# Patient Record
Sex: Male | Born: 1965 | Race: Black or African American | Hispanic: No | Smoking: Current every day smoker
Health system: Southern US, Community
[De-identification: ages and names within clinical notes are randomized; demographics above are authoritative.]

## PROBLEM LIST (undated history)

## (undated) DIAGNOSIS — I483 Typical atrial flutter: Secondary | ICD-10-CM

## (undated) DIAGNOSIS — Q211 Atrial septal defect, unspecified: Secondary | ICD-10-CM

## (undated) DIAGNOSIS — R011 Cardiac murmur, unspecified: Secondary | ICD-10-CM

## (undated) DIAGNOSIS — Z9289 Personal history of other medical treatment: Secondary | ICD-10-CM

## (undated) DIAGNOSIS — D1720 Benign lipomatous neoplasm of skin and subcutaneous tissue of unspecified limb: Secondary | ICD-10-CM

## (undated) DIAGNOSIS — I428 Other cardiomyopathies: Secondary | ICD-10-CM

## (undated) DIAGNOSIS — Q210 Ventricular septal defect: Secondary | ICD-10-CM

## (undated) DIAGNOSIS — Q249 Congenital malformation of heart, unspecified: Secondary | ICD-10-CM

## (undated) DIAGNOSIS — I059 Rheumatic mitral valve disease, unspecified: Secondary | ICD-10-CM

## (undated) DIAGNOSIS — I2510 Atherosclerotic heart disease of native coronary artery without angina pectoris: Secondary | ICD-10-CM

## (undated) DIAGNOSIS — D649 Anemia, unspecified: Secondary | ICD-10-CM

## (undated) DIAGNOSIS — R55 Syncope and collapse: Secondary | ICD-10-CM

## (undated) DIAGNOSIS — I00 Rheumatic fever without heart involvement: Secondary | ICD-10-CM

## (undated) DIAGNOSIS — I10 Essential (primary) hypertension: Secondary | ICD-10-CM

## (undated) DIAGNOSIS — I499 Cardiac arrhythmia, unspecified: Secondary | ICD-10-CM

## (undated) HISTORY — DX: Syncope and collapse: R55

## (undated) HISTORY — PX: CARDIAC VALVE REPLACEMENT: SHX585

## (undated) HISTORY — DX: Other cardiomyopathies: I42.8

## (undated) HISTORY — PX: CARDIAC SURGERY: SHX584

## (undated) HISTORY — DX: Anemia, unspecified: D64.9

## (undated) HISTORY — DX: Cardiac murmur, unspecified: R01.1

## (undated) HISTORY — PX: ASD AND VSD REPAIR: SHX257

## (undated) HISTORY — DX: Atrial septal defect: Q21.1

## (undated) HISTORY — DX: Rheumatic fever without heart involvement: I00

## (undated) HISTORY — DX: Atherosclerotic heart disease of native coronary artery without angina pectoris: I25.10

## (undated) HISTORY — DX: Congenital malformation of heart, unspecified: Q24.9

## (undated) HISTORY — DX: Benign lipomatous neoplasm of skin and subcutaneous tissue of unspecified limb: D17.20

## (undated) HISTORY — PX: MITRAL VALVE REPAIR: SHX2039

## (undated) HISTORY — DX: Ventricular septal defect: Q21.0

## (undated) HISTORY — DX: Typical atrial flutter: I48.3

## (undated) HISTORY — DX: Rheumatic mitral valve disease, unspecified: I05.9

## (undated) HISTORY — DX: Personal history of other medical treatment: Z92.89

## (undated) HISTORY — DX: Atrial septal defect, unspecified: Q21.10

---

## 1998-02-19 ENCOUNTER — Emergency Department (HOSPITAL_COMMUNITY): Admission: EM | Admit: 1998-02-19 | Discharge: 1998-02-19 | Payer: Self-pay | Admitting: Emergency Medicine

## 1998-02-21 ENCOUNTER — Encounter: Admission: RE | Admit: 1998-02-21 | Discharge: 1998-02-21 | Payer: Self-pay | Admitting: Internal Medicine

## 1998-02-25 ENCOUNTER — Ambulatory Visit (HOSPITAL_COMMUNITY): Admission: RE | Admit: 1998-02-25 | Discharge: 1998-02-25 | Payer: Self-pay

## 1998-02-27 ENCOUNTER — Encounter: Admission: RE | Admit: 1998-02-27 | Discharge: 1998-02-27 | Payer: Self-pay | Admitting: Internal Medicine

## 1999-01-09 ENCOUNTER — Ambulatory Visit (HOSPITAL_COMMUNITY): Admission: RE | Admit: 1999-01-09 | Discharge: 1999-01-09 | Payer: Self-pay | Admitting: Internal Medicine

## 1999-03-25 DIAGNOSIS — I059 Rheumatic mitral valve disease, unspecified: Secondary | ICD-10-CM

## 1999-03-25 HISTORY — DX: Rheumatic mitral valve disease, unspecified: I05.9

## 2004-05-17 ENCOUNTER — Emergency Department (HOSPITAL_COMMUNITY): Admission: EM | Admit: 2004-05-17 | Discharge: 2004-05-18 | Payer: Self-pay | Admitting: Emergency Medicine

## 2004-05-27 ENCOUNTER — Emergency Department (HOSPITAL_COMMUNITY): Admission: EM | Admit: 2004-05-27 | Discharge: 2004-05-27 | Payer: Self-pay | Admitting: Emergency Medicine

## 2004-06-16 ENCOUNTER — Ambulatory Visit (HOSPITAL_COMMUNITY): Admission: RE | Admit: 2004-06-16 | Discharge: 2004-06-16 | Payer: Self-pay | Admitting: Otolaryngology

## 2004-08-19 ENCOUNTER — Emergency Department (HOSPITAL_COMMUNITY): Admission: EM | Admit: 2004-08-19 | Discharge: 2004-08-19 | Payer: Self-pay | Admitting: Emergency Medicine

## 2005-01-25 ENCOUNTER — Emergency Department (HOSPITAL_COMMUNITY): Admission: EM | Admit: 2005-01-25 | Discharge: 2005-01-25 | Payer: Self-pay | Admitting: Emergency Medicine

## 2006-12-18 ENCOUNTER — Emergency Department (HOSPITAL_COMMUNITY): Admission: EM | Admit: 2006-12-18 | Discharge: 2006-12-18 | Payer: Self-pay | Admitting: Emergency Medicine

## 2010-01-25 ENCOUNTER — Emergency Department (HOSPITAL_COMMUNITY): Admission: EM | Admit: 2010-01-25 | Discharge: 2010-01-26 | Payer: Self-pay | Admitting: Emergency Medicine

## 2010-05-24 ENCOUNTER — Emergency Department (HOSPITAL_BASED_OUTPATIENT_CLINIC_OR_DEPARTMENT_OTHER)
Admission: EM | Admit: 2010-05-24 | Discharge: 2010-05-24 | Payer: Self-pay | Source: Home / Self Care | Admitting: Emergency Medicine

## 2010-05-24 ENCOUNTER — Ambulatory Visit: Payer: Self-pay | Admitting: Diagnostic Radiology

## 2010-08-01 ENCOUNTER — Emergency Department (HOSPITAL_COMMUNITY)
Admission: EM | Admit: 2010-08-01 | Discharge: 2010-08-01 | Payer: Self-pay | Source: Home / Self Care | Admitting: Emergency Medicine

## 2010-10-19 LAB — URINALYSIS, ROUTINE W REFLEX MICROSCOPIC
Bilirubin Urine: NEGATIVE
Glucose, UA: NEGATIVE mg/dL
Hgb urine dipstick: NEGATIVE
Ketones, ur: NEGATIVE mg/dL
Nitrite: NEGATIVE
Protein, ur: NEGATIVE mg/dL
Specific Gravity, Urine: 1.012 (ref 1.005–1.030)
Urobilinogen, UA: 0.2 mg/dL (ref 0.0–1.0)
pH: 5 (ref 5.0–8.0)

## 2012-03-11 ENCOUNTER — Encounter: Payer: Self-pay | Admitting: Internal Medicine

## 2012-03-11 DIAGNOSIS — Z0000 Encounter for general adult medical examination without abnormal findings: Secondary | ICD-10-CM | POA: Insufficient documentation

## 2012-03-17 ENCOUNTER — Encounter: Payer: Self-pay | Admitting: Internal Medicine

## 2012-03-17 ENCOUNTER — Ambulatory Visit (INDEPENDENT_AMBULATORY_CARE_PROVIDER_SITE_OTHER): Payer: Self-pay | Admitting: Internal Medicine

## 2012-03-17 VITALS — BP 112/72 | HR 70 | Temp 97.5°F | Ht 69.0 in | Wt 142.2 lb

## 2012-03-17 DIAGNOSIS — R634 Abnormal weight loss: Secondary | ICD-10-CM

## 2012-03-17 DIAGNOSIS — Q211 Atrial septal defect: Secondary | ICD-10-CM | POA: Insufficient documentation

## 2012-03-17 DIAGNOSIS — Q210 Ventricular septal defect: Secondary | ICD-10-CM | POA: Insufficient documentation

## 2012-03-17 DIAGNOSIS — Q249 Congenital malformation of heart, unspecified: Secondary | ICD-10-CM

## 2012-03-17 DIAGNOSIS — I059 Rheumatic mitral valve disease, unspecified: Secondary | ICD-10-CM | POA: Insufficient documentation

## 2012-03-17 DIAGNOSIS — D1739 Benign lipomatous neoplasm of skin and subcutaneous tissue of other sites: Secondary | ICD-10-CM

## 2012-03-17 DIAGNOSIS — D1720 Benign lipomatous neoplasm of skin and subcutaneous tissue of unspecified limb: Secondary | ICD-10-CM

## 2012-03-17 DIAGNOSIS — R55 Syncope and collapse: Secondary | ICD-10-CM | POA: Insufficient documentation

## 2012-03-17 DIAGNOSIS — F17200 Nicotine dependence, unspecified, uncomplicated: Secondary | ICD-10-CM

## 2012-03-17 HISTORY — DX: Benign lipomatous neoplasm of skin and subcutaneous tissue of unspecified limb: D17.20

## 2012-03-17 NOTE — Patient Instructions (Addendum)
Your EKG was OK today Please go to XRAY in the Basement for the x-ray test at your convenience Please go to LAB in the Basement for the blood and/or urine tests to be done at your convenience Please return in 6 months, for further evaluation Please stop smoking

## 2012-03-17 NOTE — Assessment & Plan Note (Addendum)
ECG reviewed as per emr, ok to follow, will need echo but declines now due to cost

## 2012-03-17 NOTE — Progress Notes (Signed)
Subjective:    Patient ID: Terry Golden, male    DOB: 01/17/1966, 46 y.o.   MRN: [id number]  HPI Here as new pt;  C/o wt loss over 3-6 mo, usually wt over 150 but has lost to 142, and he and family concerned, and was told his heart may change in the future, and starting to have heart palpitations again, like had before 2000 surgury.  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  Father died of cardiac arrest just last month. Denies worsening depressive symptoms, suicidal ideation, or panic, has had some stress but not really needed tx before. Denies hyper or hypo thyroid symptoms such as voice, skin or hair change. Denies worsening reflux, dysphagia, abd pain, n/v, bowel change or blood, states "eat like a horse" and cant seem to gain wt.  Still smoking  - about 1/4-1/3 ppd, only occas ETOH, no other drug use.   Appetitie ok.   Past Medical History  Diagnosis Date  . Heart murmur   . Heart disease   . Rheumatic fever   . ASD (atrial septal defect)     large   . VSD (ventricular septal defect)     small  . Mitral valve disease   . Syncope     1999  . Congenital heart disease    Past Surgical History  Procedure Date  . Cardiac surgery 2000    primum ASD repair, repair of cleft mitral valve    reports that he has been smoking.  He has never used smokeless tobacco. He reports that he drinks alcohol. He reports that he does not use illicit drugs. family history includes Alcohol abuse in his other; Arthritis in his others; Diabetes in his sister; Heart disease in his others and sister; Hypertension in his other; Kidney disease in his other; Mental illness in his other and sister; and Stroke in his other. No Known Allergies No current outpatient prescriptions on file prior to visit.   Review of Systems Review of Systems  Constitutional: Negative for diaphoresis, activity change, appetite change.  HENT:  Negative for hearing loss, ear pain, facial swelling, mouth sores and neck stiffness.   Eyes: Negative for pain, redness and visual disturbance.  Respiratory: Negative for shortness of breath and wheezing.   Cardiovascular: Negative for chest pain and palpitations.  Gastrointestinal: Negative for diarrhea, blood in stool, abdominal distention and rectal pain.  Genitourinary: Negative for hematuria, flank pain and decreased urine volume.  Musculoskeletal: Negative for myalgias and joint swelling.  Skin: Negative for color change and wound.  Neurological: Negative for syncope and numbness.  Hematological: Negative for adenopathy.  Psychiatric/Behavioral: Negative for hallucinations, self-injury, decreased concentration and agitation.      Objective:   Physical Exam BP 112/72  Pulse 70  Temp 97.5 F (36.4 C) (Oral)  Ht 5\' 9"  (1.753 m)  Wt 142 lb 4 oz (64.524 kg)  BMI 21.01 kg/m2  SpO2 95% Physical Exam  VS noted Constitutional: Pt appears well-developed and well-nourished.  HENT: Head: Normocephalic.  Right Ear: External ear normal.  Left Ear: External ear normal.  Eyes: Conjunctivae and EOM are normal. Pupils are equal, round, and reactive to light.  Neck: Normal range of motion. Neck supple.  Cardiovascular: Normal rate and regular rhythm.   Pulmonary/Chest: Effort normal and breath sounds normal.  Abd:  Soft, NT, non-distended, + BS Neurological: Pt is alert. Not confused  Skin: Skin is  warm. No erythema.  Psychiatric: Pt behavior is normal. Thought content normal.     Assessment & Plan:

## 2012-03-19 NOTE — Assessment & Plan Note (Signed)
Unclear etiology, for labs and cxr,  to f/u any worsening symptoms or concerns

## 2012-03-19 NOTE — Assessment & Plan Note (Signed)
Noted right shoudler, benign,  to f/u any worsening symptoms or concerns

## 2012-03-19 NOTE — Assessment & Plan Note (Signed)
Urged to quit 

## 2012-04-12 ENCOUNTER — Emergency Department (HOSPITAL_COMMUNITY)
Admission: EM | Admit: 2012-04-12 | Discharge: 2012-04-12 | Disposition: A | Discharge status: Home / Self Care | Payer: Self-pay | Attending: Emergency Medicine | Admitting: Emergency Medicine

## 2012-04-12 ENCOUNTER — Encounter (HOSPITAL_COMMUNITY): Payer: Self-pay

## 2012-04-12 DIAGNOSIS — F17200 Nicotine dependence, unspecified, uncomplicated: Secondary | ICD-10-CM | POA: Insufficient documentation

## 2012-04-12 DIAGNOSIS — I059 Rheumatic mitral valve disease, unspecified: Secondary | ICD-10-CM | POA: Insufficient documentation

## 2012-04-12 DIAGNOSIS — H6120 Impacted cerumen, unspecified ear: Secondary | ICD-10-CM | POA: Insufficient documentation

## 2012-04-12 DIAGNOSIS — H6122 Impacted cerumen, left ear: Secondary | ICD-10-CM

## 2012-04-12 MED ORDER — DOCUSATE SODIUM 50 MG/5ML PO LIQD
50.0000 mg | Freq: Once | ORAL | Status: AC
  Administered 2012-04-12: 08:00:00 via ORAL
  Filled 2012-04-12: qty 10

## 2012-04-12 MED ORDER — ANTIPYRINE-BENZOCAINE 5.4-1.4 % OT SOLN
3.0000 [drp] | OTIC | Status: DC | PRN
  Administered 2012-04-12: 3 [drp] via OTIC
  Filled 2012-04-12: qty 10

## 2012-04-12 MED ORDER — NEOMYCIN-COLIST-HC-THONZONIUM 3.3-3-10-0.5 MG/ML OT SUSP
3.0000 [drp] | OTIC | Status: DC
  Administered 2012-04-12: 3 [drp] via OTIC
  Filled 2012-04-12: qty 5

## 2012-04-12 NOTE — ED Notes (Signed)
MD at bedside. 

## 2012-04-12 NOTE — ED Notes (Signed)
Describes noise as "water in ear that's clogged."

## 2012-04-12 NOTE — ED Notes (Signed)
Pt reports he got hit accidentally a car door at 1500. Pt hears "steady, static sound." Pt had pain around ear. Reports no discharge. AAOx4.

## 2012-04-12 NOTE — ED Notes (Signed)
Colace placed in lt ear.very painful to pt

## 2012-04-12 NOTE — ED Notes (Signed)
MD left bedside

## 2012-04-12 NOTE — ED Notes (Signed)
Pt states that he was struck in the left side of head by a car door yesterday afternoon when he was getting out of the car.  Pt states now he can not hear out of his left ear and states has "ringing" and "gushing"

## 2012-04-12 NOTE — ED Provider Notes (Signed)
History     CSN: [id number]  Arrival date & time 04/12/12  4782   First MD Initiated Contact with Patient 04/12/12 [phone]      Chief Complaint  Patient presents with  . Otalgia    (Consider location/radiation/quality/duration/timing/severity/associated sxs/prior treatment) HPI  Patient states yesterday he was shutting the car door and hit the left side of his head. He states a few hours later he felt like he had water in his ear and he could hear. He states he had mild pain earlier today but not now. He also has some tenderness to left side of his face. He denies any bleeding from his ear canal. He states he uses Q tip daily cleanings ears appear   Cardiologist [place]  Past Medical History  Diagnosis Date  . Heart murmur   . Heart disease   . Rheumatic fever   . ASD (atrial septal defect)     large   . VSD (ventricular septal defect)     small  . Mitral valve disease   . Syncope     1999  . Congenital heart disease   . 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 Cardiac surgery 2000    primum ASD repair, repair of cleft mitral valve    Family History  Problem Relation Age of Onset  . Diabetes Sister   . Mental illness Sister   . Heart disease Sister   . Arthritis Other   . Heart disease Other   . Hypertension Other   . Alcohol abuse Other   . Arthritis Other   . Heart disease Other   . Stroke Other   . Mental illness Other   . Kidney disease Other     History  Substance Use Topics  . Smoking status: Current Everyday Smoker  . Smokeless tobacco: Never Used  . Alcohol Use: Yes     occasional social   Employed   Review of Systems  All other systems reviewed and are negative.    Allergies  Tensilon  Home Medications  No current outpatient prescriptions on file.  BP 128/82  Pulse 92  Temp 98.4 F (36.9 C) (Oral)  Resp 20  SpO2 100%  Vital signs normal    Physical Exam  Nursing note and vitals  reviewed. Constitutional: He is oriented to person, place, and time. He appears well-developed and well-nourished.  Non-toxic appearance. He does not appear ill. No distress.  HENT:  Head: Normocephalic and atraumatic.  Nose: Nose normal. No mucosal edema or rhinorrhea.  Mouth/Throat: Oropharynx is clear and moist and mucous membranes are normal. No dental abscesses or uvula swelling.       Patient has a lot of wax in his right ear canal however I can't see the TM and it is normal. Patient's left TM is obscured by a cerumen impaction. He is tender to palpation over the left zygoma however there is no swelling, deformity or bruising seen.  Eyes: Conjunctivae and EOM are normal. Pupils are equal, round, and reactive to light.  Neck: Normal range of motion and full passive range of motion without pain. Neck supple.  Pulmonary/Chest: Effort normal. No respiratory distress. He has no rhonchi. He exhibits no crepitus.  Abdominal: Normal appearance and bowel sounds are normal.  Musculoskeletal: Normal range of motion. He exhibits no edema and no tenderness.       Moves all extremities well.   Neurological: He is  alert and oriented to person, place, and time. He has normal strength. No cranial nerve deficit.  Skin: Skin is warm, dry and intact. No rash noted. No erythema. No pallor.  Psychiatric: He has a normal mood and affect. His speech is normal and behavior is normal. His mood appears not anxious.    ED Course  Procedures (including critical care time)   Medications  antipyrine-benzocaine (AURALGAN) otic solution 3-4 drop (not administered)  neomycin-colistin-hydrocortisone-thonzonium (CORTISPORIN TC) otic suspension 3 drop (not administered)  docusate (COLACE) 50 MG/5ML liquid 50 mg (  Oral Given 04/12/12 0741)      Ears rechecked at 09 40. His left ear canal is now almost free of wax. There is an abrasion of the posterior ear canal probably from the irrigation. His TM is intact without  being opague, redness or fluid behind the eardrum.    1. Impacted cerumen of left ear    Plan discharge  Devoria Albe, MD, FACEP    MDM          Ward Givens, MD 04/12/12 [phone]

## 2012-05-15 ENCOUNTER — Encounter (HOSPITAL_COMMUNITY): Payer: Self-pay | Admitting: Emergency Medicine

## 2012-05-15 ENCOUNTER — Emergency Department (HOSPITAL_COMMUNITY)
Admission: EM | Admit: 2012-05-15 | Discharge: 2012-05-15 | Disposition: A | Discharge status: Home / Self Care | Payer: Self-pay | Attending: Emergency Medicine | Admitting: Emergency Medicine

## 2012-05-15 DIAGNOSIS — Q2111 Secundum atrial septal defect: Secondary | ICD-10-CM | POA: Insufficient documentation

## 2012-05-15 DIAGNOSIS — H7290 Unspecified perforation of tympanic membrane, unspecified ear: Secondary | ICD-10-CM | POA: Insufficient documentation

## 2012-05-15 DIAGNOSIS — Q210 Ventricular septal defect: Secondary | ICD-10-CM | POA: Insufficient documentation

## 2012-05-15 DIAGNOSIS — I059 Rheumatic mitral valve disease, unspecified: Secondary | ICD-10-CM | POA: Insufficient documentation

## 2012-05-15 DIAGNOSIS — Q211 Atrial septal defect: Secondary | ICD-10-CM | POA: Insufficient documentation

## 2012-05-15 DIAGNOSIS — H7292 Unspecified perforation of tympanic membrane, left ear: Secondary | ICD-10-CM

## 2012-05-15 MED ORDER — IBUPROFEN 800 MG PO TABS: 800.0000 mg | ORAL_TABLET | Freq: Three times a day (TID) | ORAL | Status: DC

## 2012-05-15 NOTE — ED Notes (Signed)
Patient given discharge instructions, information, prescriptions, and diet order. Patient states that they adequately understand discharge information given and to return to ED if symptoms return or worsen.     

## 2012-05-15 NOTE — ED Notes (Signed)
Pt states he was here last month for left ear pain and difficulty hearing.  Pt states he had his ear irrigated and was given ear drops. Pt stopped using ear drops because it hurt every time he put them in. Pt states he is still having difficulty hearing out of left ear; denies pain, states it just feels like there is water in his ear.

## 2012-05-16 ENCOUNTER — Encounter: Payer: Self-pay | Admitting: Internal Medicine

## 2012-05-16 ENCOUNTER — Other Ambulatory Visit (INDEPENDENT_AMBULATORY_CARE_PROVIDER_SITE_OTHER): Payer: Self-pay

## 2012-05-16 DIAGNOSIS — R634 Abnormal weight loss: Secondary | ICD-10-CM

## 2012-05-16 LAB — CBC WITH DIFFERENTIAL/PLATELET
Basophils Absolute: 0 10*3/uL (ref 0.0–0.1)
Basophils Relative: 0.3 % (ref 0.0–3.0)
Eosinophils Absolute: 0.8 10*3/uL — ABNORMAL HIGH (ref 0.0–0.7)
Eosinophils Relative: 9.8 % — ABNORMAL HIGH (ref 0.0–5.0)
HCT: 45.6 % (ref 39.0–52.0)
Hemoglobin: 14.8 g/dL (ref 13.0–17.0)
Lymphocytes Relative: 21.4 % (ref 12.0–46.0)
Lymphs Abs: 1.8 10*3/uL (ref 0.7–4.0)
MCHC: 32.4 g/dL (ref 30.0–36.0)
MCV: 99.8 fl (ref 78.0–100.0)
Monocytes Absolute: 0.5 10*3/uL (ref 0.1–1.0)
Monocytes Relative: 5.9 % (ref 3.0–12.0)
Neutro Abs: 5.2 10*3/uL (ref 1.4–7.7)
Neutrophils Relative %: 62.6 % (ref 43.0–77.0)
Platelets: 217 10*3/uL (ref 150.0–400.0)
RBC: 4.57 Mil/uL (ref 4.22–5.81)
RDW: 13.4 % (ref 11.5–14.6)
WBC: 8.4 10*3/uL (ref 4.5–10.5)

## 2012-05-16 LAB — BASIC METABOLIC PANEL
BUN: 11 mg/dL (ref 6–23)
CO2: 27 mEq/L (ref 19–32)
Calcium: 9 mg/dL (ref 8.4–10.5)
Chloride: 105 mEq/L (ref 96–112)
Creatinine, Ser: 1 mg/dL (ref 0.4–1.5)
GFR: 108.08 mL/min (ref >60.00)
Glucose, Bld: 92 mg/dL (ref 70–99)
Potassium: 4.4 mEq/L (ref 3.5–5.1)
Sodium: 139 mEq/L (ref 135–145)

## 2012-05-16 LAB — LIPID PANEL
Cholesterol: 145 mg/dL (ref 0–200)
HDL: 44.7 mg/dL (ref >39.00)
LDL Cholesterol: 93 mg/dL (ref 0–99)
Total CHOL/HDL Ratio: 3
Triglycerides: 39 mg/dL (ref 0.0–149.0)
VLDL: 7.8 mg/dL (ref 0.0–40.0)

## 2012-05-16 LAB — URINALYSIS, ROUTINE W REFLEX MICROSCOPIC
Bilirubin Urine: NEGATIVE
Hgb urine dipstick: NEGATIVE
Ketones, ur: NEGATIVE
Nitrite: NEGATIVE
Specific Gravity, Urine: 1.01 (ref 1.000–1.030)
Total Protein, Urine: NEGATIVE
Urine Glucose: NEGATIVE
Urobilinogen, UA: 0.2 (ref 0.0–1.0)
pH: 6 (ref 5.0–8.0)

## 2012-05-16 LAB — TSH: TSH: 0.97 u[IU]/mL (ref 0.35–5.50)

## 2012-05-16 LAB — HEPATIC FUNCTION PANEL
ALT: 9 U/L (ref 0–53)
AST: 16 U/L (ref 0–37)
Albumin: 3.6 g/dL (ref 3.5–5.2)
Alkaline Phosphatase: 55 U/L (ref 39–117)
Bilirubin, Direct: 0.1 mg/dL (ref 0.0–0.3)
Total Bilirubin: 0.6 mg/dL (ref 0.3–1.2)
Total Protein: 7.1 g/dL (ref 6.0–8.3)

## 2012-05-16 NOTE — ED Provider Notes (Signed)
Medical screening examination/treatment/procedure(s) were conducted as a shared visit with non-physician practitioner(s) and myself.  I personally evaluated the patient during the encounter  Pt seen and has left OM perforation  Ethelda Chick, MD 05/16/12 [phone]

## 2012-05-16 NOTE — ED Provider Notes (Signed)
History     CSN: [id number]  Arrival date & time 05/15/12  1410   First MD Initiated Contact with Patient 05/15/12 1416      Chief Complaint  Patient presents with  . Otalgia    (Consider location/radiation/quality/duration/timing/severity/associated sxs/prior treatment) HPI Comments: Patient presents to the ED with one month history of otalgia. Patient seen on 05/12/12 for same complaint and at that time was diagnosed with otitis externa and given drops. He states that the nurse irrigated his ears and told him that "she had ruptured someone's eardrum before". He describes the pain as 8/10 with difficulty hearing. Denies fever or chills. Denies ear drainage.  The history is provided by the patient. No language interpreter was used.    Past Medical History  Diagnosis Date  . Heart murmur   . Heart disease   . Rheumatic fever   . ASD (atrial septal defect)     large   . VSD (ventricular septal defect)     small  . Mitral valve disease   . Syncope     1999  . Congenital heart disease   . 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 Cardiac surgery 2000    primum ASD repair, repair of cleft mitral valve    Family History  Problem Relation Age of Onset  . Diabetes Sister   . Mental illness Sister   . Heart disease Sister   . Arthritis Other   . Heart disease Other   . Hypertension Other   . Alcohol abuse Other   . Arthritis Other   . Heart disease Other   . Stroke Other   . Mental illness Other   . Kidney disease Other     History  Substance Use Topics  . Smoking status: Current Every Day Smoker  . Smokeless tobacco: Never Used  . Alcohol Use: Yes     occasional social      Review of Systems  Constitutional: Negative for fever and chills.  HENT: Positive for hearing loss and ear pain. Negative for ear discharge.     Allergies  Tensilon  Home Medications   Current Outpatient Rx  Name Route Sig Dispense  Refill  . ANTIPYRINE-BENZOCAINE 5.4-1.4 % OT SOLN Left Ear Place 3 drops into the left ear every 2 (two) hours as needed. PAIN    . NEOMYCIN-COLIST-HC-THONZONIUM 3.10-03-08-0.5 MG/ML OT SUSP Left Ear Place 3 drops into the left ear 4 (four) times daily.    . IBUPROFEN 800 MG PO TABS Oral Take 1 tablet (800 mg total) by mouth 3 (three) times daily. 21 tablet 0    BP 121/96  Pulse 63  Temp 98.5 F (36.9 C) (Oral)  Resp 16  SpO2 100%  Physical Exam  Constitutional: He appears well-developed and well-nourished. No distress.  HENT:  Head: Normocephalic and atraumatic.  Right Ear: Hearing, tympanic membrane, external ear and ear canal normal.  Left Ear: There is drainage and tenderness. Tympanic membrane is perforated. Decreased hearing is noted.  Ears:  Eyes: Conjunctivae normal and EOM are normal. No scleral icterus.  Neck: Normal range of motion. Neck supple.  Cardiovascular: Normal rate, regular rhythm and normal heart sounds.   Pulmonary/Chest: Effort normal and breath sounds normal.  Abdominal: Soft. Bowel sounds are normal. There is no tenderness.  Neurological: He is alert.  Skin: Skin is warm and dry.    ED Course  Procedures (including critical care time)  Labs Reviewed - No data to display No results found.   1. Eardrum rupture, left       MDM  Patient presented 1 month after being seen in this ED for otalgia. Patient states that he was unable to use drops to complete course because of pain. Patient seen with Dr. Karma Ganja who informed patient that his TM perforation was small enough to heal. Patient instructed by myself that if his symptoms have not improved in 5 days he should follow-up with our ENT on call. Patient discharged with return precautions. No red flags for malignant otitis media, mastoiditis, or cholesteatoma.         Pixie Casino, PA-C 05/16/12 [phone]

## 2012-09-19 ENCOUNTER — Ambulatory Visit: Payer: Self-pay | Admitting: Internal Medicine

## 2012-09-19 DIAGNOSIS — Z0289 Encounter for other administrative examinations: Secondary | ICD-10-CM

## 2013-05-26 ENCOUNTER — Emergency Department (HOSPITAL_COMMUNITY)
Admission: EM | Admit: 2013-05-26 | Discharge: 2013-05-26 | Disposition: A | Discharge status: Home / Self Care | Payer: Self-pay | Attending: Emergency Medicine | Admitting: Emergency Medicine

## 2013-05-26 ENCOUNTER — Emergency Department (HOSPITAL_COMMUNITY): Payer: Self-pay

## 2013-05-26 DIAGNOSIS — F17200 Nicotine dependence, unspecified, uncomplicated: Secondary | ICD-10-CM | POA: Insufficient documentation

## 2013-05-26 DIAGNOSIS — X500XXA Overexertion from strenuous movement or load, initial encounter: Secondary | ICD-10-CM | POA: Insufficient documentation

## 2013-05-26 DIAGNOSIS — M20019 Mallet finger of unspecified finger(s): Secondary | ICD-10-CM | POA: Insufficient documentation

## 2013-05-26 DIAGNOSIS — Z8679 Personal history of other diseases of the circulatory system: Secondary | ICD-10-CM | POA: Insufficient documentation

## 2013-05-26 DIAGNOSIS — R011 Cardiac murmur, unspecified: Secondary | ICD-10-CM | POA: Insufficient documentation

## 2013-05-26 DIAGNOSIS — Y929 Unspecified place or not applicable: Secondary | ICD-10-CM | POA: Insufficient documentation

## 2013-05-26 DIAGNOSIS — M20012 Mallet finger of left finger(s): Secondary | ICD-10-CM

## 2013-05-26 DIAGNOSIS — Y9389 Activity, other specified: Secondary | ICD-10-CM | POA: Insufficient documentation

## 2013-05-26 DIAGNOSIS — Z872 Personal history of diseases of the skin and subcutaneous tissue: Secondary | ICD-10-CM | POA: Insufficient documentation

## 2013-05-26 NOTE — ED Notes (Signed)
John, Ortho tech, called for splint.

## 2013-05-26 NOTE — ED Provider Notes (Signed)
CSN: [id number]     Arrival date & time 05/26/13  1200 History  This chart was scribed for [name], PA working with Suzi Roots, MD by Quintella Reichert, ED Scribe. This patient was seen in room WTR5/WTR5 and the patient's care was started at 1:24 PM.   Chief Complaint  Patient presents with  . Finger dislocation     The history is provided by the patient. No language interpreter was used.    HPI Comments: Terry Golden. is a 47 y.o. male who presents to the Emergency Department complaining of finger pain that began this morning.  Pt states that he was working this morning and suddenly noticed that his finger was contracted and he was unable to extend the finger fully.  He denies injuries prior to this.  Later he developed mild pain and swelling to the finger which has been constant since then.  He denies pain to any other area and has no other complaints at this time.    Past Medical History  Diagnosis Date  . Heart murmur   . Heart disease   . Rheumatic fever   . ASD (atrial septal defect)     large   . VSD (ventricular septal defect)     small  . Mitral valve disease   . Syncope     1999  . Congenital heart disease   . 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 Cardiac surgery  2000    primum ASD repair, repair of cleft mitral valve    Family History  Problem Relation Age of Onset  . Diabetes Sister   . Mental illness Sister   . Heart disease Sister   . Arthritis Other   . Heart disease Other   . Hypertension Other   . Alcohol abuse Other   . Arthritis Other   . Heart disease Other   . Stroke Other   . Mental illness Other   . Kidney disease Other     History  Substance Use Topics  . Smoking status: Current Every Day Smoker  . Smokeless tobacco: Never Used  . Alcohol Use: Yes     Comment: occasional social     Review of Systems  All other systems reviewed and are  negative.     Allergies  Tensilon  Home Medications   Current Outpatient Rx  Name  Route  Sig  Dispense  Refill  . aspirin 325 MG tablet   Oral   Take 325 mg by mouth every 4 (four) hours as needed for pain (headache).          BP 147/94  Pulse 101  Temp(Src) 97.9 F (36.6 C) (Oral)  Resp 16  SpO2 99%  Physical Exam  Nursing note and vitals reviewed. Constitutional: He is oriented to person, place, and time. He appears well-developed and well-nourished. No distress.  HENT:  Head: Normocephalic and atraumatic.  Eyes: EOM are normal.  Neck: Neck supple. No tracheal deviation present.  Cardiovascular: Normal rate.   Pulmonary/Chest: Effort normal. No respiratory distress.  Musculoskeletal:  Left ring finger DIP in a flexed position, no tenderness to palpation of finger  Neurological: He is alert and oriented to person, place, and time.  Skin: Skin is warm and dry.  Psychiatric: He has a normal mood and affect. His behavior is normal.    ED Course  Procedures (including critical care time)  DIAGNOSTIC  STUDIES: Oxygen Saturation is 99% on room air, normal by my interpretation.    COORDINATION OF CARE: 1:26 PM-Discussed treatment plan which includes f/u with hand specialist with pt at bedside and pt agreed to plan.    Labs Review Labs Reviewed - No data to display  Imaging Review Dg Finger Ring Left  05/26/2013   CLINICAL DATA:  Left ring finger pain and swelling, no known injury  EXAM: LEFT RING FINGER 2+V  COMPARISON:  None.  FINDINGS: There is no evidence of fracture or dislocation. There is no evidence of arthropathy or other focal bone abnormality. Mild diffuse soft tissue swelling.  IMPRESSION: Negative.   Electronically Signed   By: Malachy Moan M.D.   On: 05/26/2013 13:01    EKG Interpretation   None       MDM   1. Mallet finger of left hand     Patient has mallet finger of left ring finger. Patient will have a static finger splint and be  discharged with Hand follow up.     I personally performed the services described in this documentation, which was scribed in my presence. The recorded information has been reviewed and is accurate.    [name], [place] 05/26/13 1953

## 2013-05-26 NOTE — ED Notes (Signed)
Pt reports 4th left finger dislocation that happened this morning. Pt states he is unsure of how it happened.

## 2013-05-26 NOTE — Progress Notes (Signed)
[facility] provided pt with a list of primary care resources.  °

## 2013-05-30 NOTE — ED Provider Notes (Signed)
Medical screening examination/treatment/procedure(s) were performed by non-physician practitioner and as supervising physician I was immediately available for consultation/collaboration.  EKG Interpretation   None         Suzi Roots, MD 05/30/13 1255

## 2014-05-09 ENCOUNTER — Emergency Department (HOSPITAL_COMMUNITY)
Admission: EM | Admit: 2014-05-09 | Discharge: 2014-05-09 | Disposition: A | Discharge status: Home / Self Care | Payer: Self-pay | Attending: Emergency Medicine | Admitting: Emergency Medicine

## 2014-05-09 ENCOUNTER — Encounter (HOSPITAL_COMMUNITY): Payer: Self-pay | Admitting: Emergency Medicine

## 2014-05-09 DIAGNOSIS — Z720 Tobacco use: Secondary | ICD-10-CM | POA: Insufficient documentation

## 2014-05-09 DIAGNOSIS — Z8679 Personal history of other diseases of the circulatory system: Secondary | ICD-10-CM | POA: Insufficient documentation

## 2014-05-09 DIAGNOSIS — H7292 Unspecified perforation of tympanic membrane, left ear: Secondary | ICD-10-CM | POA: Insufficient documentation

## 2014-05-09 DIAGNOSIS — R011 Cardiac murmur, unspecified: Secondary | ICD-10-CM | POA: Insufficient documentation

## 2014-05-09 DIAGNOSIS — Z8774 Personal history of (corrected) congenital malformations of heart and circulatory system: Secondary | ICD-10-CM | POA: Insufficient documentation

## 2014-05-09 NOTE — ED Notes (Signed)
Pt states that a "nurse gave him a hole in his ear" yesterday here.  States his ear has been draining and he has been having pain.

## 2014-05-09 NOTE — Discharge Instructions (Signed)
Return to the emergency room with worsening of symptoms, new symptoms or with symptoms that are concerning, especially fevers, increased pain, nausea, vomiting diarrhea or pus or blood from ear. Call to make an appointment with ENT. Schedule an appointment as soon as you can. No water in your ear. Keep your ear covered during showering. No swimming   Eardrum Perforation The eardrum is a thin, round tissue inside the ear that separates the ear canal from the middle ear. This is the tissue that detects sound and enables you to hear. The eardrum can be punctured or torn (perforated). Eardrums generally heal without help and with little or no permanent hearing loss. CAUSES   Sudden pressure changes that happen in situations like scuba diving or flying in an airplane.  Foreign objects in the ear.  Inserting a cotton-tipped swab in the ear.  Loud noise.  Trauma to the ear. SYMPTOMS   Hearing loss.  Ear pain.  Ringing in the ears.  Discharge or bleeding from the ear.  Dizziness.  Vomiting.  Facial paralysis. HOME CARE INSTRUCTIONS   Keep your ear dry, as this improves healing. Swimming, diving, and showers are not allowed until healing is complete. While bathing, protect the ear by placing a piece of cotton covered with petroleum jelly in the outer ear canal.  Only take over-the-counter or prescription medicines for pain, discomfort, or fever as directed by your caregiver.  Blow your nose gently. Forceful blowing increases the pressure in the middle ear and may cause further injury or delay healing.  Resume normal activities, such as showering, when the perforation has healed. Your caregiver can let you know when this has occurred.  Talk to your caregiver before flying on an airplane. Air travel is generally allowed with a perforated eardrum.  If your caregiver has given you a follow-up appointment, it is very important to keep that appointment. Failure to keep the appointment  could result in a chronic or permanent injury, pain, hearing loss, and disability. SEEK IMMEDIATE MEDICAL CARE IF:   You have bleeding or pus coming from your ear.  You have problems with balance, dizziness, nausea, or vomiting.  You develop increased pain.  You have a fever. MAKE SURE YOU:   Understand these instructions.  Will watch your condition.  Will get help right away if you are not doing well or get worse. Document Released: 07/17/2000 Document Revised: 10/12/2011 Document Reviewed: 07/19/2008 [facility] Patient Information 2015 [place], [place]. This information is not intended to replace advice given to you by your health care provider. Make sure you discuss any questions you have with your health care provider.

## 2014-05-09 NOTE — ED Provider Notes (Signed)
CSN: [id number]     Arrival date & time 05/09/14  1823 History   None    This chart was scribed for non-physician practitioner, Al Corpus PA-C working with No att. providers found by Forrestine Him, ED Scribe. This patient was seen in room WTR6/WTR6 and the patient's care was started at 10:44 PM.   Chief Complaint  Patient presents with  . Otalgia   The history is provided by the patient. No language interpreter was used.    HPI Comments: Terry Golden. is a 48 y.o. male who presents to the Emergency Department complaining of constant, moderate L sided otalgia onset yesterday. Pt states he has noted a significant amount of clear fluid coming from his ear. He also reports a muffling sound to the hear. Pt reports injury to the inside of his ear secondary to "a nurse looking in my ear while in the hospital" sustained approximately 1 year ago. He has tried OTC Excedrin with mild improvement for symptoms. [name] denies any fever, HA, chills, congestion, cough, or rhinorrhea. No recent air plane rides or swimming or SCUBA diving. Pt current works as a Air traffic controller. Pt with known allergy to Tensilon.  Past Medical History  Diagnosis Date  . Heart murmur   . Heart disease   . Rheumatic fever   . ASD (atrial septal defect)     large   . VSD (ventricular septal defect)     small  . Mitral valve disease   . Syncope     1999  . Congenital heart disease   . 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    right   Past Surgical History  Procedure Laterality Date  . Cardiac surgery  2000    primum ASD repair, repair of cleft mitral valve   Family History  Problem Relation Age of Onset  . Diabetes Sister   . Mental illness Sister   . Heart disease Sister   . Arthritis Other   . Heart disease Other   . Hypertension Other   . Alcohol abuse Other   . Arthritis Other   . Heart disease Other   . Stroke Other   . Mental illness Other   . Kidney disease Other    History  Substance  Use Topics  . Smoking status: Current Every Day Smoker  . Smokeless tobacco: Never Used  . Alcohol Use: Yes     Comment: occasional social    Review of Systems  Constitutional: Negative for fever and chills.  HENT: Positive for ear pain and tinnitus. Negative for congestion, rhinorrhea and sore throat.   Respiratory: Negative for cough.   All other systems reviewed and are negative.     Allergies  Tensilon  Home Medications   Prior to Admission medications   Medication Sig Start Date End Date Taking? Authorizing Provider  aspirin 325 MG tablet Take 325 mg by mouth every 4 (four) hours as needed for pain (headache).    Historical Provider, MD   Triage Vitals: BP 136/75  Pulse 65  Temp(Src) 98.2 F (36.8 C) (Oral)  Resp 16  SpO2 96%   Physical Exam  Nursing note and vitals reviewed. Constitutional: He appears well-developed and well-nourished. No distress.  HENT:  Head: Normocephalic and atraumatic.  Right Ear: No drainage. Tympanic membrane is not injected and not perforated.  Left Ear: There is drainage. Tympanic membrane is perforated. Decreased hearing is noted.  No L sided mastoid tenderness  Eyes: Conjunctivae are normal. Right eye exhibits  no discharge. Left eye exhibits no discharge.  Neck: Neck supple.  Pulmonary/Chest: Effort normal. No respiratory distress.  Lymphadenopathy:    He has no cervical adenopathy.  Neurological: He is alert. Coordination normal.  Skin: He is not diaphoretic.  Psychiatric: He has a normal mood and affect. His behavior is normal.    ED Course  Procedures (including critical care time)  DIAGNOSTIC STUDIES: Oxygen Saturation is 100% on RA, Normal by my interpretation.    COORDINATION OF CARE: 10:44 PM-Discussed treatment plan with pt at bedside and pt agreed to plan.     Labs Review Labs Reviewed - No data to display  Imaging Review No results found.   EKG Interpretation None      MDM   Final diagnoses:  Ear  drum perforation, left   Patient with ruptured TM on left. Patient without good history for otitis media or URI to cause the rupture. No trauma, recent flight or swimming. Etiology unclear. Drainage from ear nonpurulent or infected. Discussed how TM can heal on their own but recommend follow up with ENT. Patient to call for an appointment as soon as possible. Info provided. Pt advised to keep all fluids out of ear and cover during showering.   Discussed return precautions with patient. Discussed all results and patient verbalizes understanding and agrees with plan.  I personally performed the services described in this documentation, which was scribed in my presence. The recorded information has been reviewed and is accurate.   Pura Spice, PA-C 05/09/14 2248

## 2014-05-10 NOTE — ED Provider Notes (Signed)
Medical screening examination/treatment/procedure(s) were performed by non-physician practitioner and as supervising physician I was immediately available for consultation/collaboration.   EKG Interpretation None        Pamella Pert, MD 05/10/14 1636

## 2014-06-25 ENCOUNTER — Encounter (HOSPITAL_COMMUNITY): Payer: Self-pay | Admitting: Emergency Medicine

## 2014-06-25 ENCOUNTER — Emergency Department (HOSPITAL_COMMUNITY)
Admission: EM | Admit: 2014-06-25 | Discharge: 2014-06-25 | Disposition: A | Discharge status: Home / Self Care | Payer: Self-pay | Attending: Emergency Medicine | Admitting: Emergency Medicine

## 2014-06-25 DIAGNOSIS — Z8679 Personal history of other diseases of the circulatory system: Secondary | ICD-10-CM | POA: Insufficient documentation

## 2014-06-25 DIAGNOSIS — Z8774 Personal history of (corrected) congenital malformations of heart and circulatory system: Secondary | ICD-10-CM | POA: Insufficient documentation

## 2014-06-25 DIAGNOSIS — H7292 Unspecified perforation of tympanic membrane, left ear: Secondary | ICD-10-CM | POA: Insufficient documentation

## 2014-06-25 DIAGNOSIS — R011 Cardiac murmur, unspecified: Secondary | ICD-10-CM | POA: Insufficient documentation

## 2014-06-25 DIAGNOSIS — H9212 Otorrhea, left ear: Secondary | ICD-10-CM

## 2014-06-25 DIAGNOSIS — Z872 Personal history of diseases of the skin and subcutaneous tissue: Secondary | ICD-10-CM | POA: Insufficient documentation

## 2014-06-25 DIAGNOSIS — Z7982 Long term (current) use of aspirin: Secondary | ICD-10-CM | POA: Insufficient documentation

## 2014-06-25 DIAGNOSIS — Z720 Tobacco use: Secondary | ICD-10-CM | POA: Insufficient documentation

## 2014-06-25 MED ORDER — AMOXICILLIN 500 MG PO CAPS: 500.0000 mg | ORAL_CAPSULE | Freq: Three times a day (TID) | ORAL | Status: DC

## 2014-06-25 NOTE — ED Provider Notes (Signed)
CSN: [id number]     Arrival date & time 06/25/14  1620 History  This chart was scribed for non-physician practitioner, Comer Locket, PA-C working with Artis Delay, MD by Frederich Balding, ED scribe. This patient was seen in room [place] and the patient's care was started at 4:55 PM.   Chief Complaint  Patient presents with  . Otalgia   The history is provided by the patient. No language interpreter was used.    HPI Comments: Terry Branch. is a 48 y.o. male who presents to the Emergency Department complaining of left ear pain with associated brown drainage that started one month ago. States he was evaluated for his symptoms when they started and was told he had a ruptured eardrum. He states his ear is now starting to drain more and pain is starting to radiate. Reports some hearing loss. Pt has an appointment with ENT in one week. Denies history of blood disorders and pt is not immunocompromised.   Past Medical History  Diagnosis Date  . Heart murmur   . Heart disease   . Rheumatic fever   . ASD (atrial septal defect)     large   . VSD (ventricular septal defect)     small  . Mitral valve disease   . Syncope     1999  . Congenital heart disease   . 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    right   Past Surgical History  Procedure Laterality Date  . Cardiac surgery  2000    primum ASD repair, repair of cleft mitral valve   Family History  Problem Relation Age of Onset  . Diabetes Sister   . Mental illness Sister   . Heart disease Sister   . Arthritis Other   . Heart disease Other   . Hypertension Other   . Alcohol abuse Other   . Arthritis Other   . Heart disease Other   . Stroke Other   . Mental illness Other   . Kidney disease Other    History  Substance Use Topics  . Smoking status: Current Every Day Smoker  . Smokeless tobacco: Never Used  . Alcohol Use: Yes     Comment: occasional social    Review of Systems  HENT: Positive for ear discharge, ear pain and  hearing loss.   All other systems reviewed and are negative.  Allergies  Tensilon  Home Medications   Prior to Admission medications   Medication Sig Start Date End Date Taking? Authorizing Provider  aspirin 325 MG tablet Take 325 mg by mouth every 4 (four) hours as needed for pain (headache).   Yes Historical Provider, MD  amoxicillin (AMOXIL) 500 MG capsule Take 1 capsule (500 mg total) by mouth 3 (three) times daily. 06/25/14   Viona Gilmore Aayush Gelpi, PA-C   BP 136/75 mmHg  Pulse 70  Temp(Src) 98.7 F (37.1 C) (Oral)  Resp 16  SpO2 96%   Physical Exam  Constitutional: He is oriented to person, place, and time. He appears well-developed and well-nourished. No distress.  HENT:  Head: Normocephalic and atraumatic.  Right Ear: External ear normal.  Left Ear: External ear normal.  Mouth/Throat: Oropharynx is clear and moist. No oropharyngeal exudate.  Purulent, foul smelling drainage from left ear. Unable to visualize TM. Right ear canal with cerumen impaction. No auricular tenderness. No mastoid tenderness. No mandibular tenderness.  No overt erythema noted to external canal. No evidence of skin break down.    Eyes: Conjunctivae and EOM are  normal. Pupils are equal, round, and reactive to light. Right eye exhibits no discharge. Left eye exhibits no discharge. No scleral icterus.  Neck: Normal range of motion. Neck supple. No tracheal deviation present.  Cardiovascular: Normal rate, regular rhythm and normal heart sounds.   Pulmonary/Chest: Effort normal and breath sounds normal. No respiratory distress. He has no wheezes. He has no rhonchi. He has no rales.  Musculoskeletal: Normal range of motion. He exhibits no edema.  Neurological: He is alert and oriented to person, place, and time.  Skin: Skin is warm and dry.  Psychiatric: He has a normal mood and affect. His behavior is normal.  Nursing note and vitals reviewed.   ED Course  Procedures (including critical care  time)  DIAGNOSTIC STUDIES: Oxygen Saturation is 99% on RA, normal by my interpretation.    COORDINATION OF CARE: 5:01 PM-Discussed treatment plan which includes antibiotic eardrops with pt at bedside and pt agreed to plan. Advised pt to keep his ENT appointment and follow up.   Labs Review Labs Reviewed - No data to display  Imaging Review No results found.   EKG Interpretation None     Irrigation completed in the room carefully with cotton swab. Left TM still appears perforated with copious otorrhea. Will DC with amoxicillin systemic and antibiotic until he can meet with ENT next week MDM  Vitals stable - WNL -afebrile Pt resting comfortably in ED. PE--not concerning further acute or emergent pathology. No evidence of mastoiditis or other malignant syndrome. Patient denies any facial pain, doubt involvement of sinuses. No evidence of otitis externa or other intra-canal infection.   Will DC with amoxicillin for prophylactic treatment until he can follow-up with his ENT next week for his regularly scheduled appointment. Discussed f/u with PCP and return precautions, pt very amenable to plan. Patient stable, in good condition and is appropriate for discharge   Final diagnoses:  Tympanic membrane perforation, left  Otorrhea, left      I personally performed the services described in this documentation, which was scribed in my presence. The recorded information has been reviewed and is accurate.  Viona Gilmore [place], PA-C 06/26/14 [place], MD 06/27/14 [phone]

## 2014-06-25 NOTE — ED Notes (Signed)
Pt states that he has had L sided ear pain x 1 month and brown drainage. States he has a burst ear drum. Alert and oriented.

## 2014-06-25 NOTE — Discharge Instructions (Signed)
Eardrum Perforation The eardrum is a thin, round tissue inside the ear. It allows you to hear. The eardrum can get torn (perforated). Eardrums often heal on their own. There is often little or no long-term hearing loss. HOME CARE   Keep your ear dry while it heals. Do not swim, dive, or take showers until your doctor says it is okay.  Before you take a bath, put petroleum jelly all over a cotton ball. Put the cotton ball in your ear. This will keep water out.  Only take medicines as told by your doctor.  Blow your nose gently.  Continue normal activities after your eardrum heals. Your doctor will tell you when your eardrum has healed.  Talk to your doctor before flying on an airplane.  Keep all doctor visits as told. This is important. GET HELP RIGHT AWAY IF:   You have blood or yellowish-white fluid (pus) coming from your ear.  You feel off balance.  You feel dizzy, sick to your stomach (nauseous), or you throw up (vomit).  You have more pain.  You have a fever. MAKE SURE YOU:   Understand these instructions.  Will watch your condition.  Will get help right away if you are not doing well or get worse. Document Released: 01/07/2010 Document Revised: 10/12/2011 Document Reviewed: 01/07/2010 [facility] Patient Information 2015 [place], [place]. This information is not intended to replace advice given to you by your health care provider. Make sure you discuss any questions you have with your health care provider.  Draining Ear Fluid (drainage) can come from your ear. This may be wax, yellowish-white fluid (pus), blood, or other fluids. An infection, injury, or irritation may cause fluid to drain from your ear.  HOME CARE  Only take medicine as told by your doctor. This may include ear drops.  Do not rub inside your ear with cotton-tipped swabs.  Do not swim until your doctor says it is okay.  Before you take a shower, cover a cotton ball with petroleum jelly. Put it in your  ear. This will keep water out.  Stay away from smoke.  Make sure your shots (vaccinations) are up to date.  Wash your hands well.  Keep all doctor visits as told. GET HELP RIGHT AWAY IF:   You have very bad ear pain or a headache.  You have a fever.  The patient is older than 3 months with a rectal temperature of 102F (38.9C) or higher.  The patient is 36 months old or younger with a rectal temperature of 100.110F (38C) or higher.  You throw up (vomit).  You feel dizzy.  You have twitching or shaking (seizure).  You have new hearing loss.  You have more fluid coming from the ear.  You have pain, a fever, or fluid drainage that does not get better within 48 hours of taking medicine.  You are more tired than normal. MAKE SURE YOU:   Understand these instructions.  Will watch your condition.  Will get help right away if you are not doing well or get worse. Document Released: 01/07/2010 Document Revised: 12/04/2013 Document Reviewed: 01/07/2010 [facility] Patient Information 2015 [place], [place]. This information is not intended to replace advice given to you by your health care provider. Make sure you discuss any questions you have with your health care provider.   You were evaluated in the ED today for your ear pain. There is no emergent cause for her symptoms at this time. You will need to take the antibiotics  as directed to prevent any infection from growing in your ear. Please follow-up with your ENT a your regularly scheduled appointment next week. Please return to ED if you begin to experience fevers, worsening symptoms, facial pain, eye pain or jaw pain.

## 2015-07-16 ENCOUNTER — Encounter (HOSPITAL_COMMUNITY): Payer: Self-pay | Admitting: Neurology

## 2015-07-16 ENCOUNTER — Emergency Department (HOSPITAL_COMMUNITY): Payer: Self-pay

## 2015-07-16 ENCOUNTER — Observation Stay (HOSPITAL_COMMUNITY)
Admission: EM | Admit: 2015-07-16 | Discharge: 2015-07-17 | Disposition: A | Discharge status: Home / Self Care | Payer: Self-pay | Attending: Internal Medicine | Admitting: Internal Medicine

## 2015-07-16 DIAGNOSIS — I361 Nonrheumatic tricuspid (valve) insufficiency: Secondary | ICD-10-CM | POA: Insufficient documentation

## 2015-07-16 DIAGNOSIS — Q233 Congenital mitral insufficiency: Secondary | ICD-10-CM | POA: Insufficient documentation

## 2015-07-16 DIAGNOSIS — Z951 Presence of aortocoronary bypass graft: Secondary | ICD-10-CM | POA: Insufficient documentation

## 2015-07-16 DIAGNOSIS — R0600 Dyspnea, unspecified: Secondary | ICD-10-CM | POA: Diagnosis present

## 2015-07-16 DIAGNOSIS — Z7982 Long term (current) use of aspirin: Secondary | ICD-10-CM | POA: Insufficient documentation

## 2015-07-16 DIAGNOSIS — Q211 Atrial septal defect: Principal | ICD-10-CM | POA: Insufficient documentation

## 2015-07-16 DIAGNOSIS — I371 Nonrheumatic pulmonary valve insufficiency: Secondary | ICD-10-CM | POA: Insufficient documentation

## 2015-07-16 DIAGNOSIS — R079 Chest pain, unspecified: Secondary | ICD-10-CM | POA: Insufficient documentation

## 2015-07-16 DIAGNOSIS — Q249 Congenital malformation of heart, unspecified: Secondary | ICD-10-CM

## 2015-07-16 DIAGNOSIS — R0602 Shortness of breath: Secondary | ICD-10-CM | POA: Insufficient documentation

## 2015-07-16 DIAGNOSIS — Z8249 Family history of ischemic heart disease and other diseases of the circulatory system: Secondary | ICD-10-CM | POA: Insufficient documentation

## 2015-07-16 DIAGNOSIS — R51 Headache: Secondary | ICD-10-CM | POA: Insufficient documentation

## 2015-07-16 DIAGNOSIS — I059 Rheumatic mitral valve disease, unspecified: Secondary | ICD-10-CM | POA: Insufficient documentation

## 2015-07-16 DIAGNOSIS — Z888 Allergy status to other drugs, medicaments and biological substances status: Secondary | ICD-10-CM | POA: Insufficient documentation

## 2015-07-16 DIAGNOSIS — F17210 Nicotine dependence, cigarettes, uncomplicated: Secondary | ICD-10-CM | POA: Insufficient documentation

## 2015-07-16 DIAGNOSIS — Q238 Other congenital malformations of aortic and mitral valves: Secondary | ICD-10-CM | POA: Insufficient documentation

## 2015-07-16 DIAGNOSIS — R55 Syncope and collapse: Secondary | ICD-10-CM | POA: Insufficient documentation

## 2015-07-16 DIAGNOSIS — R05 Cough: Secondary | ICD-10-CM | POA: Insufficient documentation

## 2015-07-16 DIAGNOSIS — Q210 Ventricular septal defect: Secondary | ICD-10-CM | POA: Insufficient documentation

## 2015-07-16 DIAGNOSIS — R42 Dizziness and giddiness: Secondary | ICD-10-CM | POA: Insufficient documentation

## 2015-07-16 LAB — BASIC METABOLIC PANEL
ANION GAP: 8 (ref 5–15)
BUN: 9 mg/dL (ref 6–20)
CO2: 26 mmol/L (ref 22–32)
CREATININE: 1.11 mg/dL (ref 0.61–1.24)
Calcium: 9.4 mg/dL (ref 8.9–10.3)
Chloride: 105 mmol/L (ref 101–111)
Glucose, Bld: 101 mg/dL — ABNORMAL HIGH (ref 65–99)
Potassium: 4 mmol/L (ref 3.5–5.1)
Sodium: 139 mmol/L (ref 135–145)

## 2015-07-16 LAB — I-STAT TROPONIN, ED: TROPONIN I, POC: 0 ng/mL (ref 0.00–0.08)

## 2015-07-16 LAB — CBC
HCT: 43 % (ref 39.0–52.0)
HEMOGLOBIN: 14.3 g/dL (ref 13.0–17.0)
MCH: 32.6 pg (ref 26.0–34.0)
MCHC: 33.3 g/dL (ref 30.0–36.0)
MCV: 97.9 fL (ref 78.0–100.0)
PLATELETS: 243 10*3/uL (ref 150–400)
RBC: 4.39 MIL/uL (ref 4.22–5.81)
RDW: 12.8 % (ref 11.5–15.5)
WBC: 5.3 10*3/uL (ref 4.0–10.5)

## 2015-07-16 LAB — TROPONIN I

## 2015-07-16 LAB — BRAIN NATRIURETIC PEPTIDE: B NATRIURETIC PEPTIDE 5: 8.3 pg/mL (ref 0.0–100.0)

## 2015-07-16 MED ORDER — NITROGLYCERIN 0.4 MG SL SUBL
0.4000 mg | SUBLINGUAL_TABLET | SUBLINGUAL | Status: DC | PRN
  Filled 2015-07-16: qty 1

## 2015-07-16 MED ORDER — ASPIRIN EC 325 MG PO TBEC
325.0000 mg | DELAYED_RELEASE_TABLET | Freq: Once | ORAL | Status: AC
Start: 2015-07-16 — End: 2015-07-16
  Administered 2015-07-16: 325 mg via ORAL
  Filled 2015-07-16: qty 1

## 2015-07-16 MED ORDER — SODIUM CHLORIDE 0.9 % IJ SOLN
3.0000 mL | Freq: Two times a day (BID) | INTRAMUSCULAR | Status: DC
  Administered 2015-07-16 – 2015-07-17 (×3): 3 mL via INTRAVENOUS

## 2015-07-16 MED ORDER — ACETAMINOPHEN 650 MG RE SUPP: 650.0000 mg | Freq: Four times a day (QID) | RECTAL | Status: DC | PRN

## 2015-07-16 MED ORDER — ONDANSETRON HCL 4 MG PO TABS: 4.0000 mg | ORAL_TABLET | Freq: Four times a day (QID) | ORAL | Status: DC | PRN

## 2015-07-16 MED ORDER — ACETAMINOPHEN 325 MG PO TABS: 650.0000 mg | ORAL_TABLET | Freq: Four times a day (QID) | ORAL | Status: DC | PRN

## 2015-07-16 MED ORDER — MORPHINE SULFATE (PF) 2 MG/ML IV SOLN: 2.0000 mg | INTRAVENOUS | Status: DC | PRN

## 2015-07-16 MED ORDER — ENOXAPARIN SODIUM 40 MG/0.4ML ~~LOC~~ SOLN
40.0000 mg | SUBCUTANEOUS | Status: DC
  Administered 2015-07-16: 40 mg via SUBCUTANEOUS
  Filled 2015-07-16: qty 0.4

## 2015-07-16 MED ORDER — HYDROCODONE-ACETAMINOPHEN 5-325 MG PO TABS: 1.0000 | ORAL_TABLET | ORAL | Status: DC | PRN

## 2015-07-16 MED ORDER — ONDANSETRON HCL 4 MG/2ML IJ SOLN: 4.0000 mg | Freq: Four times a day (QID) | INTRAMUSCULAR | Status: DC | PRN

## 2015-07-16 NOTE — Progress Notes (Signed)
Pt had 14 beat v-tach. MD Loralie Champagne notified. New orders for labs placed. VSS. Pt is resting. No new complaints. Will continue to monitor.   Enez Monahan, RN

## 2015-07-16 NOTE — Progress Notes (Signed)
Skin intact, dry and warm. Skin assessment completed by this RN and Rufina Falco, RN. Oren Beckmann, RN

## 2015-07-16 NOTE — ED Notes (Signed)
Gave pt turkey sandwich and water 

## 2015-07-16 NOTE — ED Notes (Addendum)
Pt reports cp and sob for several days, had syncopal episode yesterday. Had open heart surgery in 2000 for hole in his heart and valve reconstruction. Reports the way he is feeling is how he felt before he needed open heart surgery.

## 2015-07-16 NOTE — Progress Notes (Signed)
Pt admitted to room 2w37 on tele box 2w37. Pt oriented to room. Ordering food. Call bell and phone within reach. Will continue to monitor.

## 2015-07-16 NOTE — ED Provider Notes (Signed)
CSN: [id number]     Arrival date & time 07/16/15  1026 History   First MD Initiated Contact with Patient 07/16/15 1100     Chief Complaint  Patient presents with  . Chest Pain     (Consider location/radiation/quality/duration/timing/severity/associated sxs/prior Treatment) HPI  Terry Noel. is a 49 y.o. male with PMH significant for heart murmur, heart disease, ASD and VSD s/p repair who presents with central, substernal, intermittent, moderate chest pain for the past couple of days.  Associated symptoms include SOB, dizziness, palpitations, syncope.  No LOC, fall, head injury, N/V, abdominal pain.  No modifying or aggravating factors.  He currently smokes about 0.5 ppd.  He denies PCP or cardiologist.  He is unsure of last cardiac workup.  Past Medical History  Diagnosis Date  . Heart murmur   . Heart disease   . Rheumatic fever   . ASD (atrial septal defect)     large   . VSD (ventricular septal defect)     small  . Mitral valve disease   . Syncope     1999  . Congenital heart disease   . 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    right   Past Surgical History  Procedure Laterality Date  . Cardiac surgery  2000    primum ASD repair, repair of cleft mitral valve   Family History  Problem Relation Age of Onset  . Diabetes Sister   . Mental illness Sister   . Heart disease Sister   . Arthritis Other   . Heart disease Other   . Hypertension Other   . Alcohol abuse Other   . Arthritis Other   . Heart disease Other   . Stroke Other   . Mental illness Other   . Kidney disease Other    Social History  Substance Use Topics  . Smoking status: Current Every Day Smoker  . Smokeless tobacco: Never Used  . Alcohol Use: Yes     Comment: occasional social    Review of Systems All other systems negative unless otherwise stated in HPI    Allergies  Tensilon  Home Medications   Prior to Admission medications   Medication Sig Start Date End Date Taking? Authorizing  Provider  aspirin 325 MG tablet Take 325 mg by mouth every 4 (four) hours as needed for pain (headache).   Yes Historical Provider, MD   BP 121/62 mmHg  Pulse 59  Temp(Src) 97.6 F (36.4 C) (Oral)  Resp 22  SpO2 100% Physical Exam  Constitutional: He is oriented to person, place, and time. He appears well-developed and well-nourished.  HENT:  Head: Normocephalic and atraumatic.  Mouth/Throat: Oropharynx is clear and moist.  Eyes: Conjunctivae are normal. Pupils are equal, round, and reactive to light.  Neck: Normal range of motion. Neck supple.  Cardiovascular: Normal rate, regular rhythm and normal heart sounds.   No murmur heard. Pulmonary/Chest: Effort normal and breath sounds normal. No accessory muscle usage or stridor. No respiratory distress. He has no wheezes. He has no rhonchi. He has no rales.  Abdominal: Soft. Bowel sounds are normal. He exhibits no distension. There is no tenderness.  Musculoskeletal: Normal range of motion.  Lymphadenopathy:    He has no cervical adenopathy.  Neurological: He is alert and oriented to person, place, and time.  Speech clear without dysarthria.  Skin: Skin is warm and dry.  Psychiatric: He has a normal mood and affect. His behavior is normal.    ED Course  Procedures (including  critical care time) Labs Review Labs Reviewed  BASIC METABOLIC PANEL - Abnormal; Notable for the following:    Glucose, Bld 101 (*)    All other components within normal limits  CBC  I-STAT TROPOININ, ED    Imaging Review Dg Chest 2 View  07/16/2015  CLINICAL DATA:  Chest pain for 1 day.  Syncopal episode yesterday. EXAM: CHEST  2 VIEW COMPARISON:  05/24/2010. FINDINGS: The heart is borderline enlarged but stable. Stable surgical changes from bypass surgery. The mediastinal and hilar contours are within normal limits and unchanged. The lungs are clear of acute process. No pleural effusion. The bony thorax is intact. IMPRESSION: No acute cardiopulmonary  findings. Electronically Signed   By: Marijo Sanes M.D.   On: 07/16/2015 11:22   I have personally reviewed and evaluated these images and lab results as part of my medical decision-making.   EKG Interpretation   Date/Time:  Tuesday July 16 2015 10:34:41 EST Ventricular Rate:  76 PR Interval:  176 QRS Duration: 116 QT Interval:  388 QTC Calculation: 436 R Axis:   -71 Text Interpretation:  Sinus rhythm with marked sinus arrhythmia Right  atrial enlargement Pulmonary disease pattern Left anterior fascicular  block Left ventricular hypertrophy with QRS widening Abnormal ECG  Confirmed by Hazle Coca [phone]) on 07/16/2015 10:38:24 AM      MDM   Final diagnoses:  Shortness of breath  Chest pain, unspecified chest pain type    Patient s/p ASD/VSD repair in 2000 presents with CP and SOB for the past couple of days.  Assoc sxs include syncope and dizziness.  VSS, NAD.  On exam, heart RRR, lungs CTAB, abdomen soft and benign.  Will obtain labs and CXR.  HEART score 5.  Will likely admit.  Troponin 0.00. CXR negative.  EKG shows SR with sinus arrythymia, RAL, LVH, QRS widening BMP, CBC unremarkable.  Patient admitted to hospitalist.  Case has been discussed with Dr. Regenia Skeeter who agrees with the above plan for admission.      [name], PA-C 07/16/15 [place], MD 07/18/15 [phone]

## 2015-07-16 NOTE — ED Notes (Signed)
MD at bedside. 

## 2015-07-16 NOTE — Consult Note (Signed)
CARDIOLOGY CONSULT NOTE   Patient ID: Terry Golden. MRN: [id number], DOB/AGE: [birth year]   Admit date: 07/16/2015 Date of Consult: 07/16/2015 Reason for Consult: Shortness of Breath   Primary Physician: Cathlean Cower, MD Primary Cardiologist: New  HPI: Terry Golden. is a 49 y.o. 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  The patient reports he has been experiencing lightheadedness and pre-syncope over the past three days. He reports feeling like he is going to pass out, but denies any actual syncope or falls. This occurred prior to when he had cardiac surgery in 2000 and that is what is most concerning to him today.  He has also experienced episodes of chest tightness and dyspnea which he says can occur at rest or with exertion. They do not necessarily occur at the same time. The chest tightness is worse when taking a deep breath and when lying flat. He reports having a recent cold over the past week with a sinus headache, runny nose, and cough. He denies any chest tightness of dyspnea prior to these recent events.   Reports having not seen a cardiologist since 2004. Use to follow with Dr. Lovena Le prior to surgery at [facility] in 2001 but never resumed care.  In reviewing records from [place], he had symptoms of fatigue, dyspnea on exertion and syncope when he was diagnosed with his ASVD and cleft mitral valve in 2000. Those symptoms had been present for over a year at that time. He was initially diagnosed with his ASVD at the age of 4. He underwent surgery on 03/25/1999 for partial AVSD repair and mitral valve repair with commissuroplasty by Dr. Clayborne Artist.  His last echocardiogram available for review was in 2004 and showed an EF of 60-65%, moderately thickened redundant  mitral valve with prolapse of mild degree; moderate mitral regurgitation by color flow and continuous wave, upper normal/mildly dilated left atrium;normal trileaflet aortic valve; normal aortic root size; equivocal evidence of trace aortic regurgitation by color flow and continuous wave Doppler examination; upper normal/mildly dilated right ventricle with normal contraction; mild tricuspid regurgitation; peak tricuspid regurgitant jet velocity of approximately 2 m/s, by continuous wave Doppler examination, consistent with normal right ventricular/pulmonary arterial systolic pressure; mild pulmonic regurgitation; upper normal/mildly dilated right atrium; no pericardial effusion.  Measurements 2D Equivalents M-Mode LVIDd: 48 mm mm LVIDs: 30 mm mm Fractional Shortening: 38 % % IVSd: 11 mm mm LVPWd: 11 mm mm LAIDs: 39 mm mm AoRIDd: 32 mm mm  He currently holds a variety of jobs, working as a Animator, Theatre stage manager, and helping with Architect projects. Reports a strong family history of CAD in his father's side (Dad had CABG, passed away due to cardiac arrest). Smokes 0.5ppd and consumes alcohol socially. Denies any recreational drug use.  Problem List  Past Medical History  Diagnosis Date  . Heart murmur   . Heart disease   . Rheumatic fever   . ASD (atrial septal defect)     large   . VSD (ventricular septal defect)     small  . Mitral valve disease   . Syncope     1999  . Congenital heart disease   . 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 Cardiac  surgery  2000    primum ASD repair, repair of cleft mitral valve     Allergies  Allergies  Allergen Reactions  . Tensilon [Edrophonium] Other (See Comments)    Blacked out       Inpatient Medications  . enoxaparin (LOVENOX) injection  40 mg Subcutaneous Q24H  . sodium chloride  3 mL Intravenous Q12H    Family History Family History  Problem Relation Age of  Onset  . Diabetes Sister   . Mental illness Sister   . Heart disease Sister   . Arthritis Other   . Heart disease Other   . Hypertension Other   . Alcohol abuse Other   . Arthritis Other   . Heart disease Other   . Stroke Other   . Mental illness Other   . Kidney disease Other      Social History Social History   Social History  . Marital Status: Divorced    Spouse Name: N/A  . Number of Children: N/A  . Years of Education: 12   Occupational History  . Not on file.   Social History Main Topics  . Smoking status: Current Every Day Smoker  . Smokeless tobacco: Never Used  . Alcohol Use: Yes     Comment: occasional social  . Drug Use: No  . Sexual Activity: Not on file   Other Topics Concern  . Not on file   Social History Narrative     Review of Systems General:  No chills, fever, night sweats or weight changes.  Cardiovascular:  No edema, orthopnea, palpitations, paroxysmal nocturnal dyspnea. Positive for chest tightness and dyspnea at rest and with exertion. Dermatological: No rash, lesions/masses Respiratory: Positive for cough, dyspnea Urologic: No hematuria, dysuria Abdominal:   No nausea, vomiting, diarrhea, bright red blood per rectum, melena, or hematemesis Neurologic:  No visual changes, wkns, changes in mental status. Positive for lightheadedness. All other systems reviewed and are otherwise negative except as noted above.  Physical Exam Blood pressure 146/78, pulse 107, temperature 98.1 F (36.7 C), temperature source Oral, resp. rate 13, SpO2 98 %.  General: Pleasant, thin African American male in NAD Psych: Normal affect. Neuro: Alert and oriented X 3. Moves all extremities spontaneously. HEENT: Normal  Neck: Supple without bruits or JVD. Lungs:  Resp regular and unlabored, CTA without wheezing or rales. Heart: RRR no s3, s4, or murmurs. Abdomen: Soft, non-tender, non-distended, BS + x 4.  Extremities: No clubbing, cyanosis or edema.  DP/PT/Radials 2+ and equal bilaterally.  Labs  No results for input(s): CKTOTAL, CKMB, TROPONINI in the last 72 hours. Lab Results  Component Value Date   WBC 5.3 07/16/2015   HGB 14.3 07/16/2015   HCT 43.0 07/16/2015   MCV 97.9 07/16/2015   PLT 243 07/16/2015    Recent Labs Lab 07/16/15 1041  NA 139  K 4.0  CL 105  CO2 26  BUN 9  CREATININE 1.11  CALCIUM 9.4  GLUCOSE 101*   Lab Results  Component Value Date   CHOL 145 05/16/2012   HDL 44.70 05/16/2012   LDLCALC 93 05/16/2012   TRIG 39.0 05/16/2012   No results found for: DDIMER  Radiology/Studies  Dg Chest 2 View: 07/16/2015  CLINICAL DATA:  Chest pain for 1 day.  Syncopal episode yesterday. EXAM: CHEST  2 VIEW COMPARISON:  05/24/2010. FINDINGS: The heart is borderline enlarged but stable. Stable surgical changes from bypass surgery. The mediastinal and hilar contours are within normal limits and unchanged. The lungs are  clear of acute process. No pleural effusion. The bony thorax is intact. IMPRESSION: No acute cardiopulmonary findings. Electronically Signed   By: Marijo Sanes M.D.   On: 07/16/2015 11:22    ECG: NSR with rate in 70's. Known LAFB.  ECHOCARDIOGRAM: Pending   ASSESSMENT AND PLAN:  1. New onset dyspnea, chest tightness, and lightheadedness - all has occurred in the past 3 days as he has been recovering from a cold. Denies any symptoms prior to this episode. Has not noticed any particular pattern to his symptoms. Chest tightness is worse with deep breathing and lying flat.   - likely unrelated to his cardiac history. - continue to cycle cardiac enzymes. - agree with obtaining repeat echocardiogram  2. 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  Agree with above as stated.  No obvious sign of heart failure on exam.  Await echo to see LV and valvular function.  Plans based on imaging study.  Chest tightness is very atypical.  VARANASI,JAYADEEP S.

## 2015-07-16 NOTE — H&P (Signed)
[facility] History and Physical  Terry Golden. [id number] DOB: 16-Jun-1966 DOA: 07/16/2015  Referring physician: Emergency Department PCP: Cathlean Cower, MD   CHIEF COMPLAINT:  Shortness of breath  HPI: Terry Golden. is a 49 y.o. male who was found at age 69 to have a partial atrial septal defect and cleft mitral valve.  In year 2000 patient developed progressive dyspnea and syncope. He underwent repair of the septal defect in 2000 at [facility], he hasn't seen Cardiology since.   Two days ago patient developed shortness of breath and chest tightness with minimal exertion. When sitting up or standing he is lightheaded, feels like passing out. He reports some unexplained weight loss. All of these symptoms were present prior to cardiac surgery 16 years ago. Patient recalls being told that there was still a pinpoint hole in his heart following surgery . Patient just getting over a cold, reports residual cough. No other complaints. He gives a history of lactose intolerance  ED COURSE:     SL NTG  Labs:   CBC, bmet unremarkable.  trop 0.0  CXR:    No acute abnormalities  EKG:    Sinus rhythm with marked sinus arrhythmia Right atrial enlargement Pulmonary disease pattern Left anterior fascicular block Left ventricular hypertrophy with QRS widening Abnormal ECG Confirmed by Hazle Coca [phone]) on 07/16/2015 10:38:24 AM  Medications  nitroGLYCERIN (NITROSTAT) SL tablet 0.4 mg (not administered)  aspirin EC tablet 325 mg (325 mg Oral Given 07/16/15 1155)    Review of Systems  Constitutional: Negative.   HENT: Negative.   Eyes: Negative.   Respiratory: Positive for cough and shortness of breath.   Cardiovascular: Negative.   Gastrointestinal: Negative.   Genitourinary: Negative.   Musculoskeletal: Negative.   Skin: Negative.   Neurological: Negative.   Endo/Heme/Allergies: Negative.   Psychiatric/Behavioral: Negative.    Past Medical History  Diagnosis  Date  . Heart murmur   . Heart disease   . Rheumatic fever   . ASD (atrial septal defect)     large   . VSD (ventricular septal defect)     small  . Mitral valve disease   . Syncope     1999  . Congenital heart disease   . 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    right   Past Surgical History  Procedure Laterality Date  . Cardiac surgery  2000    primum ASD repair, repair of cleft mitral valve    SOCIAL HISTORY:  reports that he has been smoking.  He has never used smokeless tobacco. He reports that he drinks alcohol. He reports that he does not use illicit drugs. Lives: At home with family    Assistive devices:   None needed for ambulation.   Allergies  Allergen Reactions  . Tensilon [Edrophonium] Other (See Comments)    Blacked out     Family History  Problem Relation Age of Onset  . Diabetes Sister   . Mental illness Sister   . Heart disease Sister   . Arthritis Other   . Heart disease Other   . Hypertension Other   . Alcohol abuse Other   . Arthritis Other   . Heart disease Other   . Stroke Other   . Mental illness Other   . Kidney disease Other     Prior to Admission medications   Medication Sig Start Date End Date Taking? Authorizing Provider  aspirin 325 MG tablet Take 325 mg by mouth every 4 (four) hours  as needed for pain (headache).   Yes Historical Provider, MD   PHYSICAL EXAM: Filed Vitals:   07/16/15 1035 07/16/15 1116 07/16/15 1118 07/16/15 1200  BP: 142/96 130/76  121/62  Pulse: 86  57 59  Temp: 97.6 F (36.4 C)     TempSrc: Oral     Resp: 16  24 22   SpO2: 98%  100% 100%    Wt Readings from Last 3 Encounters:  03/17/12 64.524 kg (142 lb 4 oz)    General:  Pleasant thin, black male. Appears calm and comfortable Eyes: PER, normal lids, irises & conjunctiva ENT: grossly normal hearing, lips & tongue Neck: no LAD, no masses Cardiovascular: Regular rate , occasional irregular beat . Cardiac monitor reading atrial fibrillation.  No LE edema.    Respiratory: Respirations even and unlabored. Normal respiratory effort. Lungs CTA bilaterally, no wheezes / rales .   Abdomen: soft, non-distended, non-tender, active bowel sounds. No obvious masses.  Skin: no rash seen on limited exam Musculoskeletal: grossly normal tone BUE/BLE Psychiatric: grossly normal mood and affect, speech fluent and appropriate Neurologic: grossly non-focal.         LABS ON ADMISSION:    Basic Metabolic Panel:  Recent Labs Lab 07/16/15 1041  NA 139  K 4.0  CL 105  CO2 26  GLUCOSE 101*  BUN 9  CREATININE 1.11  CALCIUM 9.4   CBC:  Recent Labs Lab 07/16/15 1041  WBC 5.3  HGB 14.3  HCT 43.0  MCV 97.9  PLT 243    Creatinine clearance cannot be calculated (Unknown ideal weight.)  Radiological Exams on Admission: Dg Chest 2 View  07/16/2015  CLINICAL DATA:  Chest pain for 1 day.  Syncopal episode yesterday. EXAM: CHEST  2 VIEW COMPARISON:  05/24/2010. FINDINGS: The heart is borderline enlarged but stable. Stable surgical changes from bypass surgery. The mediastinal and hilar contours are within normal limits and unchanged. The lungs are clear of acute process. No pleural effusion. The bony thorax is intact. IMPRESSION: No acute cardiopulmonary findings. Electronically Signed   By: Marijo Sanes M.D.   On: 07/16/2015 11:22    ASSESSMENT / PLAN   Dyspnea / chest tightness / positional lightheadedness. Patient had same exact symptoms just prior to repair of an atrial septal defect in year 2000 at [facility]. No cardiology follow up since surgery. CXR, initial  troponin normal. Afib with HR in 60's on tele monitor but not present on EKG this am -admit for Observation - telemetry bed -BNP, cycle troponins -obtain echocardiogram -prn 02 -Cardiology consult    Congenital heart disease /partial atrial septal defect repaired in year 2000 when patient developed progressive dyspnea.   CONSULTANTS:     Code Status: full code DVT Prophylaxis: Lovenox   Family Communication:  Patient alert, oriented and understands plan of care.    Disposition Plan: Discharge to home in  24-48 hours   Time spent: 60 minutes Tye Savoy  NP [facility] Pager [phone]

## 2015-07-16 NOTE — ED Notes (Signed)
ATTEMPTED REPORT  

## 2015-07-17 ENCOUNTER — Observation Stay (HOSPITAL_BASED_OUTPATIENT_CLINIC_OR_DEPARTMENT_OTHER): Payer: Self-pay

## 2015-07-17 DIAGNOSIS — R0600 Dyspnea, unspecified: Secondary | ICD-10-CM

## 2015-07-17 DIAGNOSIS — I059 Rheumatic mitral valve disease, unspecified: Secondary | ICD-10-CM

## 2015-07-17 LAB — BASIC METABOLIC PANEL
ANION GAP: 6 (ref 5–15)
BUN: 11 mg/dL (ref 6–20)
CO2: 25 mmol/L (ref 22–32)
Calcium: 8.7 mg/dL — ABNORMAL LOW (ref 8.9–10.3)
Chloride: 106 mmol/L (ref 101–111)
Creatinine, Ser: 1.08 mg/dL (ref 0.61–1.24)
GFR calc Af Amer: >60 mL/min (ref >60)
GFR calc non Af Amer: >60 mL/min (ref >60)
GLUCOSE: 88 mg/dL (ref 65–99)
POTASSIUM: 3.8 mmol/L (ref 3.5–5.1)
Sodium: 137 mmol/L (ref 135–145)

## 2015-07-17 LAB — HEPATIC FUNCTION PANEL
ALBUMIN: 3.4 g/dL — AB (ref 3.5–5.0)
ALK PHOS: 53 U/L (ref 38–126)
ALT: 11 U/L — AB (ref 17–63)
AST: 17 U/L (ref 15–41)
BILIRUBIN TOTAL: 0.5 mg/dL (ref 0.3–1.2)
Bilirubin, Direct: 0.1 mg/dL (ref 0.1–0.5)
Indirect Bilirubin: 0.4 mg/dL (ref 0.3–0.9)
Total Protein: 6.2 g/dL — ABNORMAL LOW (ref 6.5–8.1)

## 2015-07-17 LAB — MAGNESIUM: Magnesium: 2 mg/dL (ref 1.7–2.4)

## 2015-07-17 LAB — TSH: TSH: 0.724 u[IU]/mL (ref 0.350–4.500)

## 2015-07-17 NOTE — Progress Notes (Signed)
Discharge instructions provided by day shift RN, IV was removed, CCMD notified, patient verbalized understanding with no questions at this time. Patient was accompanied home by mother. Educated patient on when to call for help and provided unit phone number with any questions when patient got home.   Candise Che, RN

## 2015-07-17 NOTE — Progress Notes (Signed)
  Echocardiogram 2D Echocardiogram has been performed.  [name] 07/17/2015, 10:00 AM

## 2015-07-17 NOTE — Progress Notes (Signed)
[facility] PROGRESS NOTE  Terry Golden. [id number] DOB: 1965-11-12 DOA: 07/16/2015 PCP: Cathlean Cower, MD  Length of stay: 1   Assessment/Plan: Principal Problem:   Shortness of breath Active Problems:   Congenital heart disease   Dyspnea   Positional lightheadedness   Pain in the chest   49 y.o. male with a Past Medical History of heart disease, rheumatic fever, AST, VSD, status post ASD repair in 2000 who presents with dyspnea and chest pain.    ASSESSMENT / PLAN   Dyspnea / chest tightness / positional lightheadedness. Patient had same exact symptoms just prior to repair of an atrial septal defect in year 2000 at [facility]. No cardiology follow up since surgery. CXR, initial troponin normal. Afib with HR in 60's on tele monitor but not present on EKG this am cont telemetry bed  troponins negative -obtain echocardiogram -prn 02 -Cardiology recs pending    Congenital heart disease /partial atrial septal defect repaired in year 2000 when patient developed progressive dyspnea.        DVT prophylaxsis lovenox  Code Status:      Code Status Orders        Start     Ordered   07/16/15 1512  Full code   Continuous     07/16/15 1511      Family Communication: Discussed in detail with the patient, all imaging results, lab results explained to the patient   Disposition Plan:  As above     Consultants: Jettie Booze, MD  Procedures:  none  Antibiotics: Anti-infectives    None         HPI/Subjective: *dyspnea which he says can occur at rest or with exertion,chest tightness is worse when taking a deep breath and when lying flat  Objective: Filed Vitals:   07/16/15 1430 07/16/15 1509 07/16/15 2025 07/16/15 2153  BP: 122/77 146/78 127/79 117/66  Pulse: 59 107 57 66  Temp:  98.1 F (36.7 C) 97.5 F (36.4 C) 98 F (36.7 C)  TempSrc:   Oral Oral  Resp: 13  15 18   SpO2: 99% 98% 98% 97%    Intake/Output Summary  (Last 24 hours) at 07/17/15 1057 Last data filed at 07/17/15 [id number]  Gross per 24 hour  Intake    240 ml  Output      0 ml  Net    240 ml    Exam:  General: No acute respiratory distress Lungs: Clear to auscultation bilaterally without wheezes or crackles Cardiovascular: Regular rate and rhythm without murmur gallop or rub normal S1 and S2 Abdomen: Nontender, nondistended, soft, bowel sounds positive, no rebound, no ascites, no appreciable mass Extremities: No significant cyanosis, clubbing, or edema bilateral lower extremities     Data Review   Micro Results No results found for this or any previous visit (from the past 240 hour(s)).  Radiology Reports Dg Chest 2 View  07/16/2015  CLINICAL DATA:  Chest pain for 1 day.  Syncopal episode yesterday. EXAM: CHEST  2 VIEW COMPARISON:  05/24/2010. FINDINGS: The heart is borderline enlarged but stable. Stable surgical changes from bypass surgery. The mediastinal and hilar contours are within normal limits and unchanged. The lungs are clear of acute process. No pleural effusion. The bony thorax is intact. IMPRESSION: No acute cardiopulmonary findings. Electronically Signed   By: Marijo Sanes M.D.   On: 07/16/2015 11:22     CBC  Recent Labs Lab 07/16/15 1041  WBC 5.3  HGB 14.3  HCT 43.0  PLT 243  MCV 97.9  MCH 32.6  MCHC 33.3  RDW 12.8    Chemistries   Recent Labs Lab 07/16/15 1041 07/17/15 0403  NA 139 137  K 4.0 3.8  CL 105 106  CO2 26 25  GLUCOSE 101* 88  BUN 9 11  CREATININE 1.11 1.08  CALCIUM 9.4 8.7*  MG  --  2.0  AST  --  17  ALT  --  11*  ALKPHOS  --  53  BILITOT  --  0.5   ------------------------------------------------------------------------------------------------------------------ CrCl cannot be calculated (Unknown ideal weight.). ------------------------------------------------------------------------------------------------------------------ No results for input(s): HGBA1C in the last 72  hours. ------------------------------------------------------------------------------------------------------------------ No results for input(s): CHOL, HDL, LDLCALC, TRIG, CHOLHDL, LDLDIRECT in the last 72 hours. ------------------------------------------------------------------------------------------------------------------  Recent Labs  07/17/15 0403  TSH 0.724   ------------------------------------------------------------------------------------------------------------------ No results for input(s): VITAMINB12, FOLATE, FERRITIN, TIBC, IRON, RETICCTPCT in the last 72 hours.  Coagulation profile No results for input(s): INR, PROTIME in the last 168 hours.  No results for input(s): DDIMER in the last 72 hours.  Cardiac Enzymes  Recent Labs Lab 07/16/15 1603 07/16/15 2150  TROPONINI <0.03 <0.03   ------------------------------------------------------------------------------------------------------------------ Invalid input(s): POCBNP   CBG: No results for input(s): GLUCAP in the last 168 hours.     Studies: Dg Chest 2 View  07/16/2015  CLINICAL DATA:  Chest pain for 1 day.  Syncopal episode yesterday. EXAM: CHEST  2 VIEW COMPARISON:  05/24/2010. FINDINGS: The heart is borderline enlarged but stable. Stable surgical changes from bypass surgery. The mediastinal and hilar contours are within normal limits and unchanged. The lungs are clear of acute process. No pleural effusion. The bony thorax is intact. IMPRESSION: No acute cardiopulmonary findings. Electronically Signed   By: Marijo Sanes M.D.   On: 07/16/2015 11:22      No results found for: HGBA1C Lab Results  Component Value Date   LDLCALC 93 05/16/2012   CREATININE 1.08 07/17/2015       Scheduled Meds: . enoxaparin (LOVENOX) injection  40 mg Subcutaneous Q24H  . sodium chloride  3 mL Intravenous Q12H   Continuous Infusions:   Principal Problem:   Shortness of breath Active Problems:   Congenital  heart disease   Dyspnea   Positional lightheadedness   Pain in the chest    Time spent: 45 minutes   [facility] Pager [phone]. If 7PM-7AM, please contact night-coverage at [email or web address], password [facility] 07/17/2015, 10:57 AM  LOS: 1 day

## 2015-07-17 NOTE — Discharge Summary (Signed)
Physician Discharge Summary  Terry Golden. MRN: [id number] DOB/AGE: 1965/10/06 49 y.o.  PCP: Cathlean Cower, MD   Admit date: 07/16/2015 Discharge date: 07/17/2015  Discharge Diagnoses:     Principal Problem:   Shortness of breath Active Problems:   Congenital heart disease   Dyspnea   Positional lightheadedness   Pain in the chest   Mitral valve disorder    Follow-up recommendations Follow-up with PCP in 3-5 days , including all  additional recommended appointments as below Follow-up CBC, CMP in 3-5 days He needs SBE prophylaxis with dental work.   follow up with Dr. Crissie Sickles       Medication List    TAKE these medications        aspirin 325 MG tablet  Take 325 mg by mouth every 4 (four) hours as needed for pain (headache).         Discharge Condition: stable   Discharge Instructions       Discharge Instructions    Diet - low sodium heart healthy    Complete by:  As directed      Increase activity slowly    Complete by:  As directed           Current Discharge Medication List    CONTINUE these medications which have NOT CHANGED   Details  aspirin 325 MG tablet Take 325 mg by mouth every 4 (four) hours as needed for pain (headache).       Allergies  Allergen Reactions  . Tensilon [Edrophonium] Other (See Comments)    Blacked out       Disposition: 01-Home or Self Care   Consults:  Cardiology   Significant Diagnostic Studies:  Dg Chest 2 View  07/16/2015  CLINICAL DATA:  Chest pain for 1 day.  Syncopal episode yesterday. EXAM: CHEST  2 VIEW COMPARISON:  05/24/2010. FINDINGS: The heart is borderline enlarged but stable. Stable surgical changes from bypass surgery. The mediastinal and hilar contours are within normal limits and unchanged. The lungs are clear of acute process. No pleural effusion. The bony thorax is intact. IMPRESSION: No acute cardiopulmonary findings. Electronically Signed   By: Marijo Sanes M.D.   On:  07/16/2015 11:22    Echo LV EF: 55% -  60%  ------------------------------------------------------------------- Indications:   Dyspnea 786.09.  ------------------------------------------------------------------- History:  PMH: Atrial septal defect. Ventricular septal Defect. Cleft Mitral valve. Rheumatic fever. Congenital heart disese.  ------------------------------------------------------------------- Study Conclusions  - Left ventricle: The cavity size was normal. Wall thickness was increased in a pattern of mild LVH. Systolic function was normal. The estimated ejection fraction was in the range of 55% to 60%. Wall motion was normal; there were no regional wall motion abnormalities. - Mitral valve: There was mild to moderate regurgitation. - Atrial septum: No defect or patent foramen ovale    There were no vitals filed for this visit.   Microbiology: No results found for this or any previous visit (from the past 240 hour(s)).     Blood Culture No results found for: SDES, [place], [place], REPTSTATUS    Labs: Results for orders placed or performed during the hospital encounter of 07/16/15 (from the past 48 hour(s))  Basic metabolic panel     Status: Abnormal   Collection Time: 07/16/15 10:41 AM  Result Value Ref Range   Sodium 139 135 - 145 mmol/L   Potassium 4.0 3.5 - 5.1 mmol/L   Chloride 105 101 - 111 mmol/L   CO2 26 22 - 32  mmol/L   Glucose, Bld 101 (H) 65 - 99 mg/dL   BUN 9 6 - 20 mg/dL   Creatinine, Ser 1.11 0.61 - 1.24 mg/dL   Calcium 9.4 8.9 - 10.3 mg/dL   GFR calc non Af Amer >60 >60 mL/min   GFR calc Af Amer >60 >60 mL/min    Comment: (NOTE) The eGFR has been calculated using the CKD EPI equation. This calculation has not been validated in all clinical situations. eGFR's persistently <60 mL/min signify possible Chronic Kidney Disease.    Anion gap 8 5 - 15  CBC     Status: None   Collection Time: 07/16/15 10:41 AM  Result Value  Ref Range   WBC 5.3 4.0 - 10.5 K/uL   RBC 4.39 4.22 - 5.81 MIL/uL   Hemoglobin 14.3 13.0 - 17.0 g/dL   HCT 43.0 39.0 - 52.0 %   MCV 97.9 78.0 - 100.0 fL   MCH 32.6 26.0 - 34.0 pg   MCHC 33.3 30.0 - 36.0 g/dL   RDW 12.8 11.5 - 15.5 %   Platelets 243 150 - 400 K/uL  I-stat troponin, ED (not at [facility], [facility])     Status: None   Collection Time: 07/16/15 10:51 AM  Result Value Ref Range   Troponin i, poc 0.00 0.00 - 0.08 ng/mL   Comment 3            Comment: Due to the release kinetics of cTnI, a negative result within the first hours of the onset of symptoms does not rule out myocardial infarction with certainty. If myocardial infarction is still suspected, repeat the test at appropriate intervals.   Brain natriuretic peptide     Status: None   Collection Time: 07/16/15  4:03 PM  Result Value Ref Range   B Natriuretic Peptide 8.3 0.0 - 100.0 pg/mL  Troponin I (q 6hr x 3)     Status: None   Collection Time: 07/16/15  4:03 PM  Result Value Ref Range   Troponin I <0.03 <0.031 ng/mL    Comment:        NO INDICATION OF MYOCARDIAL INJURY.   Troponin I (q 6hr x 3)     Status: None   Collection Time: 07/16/15  9:50 PM  Result Value Ref Range   Troponin I <0.03 <0.031 ng/mL    Comment:        NO INDICATION OF MYOCARDIAL INJURY.   TSH     Status: None   Collection Time: 07/17/15  4:03 AM  Result Value Ref Range   TSH 0.724 0.350 - 4.500 uIU/mL  Basic metabolic panel     Status: Abnormal   Collection Time: 07/17/15  4:03 AM  Result Value Ref Range   Sodium 137 135 - 145 mmol/L   Potassium 3.8 3.5 - 5.1 mmol/L   Chloride 106 101 - 111 mmol/L   CO2 25 22 - 32 mmol/L   Glucose, Bld 88 65 - 99 mg/dL   BUN 11 6 - 20 mg/dL   Creatinine, Ser 1.08 0.61 - 1.24 mg/dL   Calcium 8.7 (L) 8.9 - 10.3 mg/dL   GFR calc non Af Amer >60 >60 mL/min   GFR calc Af Amer >60 >60 mL/min    Comment: (NOTE) The eGFR has been calculated using the CKD EPI equation. This calculation has not been  validated in all clinical situations. eGFR's persistently <60 mL/min signify possible Chronic Kidney Disease.    Anion gap 6 5 - 15  Magnesium  Status: None   Collection Time: 07/17/15  4:03 AM  Result Value Ref Range   Magnesium 2.0 1.7 - 2.4 mg/dL  Hepatic function panel     Status: Abnormal   Collection Time: 07/17/15  4:03 AM  Result Value Ref Range   Total Protein 6.2 (L) 6.5 - 8.1 g/dL   Albumin 3.4 (L) 3.5 - 5.0 g/dL   AST 17 15 - 41 U/L   ALT 11 (L) 17 - 63 U/L   Alkaline Phosphatase 53 38 - 126 U/L   Total Bilirubin 0.5 0.3 - 1.2 mg/dL   Bilirubin, Direct 0.1 0.1 - 0.5 mg/dL   Indirect Bilirubin 0.4 0.3 - 0.9 mg/dL     Lipid Panel     Component Value Date/Time   CHOL 145 05/16/2012 0908   TRIG 39.0 05/16/2012 0908   HDL 44.70 05/16/2012 0908   CHOLHDL 3 05/16/2012 0908   VLDL 7.8 05/16/2012 0908   LDLCALC 93 05/16/2012 0908     No results found for: HGBA1C   Lab Results  Component Value Date   LDLCALC 93 05/16/2012   CREATININE 1.08 07/17/2015     HPI :49 y.o. 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  The patient reports he has been experiencing lightheadedness and pre-syncope over the past three days. He reports feeling like he is going to pass out, but denies any actual syncope or falls. This occurred prior to when he had cardiac surgery in 2000 and that is what is most concerning to him today.  He has also experienced episodes of chest tightness and dyspnea which he says can occur at rest or with exertion. They do not necessarily occur at the same time. The chest tightness is worse when taking a deep breath and when lying flat. He reports having a recent cold over the past week with a sinus headache, runny nose, and cough. He denies any chest  tightness of dyspnea prior to these recent events.   Reports having not seen a cardiologist since 2004. Use to follow with Dr. Lovena Le prior to surgery at [facility] in 2001 but never resumed care.  In reviewing records from [place], he had symptoms of fatigue, dyspnea on exertion and syncope when he was diagnosed with his ASVD and cleft mitral valve in 2000. Those symptoms had been present for over a year at that time. He was initially diagnosed with his ASVD at the age of 78. He underwent surgery on 03/25/1999 for partial AVSD repair and mitral valve repair with commissuroplasty by Dr. Clayborne Artist.  His last echocardiogram available for review was in 2004 and showed an EF of 60-65%, moderately thickened redundant mitral valve with prolapse of mild degree; moderate mitral regurgitation   no pericardial effusion.  HOSPITAL COURSE:  New onset dyspnea, chest tightness, and lightheadedness - cardiac enzymes remain negative. Tele with no arrhythmia but some bradycardia overnight.  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 Nothing to explain his symptoms Ok to dc per Dr [name], follow up with Dr Lovena Le and needs SBE prophylaxis  prior to procedures   Discharge Exam:    Blood pressure 120/62, pulse 61, temperature 98.7 F (37.1 C), temperature source Oral, resp. rate 17, SpO2 100 %.  General: Pleasant, NAD. Neuro: Alert and oriented X 3. Moves all extremities spontaneously. Psych: Normal affect. HEENT: Normal Neck: Supple without bruits or JVD. Lungs: Resp regular and unlabored, CTA. Heart: RRR no s3, s4, or murmurs. Abdomen: Soft, non-tender, non-distended, BS + x 4.  Extremities: No clubbing, cyanosis or edema. DP/PT/Radials 2+  and equal bilaterally   Follow-up Information    Follow up with Cathlean Cower, MD. Schedule an appointment as soon as possible for a visit in 3 days.   Specialties:  Internal Medicine, Radiology   Contact information:   [place] [id number] [phone]       Signed: Reyne Dumas 07/17/2015, 4:04 PM        Time spent >45 mins

## 2015-07-17 NOTE — Progress Notes (Signed)
Patient Name: Terry Golden. Date of Encounter: 07/17/2015     Principal Problem:   Shortness of breath Active Problems:   Congenital heart disease   Dyspnea   Positional lightheadedness   Pain in the chest    SUBJECTIVE  Feeling better still a little dyspnea but much improved. No further chest pain.   CURRENT MEDS . enoxaparin (LOVENOX) injection  40 mg Subcutaneous Q24H  . sodium chloride  3 mL Intravenous Q12H    OBJECTIVE  Filed Vitals:   07/16/15 1430 07/16/15 1509 07/16/15 2025 07/16/15 2153  BP: 122/77 146/78 127/79 117/66  Pulse: 59 107 57 66  Temp:  98.1 F (36.7 C) 97.5 F (36.4 C) 98 F (36.7 C)  TempSrc:   Oral Oral  Resp: 13  15 18   SpO2: 99% 98% 98% 97%    Intake/Output Summary (Last 24 hours) at 07/17/15 1149 Last data filed at 07/17/15 [id number]  Gross per 24 hour  Intake    240 ml  Output      0 ml  Net    240 ml   There were no vitals filed for this visit.  PHYSICAL EXAM  General: Pleasant, NAD. Neuro: Alert and oriented X 3. Moves all extremities spontaneously. Psych: Normal affect. HEENT:  Normal  Neck: Supple without bruits or JVD. Lungs:  Resp regular and unlabored, CTA. Heart: RRR no s3, s4, or murmurs. Abdomen: Soft, non-tender, non-distended, BS + x 4.  Extremities: No clubbing, cyanosis or edema. DP/PT/Radials 2+ and equal bilaterally.  Accessory Clinical Findings  CBC  Recent Labs  07/16/15 1041  WBC 5.3  HGB 14.3  HCT 43.0  MCV 97.9  PLT 0000000   Basic Metabolic Panel  Recent Labs  07/16/15 1041 07/17/15 0403  NA 139 137  K 4.0 3.8  CL 105 106  CO2 26 25  GLUCOSE 101* 88  BUN 9 11  CREATININE 1.11 1.08  CALCIUM 9.4 8.7*  MG  --  2.0   Liver Function Tests  Recent Labs  07/17/15 0403  AST 17  ALT 11*  ALKPHOS 53  BILITOT 0.5  PROT 6.2*  ALBUMIN 3.4*   No results for input(s): LIPASE, AMYLASE in the last 72 hours. Cardiac Enzymes  Recent Labs  07/16/15 1603 07/16/15 2150  TROPONINI  <0.03 <0.03     Thyroid Function Tests  Recent Labs  07/17/15 0403  TSH 0.724    TELE  NSR with some bradycardia overnight.   Radiology/Studies  Dg Chest 2 View  07/16/2015  CLINICAL DATA:  Chest pain for 1 day.  Syncopal episode yesterday. EXAM: CHEST  2 VIEW COMPARISON:  05/24/2010. FINDINGS: The heart is borderline enlarged but stable. Stable surgical changes from bypass surgery. The mediastinal and hilar contours are within normal limits and unchanged. The lungs are clear of acute process. No pleural effusion. The bony thorax is intact. IMPRESSION: No acute cardiopulmonary findings. Electronically Signed   By: Marijo Sanes M.D.   On: 07/16/2015 11:22     2D ECHO: 07/17/2015 LV EF: 55% -   60% Study Conclusions - Left ventricle: The cavity size was normal. Wall thickness was   increased in a pattern of mild LVH. Systolic function was normal.   The estimated ejection fraction was in the range of 55% to 60%.   Wall motion was normal; there were no regional wall motion   abnormalities. - Mitral valve: There was mild to moderate regurgitation. - Atrial septum: No defect or patent foramen  ovale was identified.   ASSESSMENT AND PLAN  Terry Golden. is a 49 y.o. 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  New onset dyspnea, chest tightness, and lightheadedness - cardiac enzymes remain negative. Tele with no arrhythmia but some bradycardia overnight.  - repeat 2D ECHO with normal EF 55-60%, normal WM, mild-mod MR and no ASD or PFO identified  History of congenital heart disease - history of partial ASVD and cleft mitral valve (s/p ASVD repair and mitral valve repair in 2000) - repeat echocardiogram with stable repairs and only mild-mod MR. Nothing to explain his  symptoms.      SignedEileen Stanford PA-C  Pager [phone]  I have examined the patient and reviewed assessment and plan and discussed with patient.  Agree with above as stated.  Cardiac function appears well preserved.  Patient feels better.  OK to discharge from a cardiac standpoint.  He needs SBE prophylaxis with dental work.  He would like to follow up with Dr. Crissie Sickles who he has seen in the past.   Terry Sitzmann S.

## 2015-07-23 ENCOUNTER — Other Ambulatory Visit (INDEPENDENT_AMBULATORY_CARE_PROVIDER_SITE_OTHER): Payer: Self-pay

## 2015-07-23 ENCOUNTER — Encounter: Payer: Self-pay | Admitting: Family

## 2015-07-23 ENCOUNTER — Telehealth: Payer: Self-pay | Admitting: Family

## 2015-07-23 ENCOUNTER — Ambulatory Visit (INDEPENDENT_AMBULATORY_CARE_PROVIDER_SITE_OTHER): Payer: Self-pay | Admitting: Family

## 2015-07-23 VITALS — BP 120/82 | HR 61 | Temp 97.7°F | Resp 18 | Ht 69.0 in | Wt 144.0 lb

## 2015-07-23 DIAGNOSIS — R079 Chest pain, unspecified: Secondary | ICD-10-CM

## 2015-07-23 DIAGNOSIS — R0602 Shortness of breath: Secondary | ICD-10-CM

## 2015-07-23 DIAGNOSIS — Q210 Ventricular septal defect: Secondary | ICD-10-CM

## 2015-07-23 LAB — COMPREHENSIVE METABOLIC PANEL
ALK PHOS: 55 U/L (ref 39–117)
ALT: 12 U/L (ref 0–53)
AST: 15 U/L (ref 0–37)
Albumin: 4 g/dL (ref 3.5–5.2)
BILIRUBIN TOTAL: 0.4 mg/dL (ref 0.2–1.2)
BUN: 13 mg/dL (ref 6–23)
CO2: 30 mEq/L (ref 19–32)
Calcium: 9.3 mg/dL (ref 8.4–10.5)
Chloride: 105 mEq/L (ref 96–112)
Creatinine, Ser: 0.99 mg/dL (ref 0.40–1.50)
GFR: 102.93 mL/min (ref >60.00)
GLUCOSE: 95 mg/dL (ref 70–99)
Potassium: 4.3 mEq/L (ref 3.5–5.1)
SODIUM: 139 meq/L (ref 135–145)
TOTAL PROTEIN: 7 g/dL (ref 6.0–8.3)

## 2015-07-23 LAB — CBC
HCT: 43.1 % (ref 39.0–52.0)
Hemoglobin: 14.2 g/dL (ref 13.0–17.0)
MCHC: 33 g/dL (ref 30.0–36.0)
MCV: 97.7 fl (ref 78.0–100.0)
Platelets: 241 10*3/uL (ref 150.0–400.0)
RBC: 4.41 Mil/uL (ref 4.22–5.81)
RDW: 13.8 % (ref 11.5–15.5)
WBC: 5.8 10*3/uL (ref 4.0–10.5)

## 2015-07-23 NOTE — Telephone Encounter (Signed)
Please inform patient that his kidney function, electrolytes, liver function and white/red blood cells are all within the normal limits. Please follow up with cardiology as we discussed.

## 2015-07-23 NOTE — Progress Notes (Signed)
Subjective:    Patient ID: Terry Golden., male    DOB: 21-Apr-1966, 49 y.o.   MRN: [id number]  Chief Complaint  Patient presents with  . Hospitalization Follow-up    was seen in the hospital for chest pain, still has some pain on and off, states when he walks up stairs he can still feel the irregular heartbeat    HPI:  Terry Golden. is a 49 y.o. 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 and presents today for a follow up office visit.  Recently evaluated in the emergency room and admitted to the hospital with central, substernal, intermittent, moderate chest pain for a couple days. He was experiencing shortness of breath, dizziness, palpitations, and syncope. Physical exam revealed normal cardiac, pulmonary and abdominal assessments. Troponin was negative. CXR was also unremarkable. ECG show normal sinus rhythm. Repeat echo showed normal EF of 55-60% and additional cardiac enzymes were negative. History of partial ASCD and cleft mitral valve. Determined by cardiology to be stable and discharged on aspirin. Encouraged follow up for CBC and CMET. Will follow with Dr. Lovena Le. All hospital records and tests were reviewed in detail.   Since leaving the hospital he continues to experience chest pain that waxes and wanes and described as sharp on occasion and like heartburn. Also describes a shortness of breath with activities like climbing stairs and is lightheaded on occasion. Denies chest pain or shortness of breath currently.  Allergies  Allergen Reactions  . Tensilon [Edrophonium] Other (See Comments)    Blacked out      Current Outpatient Prescriptions on File Prior to Visit  Medication Sig Dispense Refill  . aspirin 325 MG tablet Take 325 mg by mouth every 4 (four) hours as needed for pain  (headache).     No current facility-administered medications on file prior to visit.    Past Medical History  Diagnosis Date  . Rheumatic fever   . ASD (atrial septal defect)     large   . VSD (ventricular septal defect)     small  . Mitral valve disease 03/25/1999    s/p mitral valve repair with commissuroplasty   . Syncope     1999  . Congenital heart disease     ASVD, Cleft Mitral Valve  . 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 Cardiac surgery  03/25/1999    s/p partial AVSD repair and cleft mitral valve repair with commissuroplasty     Review of Systems  Constitutional: Negative for fever, chills and diaphoresis.  Respiratory: Positive for shortness of breath. Negative for chest tightness.   Cardiovascular: Positive for chest pain and palpitations. Negative for leg swelling.  Neurological: Negative for headaches.      Objective:    BP 120/82 mmHg  Pulse 61  Temp(Src) 97.7 F (36.5 C) (Oral)  Resp 18  Ht 5\' 9"  (1.753 m)  Wt 144 lb (65.318 kg)  BMI 21.26 kg/m2  SpO2 99% Nursing note and vital signs reviewed.  Physical Exam  Constitutional: He is oriented to person, place, and time. He appears well-developed and well-nourished. No distress.  HENT:  Mouth/Throat: Mucous membranes are not cyanotic.  Cardiovascular: Normal rate and intact distal pulses.  An irregular rhythm present.  Murmur heard.  Systolic murmur is  present with a grade of 2/6  Pulmonary/Chest: Effort normal and breath sounds normal. No accessory muscle usage. No respiratory distress.  Neurological: He is alert and oriented to person, place, and time.  Skin: Skin is warm and dry.  Psychiatric: He has a normal mood and affect. His behavior is normal. Judgment and thought content normal.       Assessment & Plan:   Problem List Items Addressed This Visit      Cardiovascular and Mediastinum   VSD (ventricular septal defect)      Other   Shortness of breath - Primary   Pain in the chest    Waxing and waning chest pain with no evidence of ACS given previous hospital work-up and negative troponins. EF of 55-60% on lastest 2D echo. Symptoms and mumur concerning for worsen ventral septal defect as patient has significant history for ASD repair and was told of a "pin hole" defect that was not repaired at the time. There is risk for symptoms worsening. Referral to cardiology placed for further evaluation and work-up. Obtain CBC and CMET.  Advised to seek emergency care if symptoms worsen prior to cardiology follow up.       Relevant Orders   Comprehensive metabolic panel   CBC   Ambulatory referral to Cardiology

## 2015-07-23 NOTE — Assessment & Plan Note (Addendum)
Waxing and waning chest pain with no evidence of ACS given previous hospital work-up and negative troponins. EF of 55-60% on lastest 2D echo. Symptoms and mumur concerning for worsen ventral septal defect as patient has significant history for ASD repair and was told of a "pin hole" defect that was not repaired at the time. There is risk for symptoms worsening. Referral to cardiology placed for further evaluation and work-up. Obtain CBC and CMET.  Advised to seek emergency care if symptoms worsen prior to cardiology follow up.

## 2015-07-23 NOTE — Patient Instructions (Signed)
Thank you for choosing [facility].  Summary/Instructions:  Please follow up with cardiology.  Please stop by the lab on the basement level of the building for your blood work. Your results will be released to [place] (or called to you) after review, usually within 72 hours after test completion. If any changes need to be made, you will be notified at that same time.  Please stop by radiology on the basement level of the building for your x-rays. Your results will be released to [place] (or called to you) after review, usually within 72 hours after test completion. If any treatments or changes are necessary, you will be notified at that same time.  Referrals have been made during this visit. You should expect to hear back from our schedulers in about 7-10 days in regards to establishing an appointment with the specialists we discussed.   If your symptoms worsen or fail to improve, please contact our office for further instruction, or in case of emergency go directly to the emergency room at the closest medical facility.

## 2015-07-23 NOTE — Progress Notes (Signed)
Pre visit review using our clinic review tool, if applicable. No additional management support is needed unless otherwise documented below in the visit note.  Refused flu shot 

## 2015-07-25 NOTE — Telephone Encounter (Signed)
Pt aware of results 

## 2015-07-31 ENCOUNTER — Encounter: Payer: Self-pay | Admitting: Physician Assistant

## 2015-07-31 ENCOUNTER — Ambulatory Visit (INDEPENDENT_AMBULATORY_CARE_PROVIDER_SITE_OTHER): Payer: Self-pay | Admitting: Physician Assistant

## 2015-07-31 VITALS — BP 124/70 | HR 55 | Ht 69.0 in | Wt 141.8 lb

## 2015-07-31 DIAGNOSIS — I340 Nonrheumatic mitral (valve) insufficiency: Secondary | ICD-10-CM

## 2015-07-31 DIAGNOSIS — R0789 Other chest pain: Secondary | ICD-10-CM

## 2015-07-31 NOTE — Patient Instructions (Addendum)
Medication Instructions:  Your physician recommends that you continue on your current medications as directed. Please refer to the Current Medication list given to you today.  Labwork: NONE  Testing/Procedures: Your physician has requested that you have a stress echocardiogram. For further information please visit [email or web address]. Please follow instruction sheet as given.  Follow-Up: Your physician wants you to follow-up in: 1 month with Dr. [name] or APP. You will receive a reminder letter in the mail two months in advance. If you don't receive a letter, please call our office to schedule the follow-up appointment.   If you need a refill on your cardiac medications before your next appointment, please call your pharmacy.

## 2015-07-31 NOTE — Progress Notes (Signed)
Cardiology Office Note   Date:  07/31/2015   ID:  Terry Santee., DOB [birth year], MRN [id number]  PCP:  Cathlean Cower, MD  Cardiologist:  Dr. Arlan Organ. [name]  Chief Complaint  Patient presents with  . Follow-up    Seen for Dr. Lovena Golden      History of Present Illness: Terry Marczak. is a 49 y.o. male who presents for post hospital followup. 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 His last cath was 01/1999 as preop assessment which showed normal coronaries. He was admitted from 12/13-12/14 with worsening SOB, lightheadedness, presyncope, and chest tightness. Chest tightness worsen when taking deep breath or laying flat. He has not seen a cardiologist since 2004, he used to follow Dr. Lovena Golden prior to surgery at [facility] in 2001 but never resumed care. Echo obtained during hospitalization was normal with EF 55-60%, normal WM, mild to moderate MR and no ASD or PFO identified. TSH was normal. He was discharged on following day 12/14.    Since his discharge he continue to have SOB esp with exertion, he also has occasional chest pressure as well. He denies any recurrent presyncope since last admission. He has followed by with his PCP who recommended cardiology followup. He denies any recent swelling.     Past Medical History  Diagnosis Date  . Rheumatic fever   . ASD (atrial septal defect)     large   . VSD (ventricular septal defect)     small  . Mitral valve disease 03/25/1999    s/p mitral valve repair with commissuroplasty   . Syncope     1999  . Congenital heart disease     ASVD, Cleft Mitral Valve  . 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 Cardiac surgery  03/25/1999    s/p partial AVSD repair and cleft mitral valve repair with commissuroplasty     Current Outpatient Prescriptions  Medication Sig Dispense Refill  . aspirin 325 MG  tablet Take 325 mg by mouth every 4 (four) hours as needed for pain (headache).     No current facility-administered medications for this visit.    Allergies:   Tensilon    Social History:  The patient  reports that he has been smoking Cigarettes.  He has been smoking about 0.50 packs per day. He has never used smokeless tobacco. He reports that he drinks alcohol. He reports that he does not use illicit drugs.   Family History:  The patient's family history includes Alcohol abuse in his other; Arthritis in his other and other; Diabetes in his sister; Heart attack in his father; Heart disease in his other, other, and sister; Hypertension in his other; Kidney disease in his other; Mental illness in his other and sister; Stroke in his father and other.    ROS:  Please see the history of present illness.   Otherwise, review of systems are positive for DOE, chest pressure.   All other systems are reviewed and negative.    PHYSICAL EXAM: VS:  BP 124/70 mmHg  Pulse 55  Ht 5\' 9"  (1.753 m)  Wt 141 lb 12.8 oz (64.32 kg)  BMI 20.93 kg/m2 , BMI Body mass index is 20.93 kg/(m^2). GEN: Well nourished, well developed, in no acute distress HEENT: normal Neck: no JVD, carotid bruits, or masses Cardiac: RRR; no rubs,  or gallops,no edema  [id number] systolic murmur Respiratory:  clear to auscultation bilaterally, normal work of breathing GI: soft, nontender, nondistended, + BS MS: no deformity or atrophy Skin: warm and dry, no rash Neuro:  Strength and sensation are intact Psych: euthymic mood, full affect   EKG:  EKG is ordered today. The ekg ordered today demonstrates sinus brady with occasional PAC   Recent Labs: 07/16/2015: B Natriuretic Peptide 8.3 07/17/2015: Magnesium 2.0; TSH 0.724 07/23/2015: ALT 12; BUN 13; Creatinine, Ser 0.99; Hemoglobin 14.2; Platelets 241.0; Potassium 4.3; Sodium 139    Lipid Panel    Component Value Date/Time   CHOL 145 05/16/2012 0908   TRIG 39.0 05/16/2012  0908   HDL 44.70 05/16/2012 0908   CHOLHDL 3 05/16/2012 0908   VLDL 7.8 05/16/2012 0908   LDLCALC 93 05/16/2012 0908      Wt Readings from Last 3 Encounters:  07/31/15 141 lb 12.8 oz (64.32 kg)  07/23/15 144 lb (65.318 kg)  03/17/12 142 lb 4 oz (64.524 kg)      Other studies Reviewed: Additional studies/ records that were reviewed today include:   Echo 07/17/2015 LV EF: 55% -  60%  ------------------------------------------------------------------- Indications:   Dyspnea 786.09.  ------------------------------------------------------------------- History:  PMH: Atrial septal defect. Ventricular septal Defect. Cleft Mitral valve. Rheumatic fever. Congenital heart disese.  ------------------------------------------------------------------- Study Conclusions  - Left ventricle: The cavity size was normal. Wall thickness was increased in a pattern of mild LVH. Systolic function was normal. The estimated ejection fraction was in the range of 55% to 60%. Wall motion was normal; there were no regional wall motion abnormalities. - Mitral valve: There was mild to moderate regurgitation. - Atrial septum: No defect or patent foramen ovale was identified.   Review of the above records demonstrates:  H/o rheumatic fever, ASD and mitral cleft repair in 2000. Recently admitted with chest pain.    ASSESSMENT AND PLAN:  1.  DOE with chest pressure  - no chest pain, but more pressure, last <15 min each. Does have FHx of early CAD. Last cath 01/1999 clean coronaries. Various options including coronary CT, stress test and stress echo discussed with patient and Dr. Caryl Comes, DOD. Will pursue stress echo which may provide additional information on exercise induced significant mitral regurg and wall motion abnormality.    2. H/o rheumatic fever  3. 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  The patient does not have concerns regarding medicines.  The following changes have been made:  no change  Labs/ tests ordered today include:   Orders Placed This Encounter  Procedures  . EKG 12-Lead  . Echo stress     Disposition:   FU with cardiology in 1 month  Hilbert Corrigan PA 07/31/2015 2:54 PM    [facility] [address], [place], [place]  [address] Phone: [phone]; Fax: [phone]

## 2015-08-19 ENCOUNTER — Ambulatory Visit (HOSPITAL_COMMUNITY): Payer: Self-pay | Attending: Physician Assistant

## 2015-08-19 ENCOUNTER — Encounter (HOSPITAL_COMMUNITY): Payer: Self-pay

## 2015-08-19 ENCOUNTER — Ambulatory Visit (HOSPITAL_COMMUNITY): Payer: Self-pay

## 2015-08-19 DIAGNOSIS — R0789 Other chest pain: Secondary | ICD-10-CM | POA: Insufficient documentation

## 2015-08-19 DIAGNOSIS — I340 Nonrheumatic mitral (valve) insufficiency: Secondary | ICD-10-CM | POA: Insufficient documentation

## 2015-08-19 DIAGNOSIS — R0600 Dyspnea, unspecified: Secondary | ICD-10-CM | POA: Insufficient documentation

## 2015-08-19 NOTE — Progress Notes (Signed)
Terry Golden presented for exercise treadmill stress echo this morning for WMA per [facility] request. The patient cannot get his target HR at stage 4. Exercise had to stop due to fatigue. After discussing the matter with DOD (Dr. Caryl Comes), he requested we should change protocol to checking for valvular function (MR). He also suggested other imaging modalities to evaluate WMA.

## 2015-08-20 LAB — ECHOCARDIOGRAM STRESS TEST
CHL CUP STRESS STAGE 1 DBP: 84 mmHg
CHL CUP STRESS STAGE 1 SBP: 121 mmHg
CHL CUP STRESS STAGE 2 HR: 64 {beats}/min
CHL CUP STRESS STAGE 3 SBP: 139 mmHg
CHL CUP STRESS STAGE 3 SPEED: 1.7 mph
CHL CUP STRESS STAGE 4 GRADE: 12 %
CHL CUP STRESS STAGE 4 HR: 88 {beats}/min
CHL CUP STRESS STAGE 4 SPEED: 2.5 mph
CHL CUP STRESS STAGE 5 DBP: 79 mmHg
CHL CUP STRESS STAGE 5 GRADE: 14 %
CHL CUP STRESS STAGE 5 HR: 104 {beats}/min
CHL CUP STRESS STAGE 5 SPEED: 3.4 mph
CHL CUP STRESS STAGE 6 DBP: 79 mmHg
CHL CUP STRESS STAGE 6 GRADE: 16 %
CHL CUP STRESS STAGE 6 HR: 134 {beats}/min
CHL CUP STRESS STAGE 6 SBP: 119 mmHg
CHL CUP STRESS STAGE 6 SPEED: 4.1 mph
CHL CUP STRESS STAGE 7 GRADE: 18 %
CHL CUP STRESS STAGE 7 HR: 137 {beats}/min
CHL CUP STRESS STAGE 8 HR: 93 {beats}/min
CHL CUP STRESS STAGE 8 SPEED: 0 mph
CHL CUP STRESS STAGE 9 GRADE: 0 %
CHL CUP STRESS STAGE 9 HR: 64 {beats}/min
CHL CUP STRESS STAGE 9 SBP: 131 mmHg
CHL CUP STRESS STAGE 9 SPEED: 0 mph
CSEPEW: 13.5 METS
CSEPPHR: 137 {beats}/min
Percent of predicted max HR: 80 %
Stage 1 Grade: 0 %
Stage 1 HR: 64 {beats}/min
Stage 1 Speed: 0 mph
Stage 2 Grade: 0 %
Stage 2 Speed: 0 mph
Stage 3 DBP: 80 mmHg
Stage 3 Grade: 10 %
Stage 3 HR: 81 {beats}/min
Stage 4 DBP: 77 mmHg
Stage 4 SBP: 140 mmHg
Stage 5 SBP: 143 mmHg
Stage 7 Speed: 5 mph
Stage 8 DBP: 76 mmHg
Stage 8 Grade: 0 %
Stage 8 SBP: 132 mmHg
Stage 9 DBP: 71 mmHg

## 2015-08-21 ENCOUNTER — Telehealth: Payer: Self-pay | Admitting: Interventional Cardiology

## 2015-08-21 NOTE — Telephone Encounter (Signed)
New message  ° ° °Patient calling for test results.   °

## 2015-08-21 NOTE — Telephone Encounter (Signed)
Called patient, advised that results had not been reviewed by the Dr yet.  Advised that he will be contacted afterwards.

## 2015-09-03 ENCOUNTER — Encounter: Payer: Self-pay | Admitting: Physician Assistant

## 2015-09-03 ENCOUNTER — Ambulatory Visit (INDEPENDENT_AMBULATORY_CARE_PROVIDER_SITE_OTHER): Payer: Self-pay | Admitting: Physician Assistant

## 2015-09-03 ENCOUNTER — Encounter: Payer: Self-pay | Admitting: Cardiovascular Disease

## 2015-09-03 ENCOUNTER — Telehealth: Payer: Self-pay | Admitting: Interventional Cardiology

## 2015-09-03 VITALS — BP 122/84 | HR 60 | Ht 69.0 in | Wt 143.4 lb

## 2015-09-03 DIAGNOSIS — R0609 Other forms of dyspnea: Secondary | ICD-10-CM

## 2015-09-03 DIAGNOSIS — R079 Chest pain, unspecified: Secondary | ICD-10-CM

## 2015-09-03 DIAGNOSIS — R002 Palpitations: Secondary | ICD-10-CM

## 2015-09-03 NOTE — Telephone Encounter (Signed)
Lmom. Letter pt rqst will be left at the front desk for pick up.  Letter reads:  Terry Golden is currently undergoing cardiac workup, we recommend limit strenuous activity or lifting > 10 lbs until after cardiac workup is completed.   Sincerely,  Almyra Deforest PA-C

## 2015-09-03 NOTE — Progress Notes (Signed)
Cardiology Office Note   Date:  09/03/2015   ID:  Terry Golden., DOB June 22, 1966, MRN [id number]  PCP:  Cathlean Cower, MD  Cardiologist:  Dr. Arlan Organ. [name]  Chief Complaint  Patient presents with  . Follow-up    seen for Dr. Lovena Le  . Chest Pain  . Fatigue      History of Present Illness: Terry Golden. is a very pleasant 50 y.o. male who presents for followup. 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 His last cath was 01/1999 as preop assessment which showed normal coronaries. He was admitted from 12/13-12/14 with worsening SOB, lightheadedness, presyncope, and chest tightness. Chest tightness worsen when taking deep breath or laying flat. He has not seen a cardiologist since 2004, he used to follow Dr. Lovena Le prior to surgery at [facility] in 2001 but never resumed care. Echo obtained during hospitalization was normal with EF 55-60%, normal WM, mild to moderate MR and no ASD or PFO identified. TSH was normal. He was discharged on following day 12/14.Despite negative workup as inpatient, he continue to have same symptom. I obtained outpatient stress echo, unfortunately, he was unable to reach target of 170, however we were able to assess his mitral valve during exercise, there does not appear to be significant increase in mitral regurgitation with exercise.  He presents today for folowup. He says he continue to have SOB and chest tightness when climbing stairs. He also endorse occasional irregular heart rate at rest and with exertion that causes dizziness. He is worried about his symptom since his father died of CAD s/p CABG and required shock 8 times before he passed away.     Past Medical History  Diagnosis Date  . Rheumatic fever   . ASD (atrial septal defect)     large   . VSD (ventricular septal defect)     small  . Mitral valve disease 03/25/1999    s/p mitral valve repair with  commissuroplasty   . Syncope     1999  . Congenital heart disease     ASVD, Cleft Mitral Valve  . 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 Cardiac surgery  03/25/1999    s/p partial AVSD repair and cleft mitral valve repair with commissuroplasty     Current Outpatient Prescriptions  Medication Sig Dispense Refill  . aspirin 325 MG tablet Take 325 mg by mouth every 4 (four) hours as needed for pain (headache).     No current facility-administered medications for this visit.    Allergies:   Tensilon    Social History:  The patient  reports that he has been smoking Cigarettes.  He has been smoking about 0.50 packs per day. He has never used smokeless tobacco. He reports that he drinks alcohol. He reports that he does not use illicit drugs.   Family History:  The patient's family history includes Alcohol abuse in his other; Arthritis in his other and other; Diabetes in his sister; Heart attack in his father; Heart disease in his other, other, and sister; Hypertension in his other; Kidney disease in his other; Mental illness in his other and sister; Stroke in his father and other.    ROS:  Please see the history of present illness.   Otherwise, review of systems are positive for chest pressure with exertion, DOE, and palpitation/presyncope.  All other systems are reviewed and negative.    PHYSICAL EXAM: VS:  BP 122/84 mmHg  Pulse 60  Ht 5\' 9"  (1.753 m)  Wt 143 lb 6.4 oz (65.046 kg)  BMI 21.17 kg/m2 , BMI Body mass index is 21.17 kg/(m^2). GEN: Well nourished, well developed, in no acute distress HEENT: normal Neck: no JVD, carotid bruits, or masses Cardiac: RRR; no murmurs, rubs, or gallops,no edema  Respiratory:  clear to auscultation bilaterally, normal work of breathing GI: soft, nontender, nondistended, + BS MS: no deformity or atrophy Skin: warm and dry, no rash Neuro:  Strength and sensation are intact Psych:  euthymic mood, full affect   EKG:  EKG is ordered today. The ekg ordered today demonstrates NSR with TWI in inferior lead unchanged since at least 2013   Recent Labs: 07/16/2015: B Natriuretic Peptide 8.3 07/17/2015: Magnesium 2.0; TSH 0.724 07/23/2015: ALT 12; BUN 13; Creatinine, Ser 0.99; Hemoglobin 14.2; Platelets 241.0; Potassium 4.3; Sodium 139    Lipid Panel    Component Value Date/Time   CHOL 145 05/16/2012 0908   TRIG 39.0 05/16/2012 0908   HDL 44.70 05/16/2012 0908   CHOLHDL 3 05/16/2012 0908   VLDL 7.8 05/16/2012 0908   LDLCALC 93 05/16/2012 0908      Wt Readings from Last 3 Encounters:  09/03/15 143 lb 6.4 oz (65.046 kg)  07/31/15 141 lb 12.8 oz (64.32 kg)  07/23/15 144 lb (65.318 kg)      Other studies Reviewed: Additional studies/ records that were reviewed today include:   Stress echo 08/19/2015 - Test was originally ordered to evaluate for wall motion abnormalities.Patient exercised into stage 4 but heart rate was only 112 and he was not close to his target heart rate (PMHR 171). Therefore the study was changed. He exercise a second time and this time achieved a heart rate 139 at the end of stage 4, representing 81% of PMHR. This time his mitral regurgitation was evaluated with exercise. The patient has moderate mitral regurgitation at rest. This does not appear to change appreciably with treadmill stress. MR volume at heart rate of 75 bpm ranges between 23-30 ml.  Echocardiogram 07/17/2015 LV EF: 55% -  60%  ------------------------------------------------------------------- Indications:   Dyspnea 786.09.  ------------------------------------------------------------------- History:  PMH: Atrial septal defect. Ventricular septal Defect. Cleft Mitral valve. Rheumatic fever. Congenital heart disese.  ------------------------------------------------------------------- Study Conclusions  - Left ventricle: The cavity size  was normal. Wall thickness was increased in a pattern of mild LVH. Systolic function was normal. The estimated ejection fraction was in the range of 55% to 60%. Wall motion was normal; there were no regional wall motion abnormalities. - Mitral valve: There was mild to moderate regurgitation. - Atrial septum: No defect or patent foramen ovale was identified.   Review of the above records demonstrates:  Admitted in 07/2015 with chest pain, SOB and syncope. Echo showed stable valve and mild to moderate MR. Recent stress echo shows no change in moderate MR with exertion   ASSESSMENT AND PLAN:  1. DOE and chest tightness: could not reach target HR on recent stress echo, however was able to identify no significant increase in MR with exercise, I doubt his symptom is coming from his valves. He is 1 month out from hospitalization and still has symptom, he has got over previous cold. I will obtain coronary CT to r/o CAD. Note he had clean cath in 2000. His thin body habitus should make him a good candidate for coronary CT. Obtain fasting  Lipid Panel. Last Lipid panel in 2013 was normal  2. Palpitation and presyncope: no obvious syncope since last admission. Continue to have occasional presyncope. BP stable on followup. HR 60. Unable to r/o arrythmia. He says he had the palpitation and feeling of passing out in ED during last admission, however I could not find any documentation by ED nurse regarding the episode. I will obtain 30 day event monitor  3. H/o rheumatic fever  4. 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 Tobacco abuse: ongoing, but has cut back. Discussed need for tobacco cessation   Current medicines are reviewed at length with the patient today.  The patient does not have concerns regarding medicines.  The following changes have been made:  no change  Labs/ tests ordered today include:   Orders Placed This  Encounter  Procedures  . CT Coronary Morp W/Cta Cor W/Score W/Ca W/Cm &/Or Wo/Cm  . Lipid panel  . Cardiac event monitor  . EKG 12-Lead     Disposition:   FU with Dr. [name] in 2 months  Signed, Terry Golden, [place]  09/03/2015 10:11 AM    [facility], [place], [place]  [address] Phone: [phone]; Fax: [phone]

## 2015-09-03 NOTE — Telephone Encounter (Signed)
Pt states to get medicaid his financial advisor through [place] [employer] said he needs something in righting that states he can or can not work. I will forward information to H. Meng PA and Lattie Haw CMA to review and call pt back to advise.

## 2015-09-03 NOTE — Telephone Encounter (Signed)
New message    Patient was seen in the office today by Billey Chang .Marland Kitchen Did not disclose any information want to give some information to the nurse.

## 2015-09-03 NOTE — Patient Instructions (Addendum)
Medication Instructions:  Your physician recommends that you continue on your current medications as directed. Please refer to the Current Medication list given to you today.   Labwork: Your physician recommends that you return for a FASTING lipid profile: 1 month   Testing/Procedures: (Coronary CT w/calcium score) Non-Cardiac CT scanning, (CAT scanning), is a noninvasive, special x-ray that produces cross-sectional images of the body using x-rays and a computer. CT scans help physicians diagnose and treat medical conditions. For some CT exams, a contrast material is used to enhance visibility in the area of the body being studied. CT scans provide greater clarity and reveal more details than regular x-ray exams.  Your physician has recommended that you wear an event monitor. Event monitors are medical devices that record the heart's electrical activity. Doctors most often [place] these monitors to diagnose arrhythmias. Arrhythmias are problems with the speed or rhythm of the heartbeat. The monitor is a small, portable device. You can wear one while you do your normal daily activities. This is usually used to diagnose what is causing palpitations/syncope (passing out).       Follow-Up: Your physician recommends that you schedule a follow-up appointment in: 6-8 weeks with Dr.Varanasi   Any Other Special Instructions Will Be Listed Below (If Applicable).     If you need a refill on your cardiac medications before your next appointment, please call your pharmacy.

## 2015-09-11 ENCOUNTER — Ambulatory Visit (HOSPITAL_COMMUNITY)
Admission: RE | Admit: 2015-09-11 | Discharge: 2015-09-11 | Disposition: A | Discharge status: Home / Self Care | Payer: Self-pay | Source: Ambulatory Visit | Attending: Physician Assistant | Admitting: Physician Assistant

## 2015-09-11 ENCOUNTER — Encounter (HOSPITAL_COMMUNITY): Payer: Self-pay

## 2015-09-11 DIAGNOSIS — I491 Atrial premature depolarization: Secondary | ICD-10-CM | POA: Insufficient documentation

## 2015-09-11 DIAGNOSIS — R079 Chest pain, unspecified: Secondary | ICD-10-CM

## 2015-09-11 DIAGNOSIS — R0609 Other forms of dyspnea: Secondary | ICD-10-CM | POA: Insufficient documentation

## 2015-09-11 DIAGNOSIS — I517 Cardiomegaly: Secondary | ICD-10-CM | POA: Insufficient documentation

## 2015-09-11 DIAGNOSIS — J439 Emphysema, unspecified: Secondary | ICD-10-CM | POA: Insufficient documentation

## 2015-09-11 DIAGNOSIS — R002 Palpitations: Secondary | ICD-10-CM | POA: Insufficient documentation

## 2015-09-11 DIAGNOSIS — R911 Solitary pulmonary nodule: Secondary | ICD-10-CM | POA: Insufficient documentation

## 2015-09-11 MED ORDER — IOHEXOL 350 MG/ML SOLN
80.0000 mL | Freq: Once | INTRAVENOUS | Status: AC | PRN
  Administered 2015-09-11: 80 mL via INTRAVENOUS

## 2015-09-11 NOTE — Progress Notes (Signed)
CT scan completed. Tolerated well. D/C home walking with mother. Awake and alert. In no distress. 

## 2015-09-13 ENCOUNTER — Other Ambulatory Visit: Payer: Self-pay

## 2015-09-13 DIAGNOSIS — Z01810 Encounter for preprocedural cardiovascular examination: Secondary | ICD-10-CM

## 2015-09-13 DIAGNOSIS — R9389 Abnormal findings on diagnostic imaging of other specified body structures: Secondary | ICD-10-CM

## 2015-09-17 ENCOUNTER — Other Ambulatory Visit (INDEPENDENT_AMBULATORY_CARE_PROVIDER_SITE_OTHER): Payer: Self-pay | Admitting: *Deleted

## 2015-09-17 DIAGNOSIS — R9389 Abnormal findings on diagnostic imaging of other specified body structures: Secondary | ICD-10-CM

## 2015-09-17 DIAGNOSIS — Z01810 Encounter for preprocedural cardiovascular examination: Secondary | ICD-10-CM

## 2015-09-17 LAB — PROTIME-INR
INR: 1.12 (ref <1.50)
PROTHROMBIN TIME: 14.5 s (ref 11.6–15.2)

## 2015-09-17 LAB — CBC WITH DIFFERENTIAL/PLATELET
BASOS PCT: 0 % (ref 0–1)
Basophils Absolute: 0 10*3/uL (ref 0.0–0.1)
EOS PCT: 9 % — AB (ref 0–5)
Eosinophils Absolute: 0.5 10*3/uL (ref 0.0–0.7)
HCT: 39.4 % (ref 39.0–52.0)
Hemoglobin: 13.5 g/dL (ref 13.0–17.0)
Lymphocytes Relative: 30 % (ref 12–46)
Lymphs Abs: 1.6 10*3/uL (ref 0.7–4.0)
MCH: 32.6 pg (ref 26.0–34.0)
MCHC: 34.3 g/dL (ref 30.0–36.0)
MCV: 95.2 fL (ref 78.0–100.0)
MONO ABS: 0.3 10*3/uL (ref 0.1–1.0)
MPV: 9.9 fL (ref 8.6–12.4)
Monocytes Relative: 5 % (ref 3–12)
Neutro Abs: 3 10*3/uL (ref 1.7–7.7)
Neutrophils Relative %: 56 % (ref 43–77)
PLATELETS: 252 10*3/uL (ref 150–400)
RBC: 4.14 MIL/uL — AB (ref 4.22–5.81)
RDW: 13.3 % (ref 11.5–15.5)
WBC: 5.4 10*3/uL (ref 4.0–10.5)

## 2015-09-17 LAB — BASIC METABOLIC PANEL
BUN: 8 mg/dL (ref 7–25)
CALCIUM: 9.2 mg/dL (ref 8.6–10.3)
CO2: 26 mmol/L (ref 20–31)
Chloride: 106 mmol/L (ref 98–110)
Creat: 1.08 mg/dL (ref 0.70–1.33)
GLUCOSE: 86 mg/dL (ref 65–99)
Potassium: 4.3 mmol/L (ref 3.5–5.3)
SODIUM: 139 mmol/L (ref 135–146)

## 2015-09-18 ENCOUNTER — Other Ambulatory Visit: Payer: Self-pay | Admitting: Interventional Cardiology

## 2015-09-18 DIAGNOSIS — R072 Precordial pain: Secondary | ICD-10-CM

## 2015-09-20 ENCOUNTER — Ambulatory Visit (HOSPITAL_COMMUNITY)
Admission: RE | Admit: 2015-09-20 | Discharge: 2015-09-20 | Disposition: A | Discharge status: Home / Self Care | Payer: MEDICAID | Source: Ambulatory Visit | Attending: Interventional Cardiology | Admitting: Interventional Cardiology

## 2015-09-20 ENCOUNTER — Encounter (HOSPITAL_COMMUNITY): Payer: Self-pay | Admitting: Interventional Cardiology

## 2015-09-20 ENCOUNTER — Encounter (HOSPITAL_COMMUNITY)
Admission: RE | Disposition: A | Discharge status: Home / Self Care | Payer: Self-pay | Source: Ambulatory Visit | Attending: Interventional Cardiology

## 2015-09-20 DIAGNOSIS — R55 Syncope and collapse: Secondary | ICD-10-CM | POA: Insufficient documentation

## 2015-09-20 DIAGNOSIS — Z7982 Long term (current) use of aspirin: Secondary | ICD-10-CM | POA: Insufficient documentation

## 2015-09-20 DIAGNOSIS — R072 Precordial pain: Secondary | ICD-10-CM

## 2015-09-20 DIAGNOSIS — R002 Palpitations: Secondary | ICD-10-CM | POA: Insufficient documentation

## 2015-09-20 DIAGNOSIS — I2510 Atherosclerotic heart disease of native coronary artery without angina pectoris: Secondary | ICD-10-CM | POA: Insufficient documentation

## 2015-09-20 DIAGNOSIS — Z8774 Personal history of (corrected) congenital malformations of heart and circulatory system: Secondary | ICD-10-CM | POA: Insufficient documentation

## 2015-09-20 DIAGNOSIS — Z952 Presence of prosthetic heart valve: Secondary | ICD-10-CM | POA: Insufficient documentation

## 2015-09-20 DIAGNOSIS — F17210 Nicotine dependence, cigarettes, uncomplicated: Secondary | ICD-10-CM | POA: Insufficient documentation

## 2015-09-20 DIAGNOSIS — Z8249 Family history of ischemic heart disease and other diseases of the circulatory system: Secondary | ICD-10-CM | POA: Insufficient documentation

## 2015-09-20 DIAGNOSIS — R938 Abnormal findings on diagnostic imaging of other specified body structures: Secondary | ICD-10-CM

## 2015-09-20 HISTORY — PX: CARDIAC CATHETERIZATION: SHX172

## 2015-09-20 LAB — POCT I-STAT 3, VENOUS BLOOD GAS (G3P V)
Acid-base deficit: 3 mmol/L — ABNORMAL HIGH (ref 0.0–2.0)
Bicarbonate: 23.9 mEq/L (ref 20.0–24.0)
O2 SAT: 66 %
PCO2 VEN: 49.6 mmHg (ref 45.0–50.0)
TCO2: 25 mmol/L (ref 0–100)
pH, Ven: 7.291 (ref 7.250–7.300)
pO2, Ven: 38 mmHg (ref 30.0–45.0)

## 2015-09-20 LAB — POCT I-STAT 3, ART BLOOD GAS (G3+)
Acid-base deficit: 3 mmol/L — ABNORMAL HIGH (ref 0.0–2.0)
BICARBONATE: 23 meq/L (ref 20.0–24.0)
O2 Saturation: 98 %
PH ART: 7.328 — AB (ref 7.350–7.450)
TCO2: 24 mmol/L (ref 0–100)
pCO2 arterial: 43.8 mmHg (ref 35.0–45.0)
pO2, Arterial: 117 mmHg — ABNORMAL HIGH (ref 80.0–100.0)

## 2015-09-20 SURGERY — RIGHT/LEFT HEART CATH AND CORONARY ANGIOGRAPHY
Anesthesia: LOCAL

## 2015-09-20 MED ORDER — SODIUM CHLORIDE 0.9 % IV SOLN: 250.0000 mL | INTRAVENOUS | Status: DC | PRN

## 2015-09-20 MED ORDER — SODIUM CHLORIDE 0.9 % WEIGHT BASED INFUSION: 1.0000 mL/kg/h | INTRAVENOUS | Status: DC

## 2015-09-20 MED ORDER — FENTANYL CITRATE (PF) 100 MCG/2ML IJ SOLN
INTRAMUSCULAR | Status: DC | PRN
  Administered 2015-09-20 (×2): 25 ug via INTRAVENOUS

## 2015-09-20 MED ORDER — MIDAZOLAM HCL 2 MG/2ML IJ SOLN
INTRAMUSCULAR | Status: AC
  Filled 2015-09-20: qty 2

## 2015-09-20 MED ORDER — ASPIRIN 81 MG PO CHEW
CHEWABLE_TABLET | ORAL | Status: AC
  Administered 2015-09-20: 81 mg
  Filled 2015-09-20: qty 1

## 2015-09-20 MED ORDER — SODIUM CHLORIDE 0.9% FLUSH: 3.0000 mL | INTRAVENOUS | Status: DC | PRN

## 2015-09-20 MED ORDER — VERAPAMIL HCL 2.5 MG/ML IV SOLN
INTRAVENOUS | Status: DC | PRN
  Administered 2015-09-20: 10 mL via INTRA_ARTERIAL

## 2015-09-20 MED ORDER — IOHEXOL 350 MG/ML SOLN
INTRAVENOUS | Status: DC | PRN
  Administered 2015-09-20: 80 mL via INTRA_ARTERIAL

## 2015-09-20 MED ORDER — FENTANYL CITRATE (PF) 100 MCG/2ML IJ SOLN
INTRAMUSCULAR | Status: AC
  Filled 2015-09-20: qty 2

## 2015-09-20 MED ORDER — HEPARIN SODIUM (PORCINE) 1000 UNIT/ML IJ SOLN
INTRAMUSCULAR | Status: AC
  Filled 2015-09-20: qty 1

## 2015-09-20 MED ORDER — NITROGLYCERIN 1 MG/10 ML FOR IR/CATH LAB
INTRA_ARTERIAL | Status: AC
  Filled 2015-09-20: qty 10

## 2015-09-20 MED ORDER — SODIUM CHLORIDE 0.9 % WEIGHT BASED INFUSION
3.0000 mL/kg/h | INTRAVENOUS | Status: DC
  Administered 2015-09-20: 3 mL/kg/h via INTRAVENOUS

## 2015-09-20 MED ORDER — ASPIRIN 81 MG PO CHEW: 81.0000 mg | CHEWABLE_TABLET | ORAL | Status: DC

## 2015-09-20 MED ORDER — LIDOCAINE HCL (PF) 1 % IJ SOLN
INTRAMUSCULAR | Status: AC
  Filled 2015-09-20: qty 30

## 2015-09-20 MED ORDER — SODIUM CHLORIDE 0.9% FLUSH: 3.0000 mL | Freq: Two times a day (BID) | INTRAVENOUS | Status: DC

## 2015-09-20 MED ORDER — MIDAZOLAM HCL 2 MG/2ML IJ SOLN
INTRAMUSCULAR | Status: DC | PRN
  Administered 2015-09-20: 1 mg via INTRAVENOUS
  Administered 2015-09-20: 2 mg via INTRAVENOUS

## 2015-09-20 MED ORDER — VERAPAMIL HCL 2.5 MG/ML IV SOLN
INTRAVENOUS | Status: AC
  Filled 2015-09-20: qty 2

## 2015-09-20 MED ORDER — HEPARIN (PORCINE) IN NACL 2-0.9 UNIT/ML-% IJ SOLN
INTRAMUSCULAR | Status: DC | PRN
  Administered 2015-09-20: 1500 mL

## 2015-09-20 MED ORDER — HEPARIN (PORCINE) IN NACL 2-0.9 UNIT/ML-% IJ SOLN
INTRAMUSCULAR | Status: AC
  Filled 2015-09-20: qty 1000

## 2015-09-20 MED ORDER — HEPARIN SODIUM (PORCINE) 1000 UNIT/ML IJ SOLN
INTRAMUSCULAR | Status: DC | PRN
  Administered 2015-09-20: 4000 [IU] via INTRAVENOUS

## 2015-09-20 MED ORDER — HEPARIN (PORCINE) IN NACL 2-0.9 UNIT/ML-% IJ SOLN
INTRAMUSCULAR | Status: AC
  Filled 2015-09-20: qty 500

## 2015-09-20 MED ORDER — LIDOCAINE HCL (PF) 1 % IJ SOLN
INTRAMUSCULAR | Status: DC | PRN
  Administered 2015-09-20 (×2): 5 mL via INTRADERMAL
  Administered 2015-09-20: 15 mL via INTRADERMAL

## 2015-09-20 SURGICAL SUPPLY — 14 items
CATH BALLN WEDGE 5F 110CM (CATHETERS) ×1 IMPLANT
CATH IMPULSE 5F ANG/FL3.5 (CATHETERS) ×1 IMPLANT
CATH SWAN GANZ 7F STRAIGHT (CATHETERS) ×1 IMPLANT
DEVICE RAD COMP TR BAND LRG (VASCULAR PRODUCTS) ×2 IMPLANT
GLIDESHEATH SLEND SS 6F .021 (SHEATH) ×2 IMPLANT
KIT HEART LEFT (KITS) ×2 IMPLANT
KIT HEART RIGHT NAMIC (KITS) ×2 IMPLANT
PACK CARDIAC CATHETERIZATION (CUSTOM PROCEDURE TRAY) ×2 IMPLANT
SHEATH FAST CATH BRACH 5F 5CM (SHEATH) ×1 IMPLANT
SHEATH PINNACLE 7F 10CM (SHEATH) ×1 IMPLANT
SYR MEDRAD MARK V 150ML (SYRINGE) ×2 IMPLANT
TRANSDUCER W/STOPCOCK (MISCELLANEOUS) ×3 IMPLANT
TUBING CIL FLEX 10 FLL-RA (TUBING) ×2 IMPLANT
WIRE SAFE-T 1.5MM-J .035X260CM (WIRE) ×2 IMPLANT

## 2015-09-20 NOTE — Progress Notes (Signed)
Site area: RFV Site Prior to Removal:  Level 0 Pressure Applied For:15 min Manual:   yes Patient Status During Pull:  Stable  Post Pull Site:  Level 0 Post Pull Instructions Given:  yes Post Pull Pulses Present: palpable Dressing Applied:  clear Bedrest begins @ 1035 Comments:

## 2015-09-20 NOTE — H&P (View-Only) (Signed)
Cardiology Office Note   Date:  09/03/2015   ID:  Terry Santee., DOB [birth year], MRN [id number]  PCP:  Cathlean Cower, MD  Cardiologist:  Dr. Arlan Organ. [name]  Chief Complaint  Patient presents with  . Follow-up    seen for Dr. Lovena Le  . Chest Pain  . Fatigue      History of Present Illness: Terry Golden. is a very pleasant 50 y.o. male who presents for followup. 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 His last cath was 01/1999 as preop assessment which showed normal coronaries. He was admitted from 12/13-12/14 with worsening SOB, lightheadedness, presyncope, and chest tightness. Chest tightness worsen when taking deep breath or laying flat. He has not seen a cardiologist since 2004, he used to follow Dr. Lovena Le prior to surgery at [facility] in 2001 but never resumed care. Echo obtained during hospitalization was normal with EF 55-60%, normal WM, mild to moderate MR and no ASD or PFO identified. TSH was normal. He was discharged on following day 12/14.Despite negative workup as inpatient, he continue to have same symptom. I obtained outpatient stress echo, unfortunately, he was unable to reach target of 170, however we were able to assess his mitral valve during exercise, there does not appear to be significant increase in mitral regurgitation with exercise.  He presents today for folowup. He says he continue to have SOB and chest tightness when climbing stairs. He also endorse occasional irregular heart rate at rest and with exertion that causes dizziness. He is worried about his symptom since his father died of CAD s/p CABG and required shock 8 times before he passed away.     Past Medical History  Diagnosis Date  . Rheumatic fever   . ASD (atrial septal defect)     large   . VSD (ventricular septal defect)     small  . Mitral valve disease 03/25/1999    s/p mitral valve repair with  commissuroplasty   . Syncope     1999  . Congenital heart disease     ASVD, Cleft Mitral Valve  . 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 Cardiac surgery  03/25/1999    s/p partial AVSD repair and cleft mitral valve repair with commissuroplasty     Current Outpatient Prescriptions  Medication Sig Dispense Refill  . aspirin 325 MG tablet Take 325 mg by mouth every 4 (four) hours as needed for pain (headache).     No current facility-administered medications for this visit.    Allergies:   Tensilon    Social History:  The patient  reports that he has been smoking Cigarettes.  He has been smoking about 0.50 packs per day. He has never used smokeless tobacco. He reports that he drinks alcohol. He reports that he does not use illicit drugs.   Family History:  The patient's family history includes Alcohol abuse in his other; Arthritis in his other and other; Diabetes in his sister; Heart attack in his father; Heart disease in his other, other, and sister; Hypertension in his other; Kidney disease in his other; Mental illness in his other and sister; Stroke in his father and other.    ROS:  Please see the history of present illness.   Otherwise, review of systems are positive for chest pressure with exertion, DOE, and palpitation/presyncope.  All other systems are reviewed and negative.    PHYSICAL EXAM: VS:  BP 122/84 mmHg  Pulse 60  Ht 5\' 9"  (1.753 m)  Wt 143 lb 6.4 oz (65.046 kg)  BMI 21.17 kg/m2 , BMI Body mass index is 21.17 kg/(m^2). GEN: Well nourished, well developed, in no acute distress HEENT: normal Neck: no JVD, carotid bruits, or masses Cardiac: RRR; no murmurs, rubs, or gallops,no edema  Respiratory:  clear to auscultation bilaterally, normal work of breathing GI: soft, nontender, nondistended, + BS MS: no deformity or atrophy Skin: warm and dry, no rash Neuro:  Strength and sensation are intact Psych:  euthymic mood, full affect   EKG:  EKG is ordered today. The ekg ordered today demonstrates NSR with TWI in inferior lead unchanged since at least 2013   Recent Labs: 07/16/2015: B Natriuretic Peptide 8.3 07/17/2015: Magnesium 2.0; TSH 0.724 07/23/2015: ALT 12; BUN 13; Creatinine, Ser 0.99; Hemoglobin 14.2; Platelets 241.0; Potassium 4.3; Sodium 139    Lipid Panel    Component Value Date/Time   CHOL 145 05/16/2012 0908   TRIG 39.0 05/16/2012 0908   HDL 44.70 05/16/2012 0908   CHOLHDL 3 05/16/2012 0908   VLDL 7.8 05/16/2012 0908   LDLCALC 93 05/16/2012 0908      Wt Readings from Last 3 Encounters:  09/03/15 143 lb 6.4 oz (65.046 kg)  07/31/15 141 lb 12.8 oz (64.32 kg)  07/23/15 144 lb (65.318 kg)      Other studies Reviewed: Additional studies/ records that were reviewed today include:   Stress echo 08/19/2015 - Test was originally ordered to evaluate for wall motion abnormalities.Patient exercised into stage 4 but heart rate was only 112 and he was not close to his target heart rate (PMHR 171). Therefore the study was changed. He exercise a second time and this time achieved a heart rate 139 at the end of stage 4, representing 81% of PMHR. This time his mitral regurgitation was evaluated with exercise. The patient has moderate mitral regurgitation at rest. This does not appear to change appreciably with treadmill stress. MR volume at heart rate of 75 bpm ranges between 23-30 ml.  Echocardiogram 07/17/2015 LV EF: 55% -  60%  ------------------------------------------------------------------- Indications:   Dyspnea 786.09.  ------------------------------------------------------------------- History:  PMH: Atrial septal defect. Ventricular septal Defect. Cleft Mitral valve. Rheumatic fever. Congenital heart disese.  ------------------------------------------------------------------- Study Conclusions  - Left ventricle: The cavity size  was normal. Wall thickness was increased in a pattern of mild LVH. Systolic function was normal. The estimated ejection fraction was in the range of 55% to 60%. Wall motion was normal; there were no regional wall motion abnormalities. - Mitral valve: There was mild to moderate regurgitation. - Atrial septum: No defect or patent foramen ovale was identified.   Review of the above records demonstrates:  Admitted in 07/2015 with chest pain, SOB and syncope. Echo showed stable valve and mild to moderate MR. Recent stress echo shows no change in moderate MR with exertion   ASSESSMENT AND PLAN:  1. DOE and chest tightness: could not reach target HR on recent stress echo, however was able to identify no significant increase in MR with exercise, I doubt his symptom is coming from his valves. He is 1 month out from hospitalization and still has symptom, he has got over previous cold. I will obtain coronary CT to r/o CAD. Note he had clean cath in 2000. His thin body habitus should make him a good candidate for coronary CT. Obtain fasting  Lipid Panel. Last Lipid panel in 2013 was normal  2. Palpitation and presyncope: no obvious syncope since last admission. Continue to have occasional presyncope. BP stable on followup. HR 60. Unable to r/o arrythmia. He says he had the palpitation and feeling of passing out in ED during last admission, however I could not find any documentation by ED nurse regarding the episode. I will obtain 30 day event monitor  3. H/o rheumatic fever  4. 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 Tobacco abuse: ongoing, but has cut back. Discussed need for tobacco cessation   Current medicines are reviewed at length with the patient today.  The patient does not have concerns regarding medicines.  The following changes have been made:  no change  Labs/ tests ordered today include:   Orders Placed This  Encounter  Procedures  . CT Coronary Morp W/Cta Cor W/Score W/Ca W/Cm &/Or Wo/Cm  . Lipid panel  . Cardiac event monitor  . EKG 12-Lead     Disposition:   FU with Dr. [name] in 2 months  Signed, Almyra Deforest, [place]  09/03/2015 10:11 AM    [address], [place], [place]  [address] Phone: [phone]; Fax: [phone]

## 2015-09-20 NOTE — Discharge Instructions (Signed)
Radial Site Care °Refer to this sheet in the next few weeks. These instructions provide you with information about caring for yourself after your procedure. Your health care provider may also give you more specific instructions. Your treatment has been planned according to current medical practices, but problems sometimes occur. Call your health care provider if you have any problems or questions after your procedure. °WHAT TO EXPECT AFTER THE PROCEDURE °After your procedure, it is typical to have the following: °· Bruising at the radial site that usually fades within 1-2 weeks. °· Blood collecting in the tissue (hematoma) that may be painful to the touch. It should usually decrease in size and tenderness within 1-2 weeks. °HOME CARE INSTRUCTIONS °· Take medicines only as directed by your health care provider. °· You may shower 24-48 hours after the procedure or as directed by your health care provider. Remove the bandage (dressing) and gently wash the site with plain soap and water. Pat the area dry with a clean towel. Do not rub the site, because this may cause bleeding. °· Do not take baths, swim, or use a hot tub until your health care provider approves. °· Check your insertion site every day for redness, swelling, or drainage. °· Do not apply powder or lotion to the site. °· Do not flex or bend the affected arm for 24 hours or as directed by your health care provider. °· Do not push or pull heavy objects with the affected arm for 24 hours or as directed by your health care provider. °· Do not lift over 10 lb (4.5 kg) for 5 days after your procedure or as directed by your health care provider. °· Ask your health care provider when it is okay to: °¨ Return to work or school. °¨ Resume usual physical activities or sports. °¨ Resume sexual activity. °· Do not drive home if you are discharged the same day as the procedure. Have someone else drive you. °· You may drive 24 hours after the procedure unless otherwise  instructed by your health care provider. °· Do not operate machinery or power tools for 24 hours after the procedure. °· If your procedure was done as an outpatient procedure, which means that you went home the same day as your procedure, a responsible adult should be with you for the first 24 hours after you arrive home. °· Keep all follow-up visits as directed by your health care provider. This is important. °SEEK MEDICAL CARE IF: °· You have a fever. °· You have chills. °· You have increased bleeding from the radial site. Hold pressure on the site. °SEEK IMMEDIATE MEDICAL CARE IF: °· You have unusual pain at the radial site. °· You have redness, warmth, or swelling at the radial site. °· You have drainage (other than a small amount of blood on the dressing) from the radial site. °· The radial site is bleeding, and the bleeding does not stop after 30 minutes of holding steady pressure on the site. °· Your arm or hand becomes pale, cool, tingly, or numb. °  °This information is not intended to replace advice given to you by your health care provider. Make sure you discuss any questions you have with your health care provider. °  °Document Released: 08/22/2010 Document Revised: 08/10/2014 Document Reviewed: 02/05/2014 °Elsevier Interactive Patient Education ©2016 [employer] Inc[employer] °Angiogram, Care After °These instructions give you information about caring for yourself after your procedure. Your doctor may also give you more specific instructions. Call your doctor if you   have any problems or questions after your procedure.  °HOME CARE °· Take medicines only as told by your doctor. °· Follow your doctor's instructions about: °· Care of the area where the tube was inserted. °· Bandage (dressing) changes and removal. °· You may shower 24-48 hours after the procedure or as told by your doctor. °· Do not take baths, swim, or use a hot tub until your doctor approves. °· Every day, check the area where the tube was inserted.  Watch for: °· Redness, swelling, or pain. °· Fluid, blood, or pus. °· Do not apply powder or lotion to the site. °· Do not lift anything that is heavier than 10 lb (4.5 kg) for 5 days or as told by your doctor. °· Ask your doctor when you can: °· Return to work or school. °· Do physical activities or play sports. °· Have sex. °· Do not drive or operate heavy machinery for 24 hours or as told by your doctor. °· Have someone with you for the first 24 hours after the procedure. °· Keep all follow-up visits as told by your doctor. This is important. °GET HELP IF: °· You have a fever.   °· You have chills.   °· You have more bleeding from the area where the tube was inserted. Hold pressure on the area. °· You have redness, swelling, or pain in the area where the tube was inserted. °· You have fluid or pus coming from the area. °GET HELP RIGHT AWAY IF:  °· You have a lot of pain in the area where the tube was inserted. °· The area where the tube was inserted is bleeding, and the bleeding does not stop after 30 minutes of holding steady pressure on the area. °· The area near or just beyond the insertion site becomes pale, cool, tingly, or numb. °  °This information is not intended to replace advice given to you by your health care provider. Make sure you discuss any questions you have with your health care provider. °  °Document Released: 10/16/2008 Document Revised: 08/10/2014 Document Reviewed: 12/21/2012 °Elsevier Interactive Patient Education ©2016 Elsevier Inc. ° °

## 2015-09-20 NOTE — Interval H&P Note (Signed)
Cath Lab Visit (complete for each Cath Lab visit)  Clinical Evaluation Leading to the Procedure:   ACS: No.  Non-ACS:    Anginal Classification: CCS III  Anti-ischemic medical therapy: No Therapy  Non-Invasive Test Results: High-risk stress test findings: cardiac mortality >3%/year  Prior CABG: No previous CABG  Disease in the proximal/ ostial LAD on CT scan.    History and Physical Interval Note:  09/20/2015 8:44 AM  Terry Golden.  has presented today for surgery, with the diagnosis of Abnormal Stress Test  The various methods of treatment have been discussed with the patient and family. After consideration of risks, benefits and other options for treatment, the patient has consented to  Procedure(s): Right/Left Heart Cath and Coronary Angiography (N/A) as a surgical intervention .  The patient's history has been reviewed, patient examined, no change in status, stable for surgery.  I have reviewed the patient's chart and labs.  Questions were answered to the patient's satisfaction.     Alfonso Shackett S.

## 2015-09-23 ENCOUNTER — Telehealth: Payer: Self-pay | Admitting: Interventional Cardiology

## 2015-09-23 NOTE — Telephone Encounter (Signed)
New message      Pt had a cath last Friday.  He cannot remember what the doctor said as to whether the results of the cath.  Please call.

## 2015-09-23 NOTE — Telephone Encounter (Signed)
CATH RESULTS  DISCUSSED WITH  PT  .Terry Golden

## 2015-10-01 ENCOUNTER — Other Ambulatory Visit: Payer: Self-pay

## 2015-11-19 ENCOUNTER — Ambulatory Visit: Payer: Self-pay | Admitting: Interventional Cardiology

## 2015-11-20 ENCOUNTER — Other Ambulatory Visit: Payer: Self-pay

## 2015-11-25 ENCOUNTER — Other Ambulatory Visit: Payer: Self-pay

## 2015-11-26 ENCOUNTER — Encounter: Payer: Self-pay | Admitting: Interventional Cardiology

## 2015-11-26 ENCOUNTER — Other Ambulatory Visit (INDEPENDENT_AMBULATORY_CARE_PROVIDER_SITE_OTHER): Payer: Self-pay | Admitting: *Deleted

## 2015-11-26 ENCOUNTER — Ambulatory Visit (INDEPENDENT_AMBULATORY_CARE_PROVIDER_SITE_OTHER): Payer: Self-pay | Admitting: Interventional Cardiology

## 2015-11-26 VITALS — BP 130/70 | HR 60 | Ht 69.0 in | Wt 146.8 lb

## 2015-11-26 DIAGNOSIS — Z720 Tobacco use: Secondary | ICD-10-CM

## 2015-11-26 DIAGNOSIS — F17200 Nicotine dependence, unspecified, uncomplicated: Secondary | ICD-10-CM

## 2015-11-26 DIAGNOSIS — I340 Nonrheumatic mitral (valve) insufficiency: Secondary | ICD-10-CM

## 2015-11-26 DIAGNOSIS — R072 Precordial pain: Secondary | ICD-10-CM

## 2015-11-26 DIAGNOSIS — Q211 Atrial septal defect, unspecified: Secondary | ICD-10-CM

## 2015-11-26 DIAGNOSIS — R0609 Other forms of dyspnea: Secondary | ICD-10-CM

## 2015-11-26 DIAGNOSIS — R079 Chest pain, unspecified: Secondary | ICD-10-CM

## 2015-11-26 DIAGNOSIS — I059 Rheumatic mitral valve disease, unspecified: Secondary | ICD-10-CM

## 2015-11-26 NOTE — Addendum Note (Signed)
Addended by: Eulis Foster on: 11/26/2015 01:59 PM   Modules accepted: Orders

## 2015-11-26 NOTE — Progress Notes (Signed)
Patient ID: Terry Santee., male   DOB: Apr 30, 1966, 50 y.o.   MRN: [id number]     Cardiology Office Note   Date:  11/26/2015   ID:  Terry Santee., DOB 03/17/1966, MRN [id number]  PCP:  Cathlean Cower, MD    No chief complaint on file. f/u ASD repair, cath   Wt Readings from Last 3 Encounters:  11/26/15 146 lb 12.8 oz (66.588 kg)  09/20/15 150 lb (68.04 kg)  09/03/15 143 lb 6.4 oz (65.046 kg)       History of Present Illness: Terry Fouse. is a 50 y.o. 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 His last cath was 01/1999 as preop assessment which showed normal coronaries. He was admitted from 12/13-12/14/16 with worsening SOB, lightheadedness, presyncope, and chest tightness.  Due to persistent chest pain, he had a cath in 2/17.  THis showed nonobstructive CAD.      Past Medical History  Diagnosis Date  . Rheumatic fever   . ASD (atrial septal defect)     large   . VSD (ventricular septal defect)     small  . Mitral valve disease 03/25/1999    s/p mitral valve repair with commissuroplasty   . Syncope     1999  . Congenital heart disease     ASVD, Cleft Mitral Valve  . 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 Cardiac surgery  03/25/1999    s/p partial AVSD repair and cleft mitral valve repair with commissuroplasty  . Cardiac catheterization N/A 09/20/2015    Procedure: Right/Left Heart Cath and Coronary Angiography;  Surgeon: Jettie Booze, MD;  Location: [facility];  Service: Cardiovascular;  Laterality: N/A;     Current Outpatient Prescriptions  Medication Sig Dispense Refill  . aspirin 325 MG tablet Take 325 mg by mouth every 4 (four) hours as needed for pain (headache).     No current facility-administered medications for this visit.    Allergies:   Tensilon    Social History:  The patient  reports that  he has been smoking Cigarettes.  He has been smoking about 0.50 packs per day. He has never used smokeless tobacco. He reports that he drinks alcohol. He reports that he does not use illicit drugs.   Family History:  The patient's family history includes Alcohol abuse in his other; Arthritis in his other and other; Diabetes in his sister; Heart attack in his father; Heart disease in his other, other, and sister; Hypertension in his other; Kidney disease in his other; Mental illness in his other and sister; Stroke in his father and other.    ROS:  Please see the history of present illness.   Otherwise, review of systems are positive for chest pain.   All other systems are reviewed and negative.    PHYSICAL EXAM: VS:  BP 130/70 mmHg  Pulse 60  Ht 5\' 9"  (1.753 m)  Wt 146 lb 12.8 oz (66.588 kg)  BMI 21.67 kg/m2 , BMI Body mass index is 21.67 kg/(m^2). GEN: Well nourished, well developed, in no acute distress HEENT: normal Neck: no JVD, carotid bruits, or masses Cardiac: RRR; no murmurs, rubs, or gallops,no edema  Respiratory:  clear to auscultation bilaterally, normal work of breathing GI: soft, nontender, nondistended, + BS MS: no deformity or atrophy Skin: warm  and dry, no rash Neuro:  Strength and sensation are intact Psych: euthymic mood, full affect   Recent Labs: 07/16/2015: B Natriuretic Peptide 8.3 07/17/2015: Magnesium 2.0; TSH 0.724 07/23/2015: ALT 12 09/17/2015: BUN 8; Creat 1.08; Hemoglobin 13.5; Platelets 252; Potassium 4.3; Sodium 139   Lipid Panel    Component Value Date/Time   CHOL 145 05/16/2012 0908   TRIG 39.0 05/16/2012 0908   HDL 44.70 05/16/2012 0908   CHOLHDL 3 05/16/2012 0908   VLDL 7.8 05/16/2012 0908   LDLCALC 93 05/16/2012 0908     Other studies Reviewed: Additional studies/ records that were reviewed today with results demonstrating: Cath results reviewed.   ASSESSMENT AND PLAN:  1. Mitral regurgitation: Mild to moderate mitral regurgitation. No  signs of CHF. Will repeat echocardiogram in December 2016.  2. Atypical chest pain: Persistent. I don't think is related to ischemia. Cath showed nonobstructive disease. 3. Tobacco abuse: I strongly urged him to quit smoking. He has cut back significantly but has not quit completely. He is not interested in any smoking cessation tools. He thinks he can do it on his own.   Current medicines are reviewed at length with the patient today.  The patient concerns regarding his medicines were addressed.  The following changes have been made:  No change  Labs/ tests ordered today include: echo in 2017 Dec No orders of the defined types were placed in this encounter.    Recommend 150 minutes/week of aerobic exercise Low fat, low carb, high fiber diet recommended  Disposition:   FU in 1 year   Teresita Madura., MD  11/26/2015 3:21 PM    [facility] [address], [place], [place]  [address] Phone: [phone]; Fax: [phone]

## 2015-11-26 NOTE — Patient Instructions (Signed)
Medication Instructions:  Same-no changes  Labwork: None  Testing/Procedures: Your physician has requested that you have an echocardiogram. Echocardiography is a painless test that uses sound waves to create images of your heart. It provides your doctor with information about the size and shape of your heart and how well your heart's chambers and valves are working. This procedure takes approximately one hour. There are no restrictions for this procedure.   Follow-Up: Your physician wants you to follow-up in: 1 year. You will receive a reminder letter in the mail two months in advance. If you don't receive a letter, please call our office to schedule the follow-up appointment.     If you need a refill on your cardiac medications before your next appointment, please call your pharmacy.   

## 2015-11-27 LAB — LIPID PANEL
CHOL/HDL RATIO: 3.1 ratio (ref <=5.0)
Cholesterol: 151 mg/dL (ref 125–200)
HDL: 49 mg/dL (ref >=40)
LDL Cholesterol: 92 mg/dL (ref <130)
TRIGLYCERIDES: 51 mg/dL (ref <150)
VLDL: 10 mg/dL (ref <30)

## 2015-12-05 ENCOUNTER — Emergency Department (HOSPITAL_COMMUNITY)
Admission: EM | Admit: 2015-12-05 | Discharge: 2015-12-05 | Disposition: A | Discharge status: Home / Self Care | Payer: Self-pay | Attending: Emergency Medicine | Admitting: Emergency Medicine

## 2015-12-05 ENCOUNTER — Encounter (HOSPITAL_COMMUNITY): Payer: Self-pay | Admitting: Emergency Medicine

## 2015-12-05 ENCOUNTER — Emergency Department (HOSPITAL_COMMUNITY): Payer: Self-pay

## 2015-12-05 DIAGNOSIS — S62639B Displaced fracture of distal phalanx of unspecified finger, initial encounter for open fracture: Secondary | ICD-10-CM

## 2015-12-05 DIAGNOSIS — Y929 Unspecified place or not applicable: Secondary | ICD-10-CM | POA: Insufficient documentation

## 2015-12-05 DIAGNOSIS — Z7982 Long term (current) use of aspirin: Secondary | ICD-10-CM | POA: Insufficient documentation

## 2015-12-05 DIAGNOSIS — Z23 Encounter for immunization: Secondary | ICD-10-CM | POA: Insufficient documentation

## 2015-12-05 DIAGNOSIS — F17210 Nicotine dependence, cigarettes, uncomplicated: Secondary | ICD-10-CM | POA: Insufficient documentation

## 2015-12-05 DIAGNOSIS — IMO0002 Reserved for concepts with insufficient information to code with codable children: Secondary | ICD-10-CM

## 2015-12-05 DIAGNOSIS — Y939 Activity, unspecified: Secondary | ICD-10-CM | POA: Insufficient documentation

## 2015-12-05 DIAGNOSIS — Y999 Unspecified external cause status: Secondary | ICD-10-CM | POA: Insufficient documentation

## 2015-12-05 DIAGNOSIS — W271XXA Contact with garden tool, initial encounter: Secondary | ICD-10-CM | POA: Insufficient documentation

## 2015-12-05 DIAGNOSIS — S62633B Displaced fracture of distal phalanx of left middle finger, initial encounter for open fracture: Secondary | ICD-10-CM | POA: Insufficient documentation

## 2015-12-05 MED ORDER — CEPHALEXIN 500 MG PO CAPS: 500.0000 mg | ORAL_CAPSULE | Freq: Four times a day (QID) | ORAL | Status: DC

## 2015-12-05 MED ORDER — LIDOCAINE HCL (PF) 1 % IJ SOLN: 30.0000 mL | Freq: Once | INTRAMUSCULAR | Status: DC

## 2015-12-05 MED ORDER — TETANUS-DIPHTH-ACELL PERTUSSIS 5-2.5-18.5 LF-MCG/0.5 IM SUSP
0.5000 mL | Freq: Once | INTRAMUSCULAR | Status: AC
  Administered 2015-12-05: 0.5 mL via INTRAMUSCULAR
  Filled 2015-12-05: qty 0.5

## 2015-12-05 NOTE — ED Notes (Signed)
Pt cut left middle finger with hedge clippers yesterday at 1700, sharp throbbing pain to site. no recent tetanus shot. Sensation intact. Skin pale. Two deep lacerations to distal finger.

## 2015-12-05 NOTE — ED Provider Notes (Signed)
CSN: [id number]     Arrival date & time 12/05/15  1054 History   First MD Initiated Contact with Patient 12/05/15 1140     Chief Complaint  Patient presents with  . Finger Injury     (Consider location/radiation/quality/duration/timing/severity/associated sxs/prior Treatment) Patient is a 50 y.o. male presenting with skin laceration.  Laceration Location:  Finger Finger laceration location:  L middle finger Depth:  Through dermis Bleeding: controlled   Time since incident:  17 hours Laceration mechanism:  Blunt object (hedge clippers) Pain details:    Quality:  Sharp and aching   Severity:  Mild   Timing:  Constant   Progression:  Unchanged Foreign body present:  No foreign bodies Relieved by:  Nothing Worsened by:  Movement Tetanus status:  Out of date  Terry Golden. is a 50 y.o. male with PMH significant for CHD, mitral valve disease who presents with Sudden onset left middle finger laceration that occurred approximately 17 hours ago. Patient reports he was using hedge clippers when he accidentally cut the end of his left middle finger. He reports he has taken 2 aspirin for pain. His tetanus is not up to date.   Past Medical History  Diagnosis Date  . Rheumatic fever   . ASD (atrial septal defect)     large   . VSD (ventricular septal defect)     small  . Mitral valve disease 03/25/1999    s/p mitral valve repair with commissuroplasty   . Syncope     1999  . Congenital heart disease     ASVD, Cleft Mitral Valve  . 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    right   Past Surgical History  Procedure Laterality Date  . Cardiac surgery  03/25/1999    s/p partial AVSD repair and cleft mitral valve repair with commissuroplasty  . Cardiac catheterization N/A 09/20/2015    Procedure: Right/Left Heart Cath and Coronary Angiography;  Surgeon: Jettie Booze, MD;  Location: [facility];  Service: Cardiovascular;  Laterality: N/A;   Family History  Problem Relation  Age of Onset  . Diabetes Sister   . Mental illness Sister   . Heart disease Sister   . Arthritis Other   . Heart disease Other   . Hypertension Other   . Alcohol abuse Other   . Arthritis Other   . Heart disease Other   . Stroke Other   . Mental illness Other   . Kidney disease Other   . Heart attack Father   . Stroke Father    Social History  Substance Use Topics  . Smoking status: Current Every Day Smoker -- 0.50 packs/day    Types: Cigarettes  . Smokeless tobacco: Never Used     Comment: 0.5 ppd; Started smoking as a teenager  . Alcohol Use: 0.0 oz/week    0 Standard drinks or equivalent per week     Comment: Occasional social    Review of Systems  Constitutional: Negative for fever.  Skin: Positive for wound.  Neurological: Negative for weakness and numbness.  All other systems reviewed and are negative.     Allergies  Tensilon  Home Medications   Prior to Admission medications   Medication Sig Start Date End Date Taking? Authorizing Provider  aspirin 325 MG tablet Take 325 mg by mouth every 4 (four) hours as needed for pain (headache).    Historical Provider, MD   BP 138/81 mmHg  Pulse 60  Temp(Src) 98 F (36.7 C) (Oral)  Resp 17  SpO2 99% Physical Exam  Constitutional: He is oriented to person, place, and time. He appears well-developed and well-nourished.  HENT:  Head: Normocephalic and atraumatic.  Eyes: Conjunctivae are normal.  Neck: Normal range of motion.  Cardiovascular:  Capillary refill less than 3 seconds distal to injury.   Pulmonary/Chest: Effort normal. No respiratory distress.  Abdominal: He exhibits no distension.  Musculoskeletal: He exhibits tenderness.  Normal ROM at left middle MCP, PIPJ, and DIPJ.   Neurological: He is alert and oriented to person, place, and time.  Strength and sensation intact distal to injury.  Skin: Skin is warm and dry.  2 0.5 cm lacerations on left distal finger pad.  Bleeding controlled.  No foreign  bodies visualized or palpated in a bloodless field.     ED Course  Procedures (including critical care time) Labs Review Labs Reviewed - No data to display  Imaging Review Dg Finger Middle Left  12/05/2015  CLINICAL DATA:  Pain after trauma yesterday. EXAM: LEFT MIDDLE FINGER 2+V COMPARISON:  None. FINDINGS: The soft tissues of the distal middle finger are irregular consistent with laceration/trauma. No foreign bodies are seen. There is a comminuted distal tuft fracture underlying the soft tissue irregularity. No other abnormalities. IMPRESSION: Comminuted mildly displaced distal tuft fracture associated with the middle finger. An overlying skin defect is identified. Electronically Signed   By: Dorise Bullion III M.D   On: 12/05/2015 11:59   I have personally reviewed and evaluated these images and lab results as part of my medical decision-making.   EKG Interpretation None      MDM   Final diagnoses:  Laceration  Open fracture of tuft of distal phalanx of finger, initial encounter   Patient presents for evaluation of left middle finger laceration. VSS, NAD. Afebrile. On exam, 2 small lacerations to left middle distal finger pad. Sensation intact. Capillary refill less than 3 seconds. Normal range of motion. Tetanus updated. Wound cleaned. Given that the laceration occurred over 12 hours ago, will not perform primary closure due to increased risk of infection.  Dressing applied and finger placed in splint. Xray shows mildly displaced distal tuft fracture. Patient will be placed on Keflex and given follow-up with hand surgery.  Discussed return precautions.  Patient agrees and acknowledges the above plan for discharge.   Case has been discussed with Dr. Maryan Rued who agrees with the above plan for discharge.       [name], PA-C 12/05/15 1342  [name], MD 12/05/15 [phone]

## 2015-12-05 NOTE — Discharge Instructions (Signed)
You have a distal tuft fracture of your left middle finger.  You cut occurred >12 hours ago, so repair with stitches is not indicated due to high risk of infection.  It is important you take Keflex 4 times daily for the next 7 days.  Take Tylenol or Ibuprofen for your pain.  Do not take aspirin.  Follow up with Dr. Lenon Curt, hand surgery in the next 2-3 days.  Call their office today to schedule an appointment.  Return to the ED if you experience worsening pain, swelling, increased redness, pus, fever or chills.  Nonsutured Laceration Care  A laceration is a cut that goes through all layers of the skin and extends into the tissue that is right under the skin. This type of cut is usually stitched up (sutured) or closed with tape (adhesive strips) or skin glue shortly after the injury happens.  However, if the wound is dirty or if several hours pass before medical treatment is provided, it is likely that germs (bacteria) will enter the wound. Closing a laceration after bacteria have entered it increases the risk of infection. In these cases, your health care provider may leave the laceration open (nonsutured) and cover it with a bandage. This type of treatment helps prevent infection and allows the wound to heal from the deepest layer of tissue damage up to the surface.  An open fracture is a type of injury that may involve nonsutured lacerations. An open fracture is a break in a bone that happens along with one or more lacerations through the skin that is near the fracture site.  HOW TO CARE FOR YOUR NONSUTURED LACERATION  Take or apply over-the-counter and prescription medicines only as told by your health care provider.  If you were prescribed an antibiotic medicine, take or apply it as told by your health care provider. Do not stop using the antibiotic even if your condition improves.  Clean the wound one time each day or as told by your health care provider.  Wash the wound with mild soap and water.    Rinse the wound with water to remove all soap.  Pat your wound dry with a clean towel. Do not rub the wound. Do not inject anything into the wound unless your health care provider told you to.  Change any bandages (dressings) as told by your health care provider. This includes changing the dressing if it gets wet, dirty, or starts to smell bad.  Keep the dressing dry until your health care provider says it can be removed. Do not take baths, swim, or do anything that puts your wound underwater until your health care provider approves.  Raise (elevate) the injured area above the level of your heart while you are sitting or lying down, if possible.  Do not scratch or pick at the wound.  Check your wound every day for signs of infection. Watch for:  Redness, swelling, or pain.  Fluid, blood, or pus. Keep all follow-up visits as told by your health care provider. This is important. SEEK MEDICAL CARE IF:  You received a tetanus and shot and you have swelling, severe pain, redness, or bleeding at the injection site.  You have a fever.  Your pain is not controlled with medicine.  You have increased redness, swelling, or pain at the site of your wound.  You have fluid, blood, or pus coming from your wound.  You notice a bad smell coming from your wound or your dressing.  You notice something coming  out of the wound, such as wood or glass.  You notice a change in the color of your skin near your wound.  You develop a new rash.  You need to change the dressing frequently due to fluid, blood, or pus draining from the wound.  You develop numbness around your wound. SEEK IMMEDIATE MEDICAL CARE IF:  Your pain suddenly increases and is severe.  You develop severe swelling around the wound.  The wound is on your hand or foot and you cannot properly move a finger or toe.  The wound is on your hand or foot and you notice that your fingers or toes look pale or bluish.  You have a red streak going away from  your wound. This information is not intended to replace advice given to you by your health care provider. Make sure you discuss any questions you have with your health care provider.  Document Released: 06/17/2006 Document Revised: 12/04/2014 Document Reviewed: 07/16/2014  Elsevier Interactive Patient Education [employer].

## 2015-12-10 ENCOUNTER — Other Ambulatory Visit (HOSPITAL_COMMUNITY): Payer: Self-pay

## 2016-03-04 ENCOUNTER — Encounter (INDEPENDENT_AMBULATORY_CARE_PROVIDER_SITE_OTHER): Payer: Self-pay

## 2016-03-04 ENCOUNTER — Ambulatory Visit (HOSPITAL_COMMUNITY): Payer: Self-pay | Attending: Interventional Cardiology

## 2016-03-04 ENCOUNTER — Other Ambulatory Visit: Payer: Self-pay

## 2016-03-04 DIAGNOSIS — I351 Nonrheumatic aortic (valve) insufficiency: Secondary | ICD-10-CM | POA: Insufficient documentation

## 2016-03-04 DIAGNOSIS — I340 Nonrheumatic mitral (valve) insufficiency: Secondary | ICD-10-CM | POA: Insufficient documentation

## 2016-03-04 DIAGNOSIS — I071 Rheumatic tricuspid insufficiency: Secondary | ICD-10-CM | POA: Insufficient documentation

## 2016-03-04 DIAGNOSIS — I517 Cardiomegaly: Secondary | ICD-10-CM | POA: Insufficient documentation

## 2016-03-04 DIAGNOSIS — Z720 Tobacco use: Secondary | ICD-10-CM | POA: Insufficient documentation

## 2016-03-04 LAB — ECHOCARDIOGRAM COMPLETE
Ao-asc: 34 cm
CHL CUP MV DEC (S): 349
CHL CUP RV SYS PRESS: 20 mmHg
EWDT: 349 ms
FS: 31 % (ref 28–44)
IV/PV OW: 0.95
LA vol A4C: 50 ml
LA vol index: 33.1 mL/m2
LADIAMINDEX: 1.93 cm/m2
LASIZE: 35 mm
LAVOL: 60 mL
LDCA: 2.84 cm2
LEFT ATRIUM END SYS DIAM: 35 mm
LV PW d: 10.6 mm — AB (ref 0.6–1.1)
LVOT VTI: 30.4 cm
LVOT peak grad rest: 12 mmHg
LVOTD: 19 mm
LVOTPV: 170 cm/s
LVOTSV: 86 mL
MV pk A vel: 110 m/s
MV pk E vel: 123 m/s
MVPG: 6 mmHg
Reg peak vel: 209 cm/s
TRMAXVEL: 209 cm/s

## 2016-04-09 ENCOUNTER — Telehealth: Payer: Self-pay | Admitting: Interventional Cardiology

## 2016-04-09 NOTE — Telephone Encounter (Signed)
New message ° ° ° ° °Pt returning nurse call. Please call.  °

## 2016-04-09 NOTE — Telephone Encounter (Signed)
**Note De-Identified Minervia Osso Obfuscation** See Echo results form 8/2.

## 2016-10-28 ENCOUNTER — Encounter: Payer: Self-pay | Admitting: Interventional Cardiology

## 2016-11-08 NOTE — Progress Notes (Addendum)
Patient ID: Terry Golden., male   DOB: 1966/02/24, 51 y.o.   MRN: [id number]     Cardiology Office Note   Date:  11/09/2016   ID:  Terry Golden., DOB January 01, 1966, MRN [id number]  PCP:  Cathlean Cower, MD    Chief Complaint  Patient presents with  . Chest Pain  f/u ASD repair, cath   Wt Readings from Last 3 Encounters:  11/09/16 140 lb (63.5 kg)  11/26/15 146 lb 12.8 oz (66.6 kg)  09/20/15 150 lb (68 kg)       History of Present Illness: Terry Golden. is a 51 y.o. 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 His last cath was 01/1999 as preop assessment which showed normal coronaries. He was admitted from 12/13-12/14/16 with worsening SOB, lightheadedness, presyncope, and chest tightness.  Due to persistent chest pain, he had a cath in 2/17.  THis showed nonobstructive CAD.    He continues to smoke.  He is trying to cut back and is down to 1 pack per week.    With stress, he can feel a cramp in his lower chest/upper abdomen.  It is not related to exertion.  Worse if he turns a certain way.    Past Medical History:  Diagnosis Date  . ASD (atrial septal defect)    large   . Congenital heart disease    ASVD, Cleft Mitral Valve  . 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 Mitral valve disease 03/25/1999   s/p mitral valve repair with commissuroplasty   . Rheumatic fever   . Syncope    1999  . VSD (ventricular septal defect)    small    Past Surgical History:  Procedure Laterality Date  . CARDIAC CATHETERIZATION N/A 09/20/2015   Procedure: Right/Left Heart Cath and Coronary Angiography;  Surgeon: Jettie Booze, MD;  Location: [facility];  Service: Cardiovascular;  Laterality: N/A;  . CARDIAC SURGERY  03/25/1999   s/p partial AVSD repair and cleft mitral valve repair with commissuroplasty     Current Outpatient Prescriptions  Medication Sig Dispense Refill  . aspirin 325  MG tablet Take 325 mg by mouth every 4 (four) hours as needed for pain (headache).    . cephALEXin (KEFLEX) 500 MG capsule Take 1 capsule (500 mg total) by mouth 4 (four) times daily. 28 capsule 0   No current facility-administered medications for this visit.     Allergies:   Tensilon [edrophonium]    Social History:  The patient  reports that he has been smoking Cigarettes.  He has been smoking about 0.50 packs per day. He has never used smokeless tobacco. He reports that he drinks alcohol. He reports that he does not use drugs.   Family History:  The patient's family history includes Alcohol abuse in his other; Arthritis in his other and other; Diabetes in his sister; Heart attack in his father; Heart disease in his other, other, and sister; Hypertension in his other; Kidney disease in his other; Mental illness in his other and sister; Stroke in his father and other.    ROS:  Please see the history of present illness.   Otherwise, review of systems are positive for chest pain.   All other systems are reviewed and negative.    PHYSICAL EXAM: VS:  BP 112/72   Pulse 60   Resp 16   Ht  5\' 9"  (1.753 m)   Wt 140 lb (63.5 kg)   BMI 20.67 kg/m  , BMI Body mass index is 20.67 kg/m. GEN: Well nourished, well developed, in no acute distress  HEENT: normal  Neck: no JVD, carotid bruits, or masses Cardiac: RRR; no murmurs, rubs, or gallops,no edema  Respiratory:  clear to auscultation bilaterally, normal work of breathing GI: soft, nontender, nondistended, + BS MS: no deformity or atrophy  Skin: warm and dry, no rash Neuro:  Strength and sensation are intact Psych: euthymic mood, full affect   Recent Labs: No results found for requested labs within last 8760 hours.   Lipid Panel    Component Value Date/Time   CHOL 151 11/26/2015 1401   TRIG 51 11/26/2015 1401   HDL 49 11/26/2015 1401   CHOLHDL 3.1 11/26/2015 1401   VLDL 10 11/26/2015 1401   LDLCALC 92 11/26/2015 1401     Other  studies Reviewed: Additional studies/ records that were reviewed today with results demonstrating: Cath results reviewed.  ECG: sinus bradycardia, LVH, TWI inferiorly- more pronounced   ASSESSMENT AND PLAN:  1. Mitral regurgitation: Mild to moderate mitral regurgitation by most recent echo, s/p repair many years ago.  Moderate mitral stenosis from 8/17 echo (1.84 cm2). No signs of CHF. Will repeat echocardiogram in August 2018. No significant change in exam.  He continues to have [facility].  I encouraged him exercise more to increase stamina as well. 2. Atypical chest pain: Persistent. I don't think is related to ischemia. Cath showed nonobstructive disease.  Likely musculoskeletal. 3. Tobacco abuse: I strongly urged him to quit smoking. He has cut back significantly but has not quit completely. He is not interested in any smoking cessation tools. He thinks he can do it on his own.  He has quit for up to 3 months at a time.  He went back to smoking for no specific reason.  4. SHOB: May be pulmonary related.  Follow mitral valve function.  Follow for mitral stenosis in particular.  Improve stamina.   Current medicines are reviewed at length with the patient today.  The patient concerns regarding his medicines were addressed.  The following changes have been made:  No change  Labs/ tests ordered today include: echo in 2018 Aug No orders of the defined types were placed in this encounter.   Recommend 150 minutes/week of aerobic exercise Low fat, low carb, high fiber diet recommended  Disposition:   FU in 1 year   Signed, [name], MD  11/09/2016 11:09 AM    [facility] [place], [place], [place]  [address] Phone: [phone]; Fax: [phone]

## 2016-11-09 ENCOUNTER — Ambulatory Visit (INDEPENDENT_AMBULATORY_CARE_PROVIDER_SITE_OTHER): Payer: Self-pay | Admitting: Interventional Cardiology

## 2016-11-09 ENCOUNTER — Encounter: Payer: Self-pay | Admitting: Interventional Cardiology

## 2016-11-09 VITALS — BP 112/72 | HR 60 | Resp 16 | Ht 69.0 in | Wt 140.0 lb

## 2016-11-09 DIAGNOSIS — F17200 Nicotine dependence, unspecified, uncomplicated: Secondary | ICD-10-CM

## 2016-11-09 DIAGNOSIS — R0602 Shortness of breath: Secondary | ICD-10-CM

## 2016-11-09 DIAGNOSIS — I059 Rheumatic mitral valve disease, unspecified: Secondary | ICD-10-CM

## 2016-11-09 NOTE — Patient Instructions (Signed)
Medication Instructions:  Your physician recommends that you continue on your current medications as directed. Please refer to the Current Medication list given to you today.   Labwork: None  Testing/Procedures: Your physician has requested that you have an echocardiogram in August, 2018. Echocardiography is a painless test that uses sound waves to create images of your heart. It provides your doctor with information about the size and shape of your heart and how well your heart's chambers and valves are working. This procedure takes approximately one hour. There are no restrictions for this procedure.  Follow-Up: Your physician wants you to follow-up in: 1 year with Dr. [name]. You will receive a reminder letter in the mail two months in advance. If you don't receive a letter, please call our office to schedule the follow-up appointment.   Any Other Special Instructions Will Be Listed Below (If Applicable).     If you need a refill on your cardiac medications before your next appointment, please call your pharmacy.

## 2016-12-27 ENCOUNTER — Emergency Department (HOSPITAL_COMMUNITY)
Admission: EM | Admit: 2016-12-27 | Discharge: 2016-12-27 | Disposition: A | Discharge status: Home / Self Care | Payer: Self-pay | Attending: Emergency Medicine | Admitting: Emergency Medicine

## 2016-12-27 ENCOUNTER — Encounter (HOSPITAL_COMMUNITY): Payer: Self-pay | Admitting: *Deleted

## 2016-12-27 ENCOUNTER — Emergency Department (HOSPITAL_COMMUNITY): Payer: Self-pay

## 2016-12-27 DIAGNOSIS — S61012A Laceration without foreign body of left thumb without damage to nail, initial encounter: Secondary | ICD-10-CM | POA: Insufficient documentation

## 2016-12-27 DIAGNOSIS — W231XXA Caught, crushed, jammed, or pinched between stationary objects, initial encounter: Secondary | ICD-10-CM | POA: Insufficient documentation

## 2016-12-27 DIAGNOSIS — Y999 Unspecified external cause status: Secondary | ICD-10-CM | POA: Insufficient documentation

## 2016-12-27 DIAGNOSIS — F17210 Nicotine dependence, cigarettes, uncomplicated: Secondary | ICD-10-CM | POA: Insufficient documentation

## 2016-12-27 DIAGNOSIS — Z79899 Other long term (current) drug therapy: Secondary | ICD-10-CM | POA: Insufficient documentation

## 2016-12-27 DIAGNOSIS — Y929 Unspecified place or not applicable: Secondary | ICD-10-CM | POA: Insufficient documentation

## 2016-12-27 DIAGNOSIS — Z955 Presence of coronary angioplasty implant and graft: Secondary | ICD-10-CM | POA: Insufficient documentation

## 2016-12-27 DIAGNOSIS — Y9389 Activity, other specified: Secondary | ICD-10-CM | POA: Insufficient documentation

## 2016-12-27 DIAGNOSIS — Z7982 Long term (current) use of aspirin: Secondary | ICD-10-CM | POA: Insufficient documentation

## 2016-12-27 MED ORDER — LIDOCAINE HCL 2 % IJ SOLN
10.0000 mL | Freq: Once | INTRAMUSCULAR | Status: AC
  Administered 2016-12-27: 200 mg
  Filled 2016-12-27: qty 20

## 2016-12-27 NOTE — ED Provider Notes (Signed)
[facility] DEPT Provider Note   CSN: [id number] Arrival date & time: 12/27/16  1140  By signing my name below, I, Mayer Masker, attest that this documentation has been prepared under the direction and in the presence of Quintella Reichert, MD. Electronically Signed: Mayer Masker, Scribe. 12/27/16. 12:40 PM.  History   Chief Complaint Chief Complaint  Patient presents with  . Finger Injury   The history is provided by the patient. No language interpreter was used.    HPI Comments: Chancellor Vanderloop. is a 51 y.o. male who presents to the Emergency Department complaining of constant, rapid-onset left thumb pain s/p a ladder fell and pinched his thumb prior to arrival. He states he had associated numbness, but this subsided. He denies other associated symptoms and is not on blood thinners. His tetanus is UTD. Pt has a PMHx of a leaky mitral valve.   Past Medical History:  Diagnosis Date  . ASD (atrial septal defect)    large   . Congenital heart disease    ASVD, Cleft Mitral Valve  . 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 Mitral valve disease 03/25/1999   s/p mitral valve repair with commissuroplasty   . Rheumatic fever   . Syncope    1999  . VSD (ventricular septal defect)    small    Patient Active Problem List   Diagnosis Date Noted  . Mitral valve disorder   . Shortness of breath 07/16/2015  . Dyspnea 07/16/2015  . Positional lightheadedness 07/16/2015  . Pain in the chest   . Weight loss 03/17/2012  . Smoker 03/17/2012  . 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  . ASD (atrial septal defect)   . VSD (ventricular septal defect)   . Mitral valve disease   . Syncope   . Congenital heart disease   . Preventative health care 03/11/2012    Past Surgical History:  Procedure Laterality Date  . CARDIAC CATHETERIZATION N/A 09/20/2015   Procedure: Right/Left Heart Cath and Coronary Angiography;  Surgeon: Jettie Booze, MD;  Location: [facility];  Service:  Cardiovascular;  Laterality: N/A;  . CARDIAC SURGERY  03/25/1999   s/p partial AVSD repair and cleft mitral valve repair with commissuroplasty       Home Medications    Prior to Admission medications   Medication Sig Start Date End Date Taking? Authorizing Provider  aspirin 325 MG tablet Take 325 mg by mouth every 4 (four) hours as needed for pain (headache).    [provider]  cephALEXin (KEFLEX) 500 MG capsule Take 1 capsule (500 mg total) by mouth 4 (four) times daily. 12/05/15   [name], PA-C    Family History Family History  Problem Relation Age of Onset  . Diabetes Sister   . Mental illness Sister   . Heart disease Sister   . Arthritis Other   . Hypertension Other   . Alcohol abuse Other   . Heart attack Father   . Stroke Father   . Heart disease Other   . Arthritis Other   . Heart disease Other   . Stroke Other   . Mental illness Other   . Kidney disease Other     Social History Social History  Substance Use Topics  . Smoking status: Current Every Day Smoker    Packs/day: 0.50    Types: Cigarettes  . Smokeless tobacco: Never Used     Comment: 0.5 ppd; Started smoking as a teenager  . Alcohol use 0.0  oz/week     Comment: Occasional social     Allergies   Tensilon [edrophonium]   Review of Systems Review of Systems  Constitutional: Negative for fever.  Skin: Positive for wound.  All other systems reviewed and are negative.    Physical Exam Updated Vital Signs BP 106/70 (BP Location: Right Arm)   Pulse 64   Temp 98.7 F (37.1 C) (Oral)   Resp 18   SpO2 99%   Physical Exam  Constitutional: He is oriented to person, place, and time. He appears well-developed and well-nourished. No distress.  HENT:  Head: Normocephalic.  Neck: Neck supple.  Cardiovascular: Normal rate and regular rhythm.   Pulmonary/Chest: Effort normal. No respiratory distress.  Musculoskeletal:  2+ radial pulses bilaterally.  There is a linear 3 cm laceration  on the lateral aspect of the left thumb. Medial aspect of the left thumb has a small 1 cm well approximated superficial laceration with no active bleeding. Flexion and extension is intact throughout the hand and thumb. Sensation to light touch intact throughout the left hand. There is no tenderness over the hand. There is isolated tenderness over the first phalanx and MCP joint  Neurological: He is alert and oriented to person, place, and time.  Skin: Skin is warm and dry.  Psychiatric: He has a normal mood and affect. His behavior is normal.  Nursing note and vitals reviewed.    ED Treatments / Results  DIAGNOSTIC STUDIES: Oxygen Saturation is 99% on RA, normal by my interpretation.    COORDINATION OF CARE: 12:40 PM Discussed treatment plan with pt at bedside and pt agreed to plan.  Labs (all labs ordered are listed, but only abnormal results are displayed) Labs Reviewed - No data to display  EKG  EKG Interpretation None       Radiology No results found.  Procedures .Marland KitchenLaceration Repair Date/Time: 12/27/2016 1:53 PM Performed by: Quintella Reichert Authorized by: Quintella Reichert   Consent:    Consent obtained:  Verbal   Consent given by:  Patient Anesthesia (see MAR for exact dosages):    Anesthesia method:  Nerve block   Block location:  Left thumb   Block needle gauge:  27 G   Block anesthetic:  Lidocaine 2% w/o epi   Block injection procedure:  Anatomic landmarks identified, introduced needle and incremental injection   Block outcome:  Anesthesia achieved Laceration details:    Location:  Hand   Hand location: Left thumb.   Wound length (cm): 3. Repair type:    Repair type:  Intermediate Exploration:    Hemostasis achieved with:  Direct pressure   Wound exploration: wound explored through full range of motion     Wound extent: no tendon damage noted     Wound extent comment:  Devitalized tissue was debridement   Contaminated: no   Treatment:    Area cleansed  with:  Shur-Clens   Amount of cleaning:  Standard Skin repair:    Repair method:  Sutures   Suture size:  4-0   Suture material:  Prolene   Suture technique:  Simple interrupted   Number of sutures:  3 Approximation:    Approximation:  Close Post-procedure details:    Dressing:  Antibiotic ointment and splint for protection   Patient tolerance of procedure:  Tolerated well, no immediate complications   (including critical care time)  Medications Ordered in ED Medications - No data to display   Initial Impression / Assessment and Plan / ED Course  I  have reviewed the triage vital signs and the nursing notes.  Pertinent labs & imaging results that were available during my care of the patient were reviewed by me and considered in my medical decision making (see chart for details).     Patient here for evaluation of left thumb injury. He has a laceration to the lateral aspect of the left thumb. No evidence of tendon involvement. No evidence of foreign body or fracture. Wound was repaired per procedure note. Counseled patient on home care for laceration. Discussed outpatient follow-up and return precautions.  Final Clinical Impressions(s) / ED Diagnoses   Final diagnoses:  None    New Prescriptions New Prescriptions   No medications on file  I personally performed the services described in this documentation, which was scribed in my presence. The recorded information has been reviewed and is accurate.    Quintella Reichert, MD 12/27/16 1356

## 2016-12-27 NOTE — ED Notes (Signed)
Pt is in stable condition upon d/c and ambulates from ED. 

## 2016-12-27 NOTE — ED Triage Notes (Signed)
Pt reports injuring left thumb while using his ladder. Has laceration to side of his thumb, bleeding controlled. Reports tetanus is current.

## 2017-03-11 ENCOUNTER — Ambulatory Visit (HOSPITAL_COMMUNITY): Payer: Self-pay | Attending: Cardiology

## 2017-03-11 ENCOUNTER — Other Ambulatory Visit: Payer: Self-pay

## 2017-03-11 DIAGNOSIS — I059 Rheumatic mitral valve disease, unspecified: Secondary | ICD-10-CM

## 2017-03-11 DIAGNOSIS — I081 Rheumatic disorders of both mitral and tricuspid valves: Secondary | ICD-10-CM | POA: Insufficient documentation

## 2017-11-11 ENCOUNTER — Encounter: Payer: Self-pay | Admitting: Interventional Cardiology

## 2017-11-16 NOTE — Progress Notes (Signed)
Cardiology Office Note   Date:  11/16/2017   ID:  Terry Golden., DOB 1965-12-06, MRN [id number]  PCP:  Terry Borg, MD    No chief complaint on file.  Prior MV repair  Wt Readings from Last 3 Encounters:  11/09/16 140 lb (63.5 kg)  11/26/15 146 lb 12.8 oz (66.6 kg)  09/20/15 150 lb (68 kg)       History of Present Illness: Terry Golden. is a 52 y.o. 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 He had a  cath was 01/1999 as preop assessment which showed normal coronaries. He was admitted from 12/13-12/14/16 with worsening SOB, lightheadedness, presyncope, and chest tightness.  Due to persistent chest pain, he had a cath in 2/17.  THis showed nonobstructive CAD.    He continues to smoke.   Denies : Chest pain.  Leg edema. Nitroglycerin use. Orthopnea. Paroxysmal nocturnal dyspnea. Shortness of breath. Syncope.   He continues to have occasional dizziness.  He was sick with a cold recently.    He reports palpitations, daily.  Worse when he walks up stairs.  No bleeding problems.      Past Medical History:  Diagnosis Date  . ASD (atrial septal defect)    large   . Congenital heart disease    ASVD, Cleft Mitral Valve  . 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 Mitral valve disease 03/25/1999   s/p mitral valve repair with commissuroplasty   . Rheumatic fever   . Syncope    1999  . VSD (ventricular septal defect)    small    Past Surgical History:  Procedure Laterality Date  . CARDIAC CATHETERIZATION N/A 09/20/2015   Procedure: Right/Left Heart Cath and Coronary Angiography;  Surgeon: Jettie Booze, MD;  Location: [facility];  Service: Cardiovascular;  Laterality: N/A;  . CARDIAC SURGERY  03/25/1999   s/p partial AVSD repair and cleft mitral valve repair with commissuroplasty     Current Outpatient Medications  Medication Sig Dispense Refill  . aspirin  325 MG tablet Take 325 mg by mouth every 4 (four) hours as needed for pain (headache).    . cephALEXin (KEFLEX) 500 MG capsule Take 1 capsule (500 mg total) by mouth 4 (four) times daily. 28 capsule 0   No current facility-administered medications for this visit.     Allergies:   Tensilon [edrophonium]    Social History:  The patient  reports that he has been smoking cigarettes.  He has been smoking about 0.50 packs per day. He has never used smokeless tobacco. He reports that he drinks alcohol. He reports that he does not use drugs.   Family History:  The patient's family history includes Alcohol abuse in his other; Arthritis in his other and other; Diabetes in his sister; Heart attack in his father; Heart disease in his other, other, and sister; Hypertension in his other; Kidney disease in his other; Mental illness in his other and sister; Stroke in his father and other.    ROS:  Please see the history of present illness.   Otherwise, review of systems are positive for palpitatons.   All other systems are reviewed and negative.    PHYSICAL EXAM: VS:  There were no vitals taken for this visit. , BMI There is no height or weight on file to calculate BMI. GEN: Well nourished,  well developed, in no acute distress  HEENT: normal  Neck: no JVD, carotid bruits, or masses Cardiac: RRR; no murmurs, rubs, or gallops,no edema  Respiratory:  clear to auscultation bilaterally, normal work of breathing GI: soft, nontender, nondistended, + BS MS: no deformity or atrophy  Skin: warm and dry, no rash Neuro:  Strength and sensation are intact Psych: euthymic mood, full affect   EKG:   The ekg ordered today demonstrates NSR, inferior T wave inversion, LVH; no change from 2018   Recent Labs: No results found for requested labs within last 8760 hours.   Lipid Panel    Component Value Date/Time   CHOL 151 11/26/2015 1401   TRIG 51 11/26/2015 1401   HDL 49 11/26/2015 1401   CHOLHDL 3.1  11/26/2015 1401   VLDL 10 11/26/2015 1401   LDLCALC 92 11/26/2015 1401     Other studies Reviewed: Additional studies/ records that were reviewed today with results demonstrating: old ECG reviewed.   ASSESSMENT AND PLAN:  1. Mitral regurgitation: Moderate by echo in 8/18.  No evidence of volume overload. 2. Atypical chest pain: Cath showed nonobstructive disease.  No exertional sx. 3. Tobacco abuse:  He needs to stop smoking. 4. SHOB: Increase exercise.  No evidence of CHF. 5. Palpitations: Plan for 24 hour Holter.  Sx occur daily.  Concern for A. fib given his prior structural heart disease.   Current medicines are reviewed at length with the patient today.  The patient concerns regarding his medicines were addressed.  The following changes have been made:  No change  Labs/ tests ordered today include:  No orders of the defined types were placed in this encounter.   Recommend 150 minutes/week of aerobic exercise Low fat, low carb, high fiber diet recommended  Disposition:   FU in 1 year   Signed, [name], MD  11/16/2017 5:00 PM    [address], [place], [place]  [address] Phone: [phone]; Fax: [phone]

## 2017-11-17 ENCOUNTER — Ambulatory Visit (INDEPENDENT_AMBULATORY_CARE_PROVIDER_SITE_OTHER): Payer: Self-pay | Admitting: Interventional Cardiology

## 2017-11-17 ENCOUNTER — Encounter: Payer: Self-pay | Admitting: Interventional Cardiology

## 2017-11-17 VITALS — BP 130/80 | HR 66 | Ht 69.0 in | Wt 144.8 lb

## 2017-11-17 DIAGNOSIS — R0602 Shortness of breath: Secondary | ICD-10-CM

## 2017-11-17 DIAGNOSIS — I059 Rheumatic mitral valve disease, unspecified: Secondary | ICD-10-CM

## 2017-11-17 DIAGNOSIS — Z720 Tobacco use: Secondary | ICD-10-CM

## 2017-11-17 DIAGNOSIS — R002 Palpitations: Secondary | ICD-10-CM

## 2017-11-17 MED ORDER — ASPIRIN EC 81 MG PO TBEC: 81.0000 mg | DELAYED_RELEASE_TABLET | Freq: Every day | ORAL | 3 refills | Status: DC

## 2017-11-17 NOTE — Patient Instructions (Signed)
Medication Instructions:  Your physician has recommended you make the following change in your medication:   DECREASE: aspirin to 81 mg daily  Labwork: None ordered  Testing/Procedures: Your physician has recommended that you wear a holter monitor. Holter monitors are medical devices that record the heart's electrical activity. Doctors most often use these monitors to diagnose arrhythmias. Arrhythmias are problems with the speed or rhythm of the heartbeat. The monitor is a small, portable device. You can wear one while you do your normal daily activities. This is usually used to diagnose what is causing palpitations/syncope (passing out).  Follow-Up: Your physician wants you to follow-up in: 1 year with Dr. [name]. You will receive a reminder letter in the mail two months in advance. If you don't receive a letter, please call our office to schedule the follow-up appointment.   Any Other Special Instructions Will Be Listed Below (If Applicable).   Holter Monitoring A Holter monitor is a small device that is used to detect abnormal heart rhythms. It clips to your clothing and is connected by wires to flat, sticky disks (electrodes) that attach to your chest. It is worn continuously for 24-48 hours. Follow these instructions at home:  Wear your Holter monitor at all times, even while exercising and sleeping, for as long as directed by your health care provider.  Make sure that the Holter monitor is safely clipped to your clothing or close to your body as recommended by your health care provider.  Do not get the monitor or wires wet.  Do not put body lotion or moisturizer on your chest.  Keep your skin clean.  Keep a diary of your daily activities, such as walking and doing chores. If you feel that your heartbeat is abnormal or that your heart is fluttering or skipping a beat: ? Record what you are doing when it happens. ? Record what time of day the symptoms occur.  Return your  Holter monitor as directed by your health care provider.  Keep all follow-up visits as directed by your health care provider. This is important. Get help right away if:  You feel lightheaded or you faint.  You have trouble breathing.  You feel pain in your chest, upper arm, or jaw.  You feel sick to your stomach and your skin is pale, cool, or damp.  You heartbeat feels unusual or abnormal. This information is not intended to replace advice given to you by your health care provider. Make sure you discuss any questions you have with your health care provider. Document Released: 04/17/2004 Document Revised: 12/26/2015 Document Reviewed: 02/26/2014 Elsevier Interactive Patient Education  [employer].    If you need a refill on your cardiac medications before your next appointment, please call your pharmacy.

## 2017-11-26 ENCOUNTER — Ambulatory Visit (INDEPENDENT_AMBULATORY_CARE_PROVIDER_SITE_OTHER): Payer: Self-pay

## 2017-11-26 DIAGNOSIS — R002 Palpitations: Secondary | ICD-10-CM

## 2017-11-26 DIAGNOSIS — I059 Rheumatic mitral valve disease, unspecified: Secondary | ICD-10-CM

## 2018-08-31 ENCOUNTER — Other Ambulatory Visit: Payer: Self-pay

## 2018-08-31 ENCOUNTER — Encounter (HOSPITAL_COMMUNITY): Payer: Self-pay | Admitting: Emergency Medicine

## 2018-08-31 ENCOUNTER — Emergency Department (HOSPITAL_COMMUNITY)
Admission: EM | Admit: 2018-08-31 | Discharge: 2018-08-31 | Disposition: A | Discharge status: Home / Self Care | Payer: Self-pay | Attending: Emergency Medicine | Admitting: Emergency Medicine

## 2018-08-31 DIAGNOSIS — D1721 Benign lipomatous neoplasm of skin and subcutaneous tissue of right arm: Secondary | ICD-10-CM | POA: Insufficient documentation

## 2018-08-31 DIAGNOSIS — R229 Localized swelling, mass and lump, unspecified: Secondary | ICD-10-CM | POA: Insufficient documentation

## 2018-08-31 NOTE — ED Notes (Signed)
Bed: [facility] Expected date:  Expected time:  Means of arrival:  Comments: 

## 2018-08-31 NOTE — Discharge Instructions (Addendum)
You were evaluated in the Emergency Department and after careful evaluation, we did not find any emergent condition requiring admission or further testing in the hospital.  Your symptoms today seem to be due to a lipoma.  Please follow-up with a primary care provider for further management.  Please return to the Emergency Department if you experience any worsening of your condition.  We encourage you to follow up with a primary care provider.  Thank you for allowing [place] to be a part of your care.

## 2018-08-31 NOTE — ED Triage Notes (Signed)
Pt states that his mother noticed fluid build up on right shoulder yesterday. Pt reports has some pain at times. Denies any falls, injuries, lifting.

## 2018-08-31 NOTE — ED Provider Notes (Signed)
[facility] Emergency Department Provider Note MRN:  [id number]  Arrival date & time: 08/31/18     Chief Complaint   fluid on shoulder   History of Present Illness   Terry Golden. is a 53 y.o. year-old male with a history of congenital heart disease presenting to the ED with chief complaint of shoulder pain.  For 1 month patient has been experiencing intermittent shoulder discomfort, occurs occasionally when he is getting out of bed in the morning or moving his shoulder after long periods of rest.  His mother noticed a swelling to the back of his shoulder yesterday, which was concerning to them prompting ED visit.  Patient denies fever, no other symptoms.  No trauma to the shoulder.  Review of Systems  A complete 10 system review of systems was obtained and all systems are negative except as noted in the HPI and PMH.   Patient's Health History    Past Medical History:  Diagnosis Date  . ASD (atrial septal defect)    large   . Congenital heart disease    ASVD, Cleft Mitral Valve  . 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 Mitral valve disease 03/25/1999   s/p mitral valve repair with commissuroplasty   . Rheumatic fever   . Syncope    1999  . VSD (ventricular septal defect)    small    Past Surgical History:  Procedure Laterality Date  . CARDIAC CATHETERIZATION N/A 09/20/2015   Procedure: Right/Left Heart Cath and Coronary Angiography;  Surgeon: Jettie Booze, MD;  Location: [facility];  Service: Cardiovascular;  Laterality: N/A;  . CARDIAC SURGERY  03/25/1999   s/p partial AVSD repair and cleft mitral valve repair with commissuroplasty    Family History  Problem Relation Age of Onset  . Diabetes Sister   . Mental illness Sister   . Heart disease Sister   . Arthritis Other   . Hypertension Other   . Alcohol abuse Other   . Heart attack Father   . Stroke Father   . Heart disease Other   . Arthritis Other   . Heart disease Other    . Stroke Other   . Mental illness Other   . Kidney disease Other     Social History   Socioeconomic History  . Marital status: Divorced    Spouse name: Not on file  . Number of children: Not on file  . Years of education: 62  . Highest education level: Not on file  Occupational History  . Not on file  Social Needs  . Financial resource strain: Not on file  . Food insecurity:    Worry: Not on file    Inability: Not on file  . Transportation needs:    Medical: Not on file    Non-medical: Not on file  Tobacco Use  . Smoking status: Current Every Day Smoker    Packs/day: 0.50    Types: Cigarettes  . Smokeless tobacco: Never Used  . Tobacco comment: 0.5 ppd; Started smoking as a teenager  Substance and Sexual Activity  . Alcohol use: Yes    Alcohol/week: 0.0 standard drinks    Comment: Occasional social  . Drug use: No  . Sexual activity: Not on file  Lifestyle  . Physical activity:    Days per week: Not on file    Minutes per session: Not on file  . Stress: Not on file  Relationships  . Social connections:  Talks on phone: Not on file    Gets together: Not on file    Attends religious service: Not on file    Active member of club or organization: Not on file    Attends meetings of clubs or organizations: Not on file    Relationship status: Not on file  . Intimate partner violence:    Fear of current or ex partner: Not on file    Emotionally abused: Not on file    Physically abused: Not on file    Forced sexual activity: Not on file  Other Topics Concern  . Not on file  Social History Narrative  . Not on file     Physical Exam  Vital Signs and Nursing Notes reviewed Vitals:   08/31/18 0935  BP: 137/84  Pulse: 67  Resp: 17  Temp: 98.1 F (36.7 C)  SpO2: 98%    CONSTITUTIONAL: Well-appearing, NAD NEURO:  Alert and oriented x 3, no focal deficits EYES:  eyes equal and reactive ENT/NECK:  no LAD, no JVD CARDIO: Regular rate, well-perfused, normal  S1 and S2 PULM:  CTAB no wheezing or rhonchi GI/GU:  normal bowel sounds, non-distended, non-tender MSK/SPINE:  No gross deformities, soft tissue swelling to the right posterior shoulder adjacent to the deltoid SKIN:  no rash, atraumatic PSYCH:  Appropriate speech and behavior  Diagnostic and Interventional Summary    Labs Reviewed - No data to display  No orders to display    Medications - No data to display   Procedures  EMERGENCY DEPARTMENT US SOFT TISSUE INTERPRETATION "Study: Limited Soft Tissue Ultrasound"  INDICATIONS: Pain and Soft tissue swelling Multiple views of the body part were obtained in real-time with a multi-frequency linear probe  PERFORMED BY: Myself IMAGES ARCHIVED?: Yes SIDE:Right  BODY PART:Upper extremity INTERPRETATION:  No abcess noted, No cellulitis noted and No joint effusion noted.  Suspect soft tissue growth such as lipoma.   Critical Care  ED Course and Medical Decision Making  I have reviewed the triage vital signs and the nursing notes.  Pertinent labs & imaging results that were available during my care of the patient were reviewed by me and considered in my medical decision making (see below for details).  Lower 1 month of intermittent shoulder discomfort with normal vital signs, no fever, completely normal range of motion of the shoulder, nothing to suggest septic joint.  Bedside ultrasound reveals no joint effusion, there is evident soft tissue asymmetry to the posterior shoulders, suspect lipoma.  Patient advised to follow-up with PCP for further care and repeat evaluation.  After the discussed management above, the patient was determined to be safe for discharge.  The patient was in agreement with this plan and all questions regarding their care were answered.  ED return precautions were discussed and the patient will return to the ED with any significant worsening of condition.  [name]. [name], [place] [email or web address]  Final Clinical Impressions(s) / ED Diagnoses     ICD-10-CM   1. Soft tissue swelling R22.9   2. Lipoma of right upper extremity D17.21     ED Discharge Orders    None         [name], MD 08/31/18 1034

## 2018-09-05 ENCOUNTER — Encounter: Payer: Self-pay | Admitting: Family Medicine

## 2018-09-05 ENCOUNTER — Ambulatory Visit (INDEPENDENT_AMBULATORY_CARE_PROVIDER_SITE_OTHER): Payer: Self-pay | Admitting: Family Medicine

## 2018-09-05 VITALS — BP 139/88 | HR 68 | Temp 98.1°F | Resp 16 | Ht 69.0 in | Wt 142.0 lb

## 2018-09-05 DIAGNOSIS — Z114 Encounter for screening for human immunodeficiency virus [HIV]: Secondary | ICD-10-CM

## 2018-09-05 DIAGNOSIS — I059 Rheumatic mitral valve disease, unspecified: Secondary | ICD-10-CM

## 2018-09-05 DIAGNOSIS — Q211 Atrial septal defect, unspecified: Secondary | ICD-10-CM

## 2018-09-05 DIAGNOSIS — D1720 Benign lipomatous neoplasm of skin and subcutaneous tissue of unspecified limb: Secondary | ICD-10-CM

## 2018-09-05 DIAGNOSIS — Q210 Ventricular septal defect: Secondary | ICD-10-CM

## 2018-09-05 NOTE — Patient Instructions (Addendum)
Tobacco Use Disorder Tobacco use disorder (TUD) occurs when a person craves, seeks, and uses tobacco, regardless of the consequences. This disorder can cause problems with mental and physical health. It can affect your ability to have healthy relationships, and it can keep you from meeting your responsibilities at work, home, or school. Tobacco may be:  Smoked as a cigarette or cigar.  Inhaled using e-cigarettes.  Smoked in a pipe or hookah.  Chewed as smokeless tobacco.  Inhaled into the nostrils as snuff. Tobacco products contain a dangerous chemical called nicotine, which is very addictive. Nicotine triggers hormones that make the body feel stimulated and works on areas of the brain that make you feel good. These effects can make it hard for people to quit nicotine. Tobacco contains many other unsafe chemicals that can damage almost every organ in the body. Smoking tobacco also puts others in danger due to fire risk and possible health problems caused by breathing in secondhand smoke. What are the signs or symptoms? Symptoms of TUD may include:  Being unable to slow down or stop your tobacco use.  Spending an abnormal amount of time getting or using tobacco.  Craving tobacco. Cravings may last for up to 6 months after quitting.  Tobacco use that: ? Interferes with your work, school, or home life. ? Interferes with your personal and social relationships. ? Makes you give up activities that you once enjoyed or found important.  Using tobacco even though you know that it is: ? Dangerous or bad for your health or someone else's health. ? Causing problems in your life.  Needing more and more of the substance to get the same effect (developing tolerance).  Experiencing unpleasant symptoms if you do not use the substance (withdrawal). Withdrawal symptoms may include: ? Depressed, anxious, or irritable mood. ? Difficulty concentrating. ? Increased appetite. ? Restlessness or trouble  sleeping.  Using the substance to avoid withdrawal. How is this diagnosed? This condition may be diagnosed based on:  Your current and past tobacco use. Your health care provider may ask questions about how your tobacco use affects your life.  A physical exam. You may be diagnosed with TUD if you have at least two symptoms within a 12-month period. How is this treated? This condition is treated by stopping tobacco use. Many people are unable to quit on their own and need help. Treatment may include:  Nicotine replacement therapy (NRT). NRT provides nicotine without the other harmful chemicals in tobacco. NRT gradually lowers the dosage of nicotine in the body and reduces withdrawal symptoms. NRT is available as: ? Over-the-counter gums, lozenges, and skin patches. ? Prescription mouth inhalers and nasal sprays.  Medicine that acts on the brain to reduce cravings and withdrawal symptoms.  A type of talk therapy that examines your triggers for tobacco use, how to avoid them, and how to cope with cravings (behavioral therapy).  Hypnosis. This may help with withdrawal symptoms.  Joining a support group for others coping with TUD. The best treatment for TUD is usually a combination of medicine, talk therapy, and support groups. Recovery can be a long process. Many people start using tobacco again after stopping (relapse). If you relapse, it does not mean that treatment will not work. Follow these instructions at home:  Lifestyle  Do not use any products that contain nicotine or tobacco, such as cigarettes and e-cigarettes.  Avoid things that trigger tobacco use as much as you can. Triggers include people and situations that usually cause you   to use tobacco.  Avoid drinks that contain caffeine, including coffee. These may worsen some withdrawal symptoms.  Find ways to manage stress. Wanting to smoke may cause stress, and stress can make you want to smoke. Relaxation techniques such as  deep breathing, meditation, and yoga may help.  Attend support groups as needed. These groups are an important part of long-term recovery for many people. General instructions  Take over-the-counter and prescription medicines only as told by your health care provider.  Check with your health care provider before taking any new prescription or over-the-counter medicines.  Decide on a friend, family member, or smoking quit-line (such as 1-800-QUIT-NOW in the [place] that you can call or text when you feel the urge to smoke or when you need help coping with cravings.  Keep all follow-up visits as told by your health care provider and therapist. This is important. Contact a health care provider if:  You are not able to take your medicines as prescribed.  Your symptoms get worse, even with treatment. Summary  Tobacco use disorder (TUD) occurs when a person craves, seeks, and uses tobacco regardless of the consequences.  This condition may be diagnosed based on your current and past tobacco use and a physical exam.  Many people are unable to quit on their own and need help. Recovery can be a long process.  The most effective treatment for TUD is usually a combination of medicine, talk therapy, and support groups. This information is not intended to replace advice given to you by your health care provider. Make sure you discuss any questions you have with your health care provider. Document Released: 03/25/2004 Document Revised: 07/07/2017 Document Reviewed: 07/07/2017 Elsevier Interactive Patient Education  2019 [employer]. Smoking and Musculoskeletal Health Smoking is bad for your health. Most people know that smoking causes lung disease, heart disease, and cancer. But people may not realize that it also affects their bones, muscles, and joints (musculoskeletal system). When you smoke, the effects on your lungs and heart result in less oxygen for your musculoskeletal system. This can lead  to poor bone and joint health. How can smoking affect my musculoskeletal health? Smoking can:  Increase your risk of having weak, thin bones (osteoporosis). Elderly smokers are at higher risk for bone fractures related to osteoporosis.  Decrease the ability of bone-forming cells to make and replace bone (in addition to reducing oxygen and blood flow).  Reduce your body's ability to absorb calcium from your diet. Less calcium means weaker bones.  Interfere with the breakdown of the male hormone estrogen. Smoking lowers estrogen, which is a hormone that helps keep bones strong. Women who smoke may have earlier menopause. Menopause is a risk factor for osteoporosis.  Weaken the tissues that attach bones to muscles (tendons). This can lead to shoulder, back, and other joint injuries.  Increase your risk of rheumatoid arthritis or make the condition worse if you already have it.  Slow down healing and increase your risk of infection and other complications if you have a bone fracture or surgery that involves your musculoskeletal system.  Make you get out of breath easily. This can keep you from getting the exercise you need to keep your bones and joints healthy.  Decrease your appetite and body mass. You may lose weight and muscle strength. This can put you at higher risk for muscle injury, joint injury, and broken bones. What actions can I take to prevent musculoskeletal problems? Quit smoking      Do  not start smoking. Quit if you already do. Even stopping later in life can improve musculoskeletal health.  Do not use any products that contain nicotine or tobacco. Do not replace cigarette smoking with e-cigarettes. The safety of e-cigarettes is not known, and some may contain harmful chemicals.  Make a plan to quit smoking and commit to it. Look for programs to help you, and ask your health care provider for recommendations and ideas.  Talk with your health care provider about using  nicotine replacement medicines to help you quit, such as gum, lozenges, patches, sprays, or pills. Make other lifestyle changes   Eat a healthy diet that includes calcium and vitamin D. These nutrients are important for bone health. ? Calcium is found in dairy foods and green leafy vegetables. ? Vitamin D is found in eggs, fish, and liver. ? Many foods also have vitamin D and calcium added to them (are fortified). ? Ask your health care provider if you would benefit from taking a supplement.  Get out in the sunshine for a short time every day. This increases production of vitamin D.  Get 30 minutes of exercise at least 5 days a week. Weight-bearing and strength exercises are best for musculoskeletal health. Ask your health care provider what type of exercise is safe for you.  Do not drink alcohol if: ? Your health care provider tells you not to drink. ? You are pregnant, may be pregnant, or are planning to become pregnant.  If you drink alcohol, limit how much you have: ? 0-1 drink a day for women. ? 0-2 drinks a day for men.  Be aware of how much alcohol is in your drink. In the [place] one drink equals one 12 oz bottle of beer (355 mL), one 5 oz glass of wine (148 mL), or one 1 oz glass of hard liquor (44 mL). Where to find more information You may find more information about smoking, musculoskeletal health, and quitting smoking from:  [place] [employer]: orthoinfo.aaos.[place], Osteoporosis and Related Arp: bones.SouthExposed.es  HelpGuide.org: helpguide.org  https://hall.com/: smokefree.gov  American Lung Association: lung.org Contact a health care provider if:  You need help to quit smoking. Summary  When you smoke, the effects on your lungs and heart result in less oxygen for your musculoskeletal system.  Even stopping smoking later in life can improve musculoskeletal health.  Do not use any  products that contain nicotine or tobacco, such as cigarettes and e-cigarettes.  If you need help quitting, ask your health care provider. This information is not intended to replace advice given to you by your health care provider. Make sure you discuss any questions you have with your health care provider. Document Released: 11/15/2017 Document Revised: 11/15/2017 Document Reviewed: 11/15/2017 Elsevier Interactive Patient Education  2019 [employer] Inc[employer]  Lipoma  A lipoma is a noncancerous (benign) tumor that is made up of fat cells. This is a very common type of soft-tissue growth. Lipomas are usually found under the skin (subcutaneous). They may occur in any tissue of the body that contains fat. Common areas for lipomas to appear include the back, shoulders, buttocks, and thighs.  Lipomas grow slowly, and they are usually painless. Most lipomas do not cause problems and do not require treatment. What are the causes? The cause of this condition is not known. What increases the risk? You are more likely to develop this condition if:  You are 66-27 years old.  You  have a family history of lipomas. What are the signs or symptoms? A lipoma usually appears as a small, round bump under the skin. In most cases, the lump will:  Feel soft or rubbery.  Not cause pain or other symptoms. However, if a lipoma is located in an area where it pushes on nerves, it can become painful or cause other symptoms. How is this diagnosed? A lipoma can usually be diagnosed with a physical exam. You may also have tests to confirm the diagnosis and to rule out other conditions. Tests may include:  Imaging tests, such as a CT scan or MRI.  Removal of a tissue sample to be looked at under a microscope (biopsy). How is this treated? Treatment for this condition depends on the size of the lipoma and whether it is causing any symptoms.  For small lipomas that are not causing problems, no treatment is needed.  If  a lipoma is bigger or it causes problems, surgery may be done to remove the lipoma. Lipomas can also be removed to improve appearance. Most often, the procedure is done after applying a medicine that numbs the area (local anesthetic). Follow these instructions at home:  Watch your lipoma for any changes.  Keep all follow-up visits as told by your health care provider. This is important. Contact a health care provider if:  Your lipoma becomes larger or hard.  Your lipoma becomes painful, red, or increasingly swollen. These could be signs of infection or a more serious condition. Get help right away if:  You develop tingling or numbness in an area near the lipoma. This could indicate that the lipoma is causing nerve damage. Summary  A lipoma is a noncancerous tumor that is made up of fat cells.  Most lipomas do not cause problems and do not require treatment.  If a lipoma is bigger or it causes problems, surgery may be done to remove the lipoma. This information is not intended to replace advice given to you by your health care provider. Make sure you discuss any questions you have with your health care provider. Document Released: 07/10/2002 Document Revised: 07/06/2017 Document Reviewed: 07/06/2017 Elsevier Interactive Patient Education  2019 [employer].

## 2018-09-05 NOTE — Progress Notes (Signed)
[facility]  New Patient Encounter Provider: Lanae Boast, [place]    JOA:[id number]  SWF:[id number]  DOB - 07-03-1966  SUBJECTIVE:   Jimmylee Ratterree, is a 53 y.o. 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     Current problems/concerns:  Patient seen in the ED on 08/31/2018 due to pain and tissue swelling of the right shoulder. U/s of right shoulder was negative for infusion. Diagnosed with lipoma. Patient states that he is not currently insured. He is currently applying for disability and has a hearing this month.  He does see cardiology for a history of Atrial septal defect. Heart surgery in 2000.  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   Allergies  Allergen Reactions  . Tensilon [Edrophonium] Other (See Comments)    Blacked out    Past Medical History:  Diagnosis Date  . ASD (atrial septal defect)    large   . Congenital heart disease    ASVD, Cleft Mitral Valve  . 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 Mitral valve disease 03/25/1999   s/p mitral valve repair with commissuroplasty   . Rheumatic fever   . Syncope    1999  . VSD (ventricular septal defect)    small   Current Outpatient Medications on File Prior to Visit  Medication Sig Dispense Refill  . aspirin EC 81 MG tablet Take 1 tablet (81 mg total) by mouth daily. 90 tablet 3   No current facility-administered medications on file prior to visit.    Family History  Problem Relation Age of Onset  . Diabetes Sister   . Mental illness Sister   . Heart disease Sister   . Arthritis Other   . Hypertension Other   . Alcohol abuse Other   . Heart attack Father   . Stroke Father   . Heart disease Other   . Arthritis Other     . Heart disease Other   . Stroke Other   . Mental illness Other   . Kidney disease Other    Social History   Socioeconomic History  . Marital status: Divorced    Spouse name: Not on file  . Number of children: Not on file  . Years of education: 63  . Highest education level: Not on file  Occupational History  . Not on file  Social Needs  . Financial resource strain: Not on file  . Food insecurity:    Worry: Not on file    Inability: Not on file  . Transportation needs:    Medical: Not on file    Non-medical: Not on file  Tobacco Use  . Smoking status: Current Every Day Smoker    Packs/day: 0.50    Types: Cigarettes  . Smokeless tobacco: Never Used  . Tobacco comment: 0.5 ppd; Started smoking as a teenager  Substance and Sexual Activity  . Alcohol use: Yes    Alcohol/week: 0.0 standard drinks    Comment: Occasional social  . Drug use: No  . Sexual activity: Not on file  Lifestyle  . Physical activity:    Days per week: Not on file    Minutes per session: Not on file  . Stress: Not on file  Relationships  . Social connections:    Talks  on phone: Not on file    Gets together: Not on file    Attends religious service: Not on file    Active member of club or organization: Not on file    Attends meetings of clubs or organizations: Not on file    Relationship status: Not on file  . Intimate partner violence:    Fear of current or ex partner: Not on file    Emotionally abused: Not on file    Physically abused: Not on file    Forced sexual activity: Not on file  Other Topics Concern  . Not on file  Social History Narrative  . Not on file    ROS   OBJECTIVE:    BP 139/88 (BP Location: Right Arm, Patient Position: Sitting, Cuff Size: Normal)   Pulse 68   Temp 98.1 F (36.7 C) (Oral)   Resp 16   Ht 5\' 9"  (1.753 m)   Wt 142 lb (64.4 kg)   SpO2 99%   BMI 20.97 kg/m   Physical Exam  Constitutional: He is oriented to person, place, and time and  well-developed, well-nourished, and in no distress. No distress.  HENT:  Head: Normocephalic and atraumatic.  Eyes: Pupils are equal, round, and reactive to light. Conjunctivae and EOM are normal.  Neck: Normal range of motion. Neck supple.  Cardiovascular: Regular rhythm and intact distal pulses. Bradycardia present. Exam reveals no gallop and no friction rub.  Murmur heard. Mild murmur noted  Pulmonary/Chest: Effort normal and breath sounds normal. No respiratory distress. He has no wheezes.  Abdominal: Soft. Bowel sounds are normal. There is no abdominal tenderness.  Musculoskeletal: Normal range of motion.        General: No tenderness or edema.  Lymphadenopathy:    He has no cervical adenopathy.  Neurological: He is alert and oriented to person, place, and time. Gait normal.  Skin: Skin is warm and dry.     Psychiatric: Mood, memory, affect and judgment normal.  Nursing note and vitals reviewed.        ASSESSMENT/PLAN:  1. Mitral valve disease Patient is being followed by a specialist for this condition. Patient advised to continue with follow up appointments. PCP will continue to monitor progress.  Pending labs. Will adjust medications accordingly.    - Lipid Panel - CBC with Differential - Comprehensive metabolic panel  2. Lipoma of shoulder Explained that he would be responsible for payment if he does not have [facility] discount or health insurance. Patient states that he is willing to pay out of pocket.  - Ambulatory referral to Dermatology  3. VSD (ventricular septal defect) Patient is being followed by a specialist for this condition. Patient advised to continue with follow up appointments. PCP will continue to monitor progress.   4. ASD (atrial septal defect) Patient is being followed by a specialist for this condition. Patient advised to continue with follow up appointments. PCP will continue to monitor progress.   5. Screening for HIV (human immunodeficiency  virus) Per health maintenance.  - HIV antibody (with reflex)   Return in about 6 months (around 03/06/2019), or if symptoms worsen or fail to improve, for mitral.  The patient was given clear instructions to go to ER or return to medical center if symptoms don't improve, worsen or new problems develop. The patient verbalized understanding. The patient was told to call to get lab results if they haven't heard anything in the next week.     Ms. Doug Sou. Nathaneil Canary, FNP-BC  [facility] [address] [place], [place] [address] [phone]

## 2018-09-06 LAB — CBC WITH DIFFERENTIAL/PLATELET
Basophils Absolute: 0 10*3/uL (ref 0.0–0.2)
Basos: 0 %
EOS (ABSOLUTE): 0.2 10*3/uL (ref 0.0–0.4)
Eos: 5 %
Hematocrit: 43.5 % (ref 37.5–51.0)
Hemoglobin: 14.5 g/dL (ref 13.0–17.7)
Immature Grans (Abs): 0 10*3/uL (ref 0.0–0.1)
Immature Granulocytes: 0 %
Lymphocytes Absolute: 1.7 10*3/uL (ref 0.7–3.1)
Lymphs: 34 %
MCH: 32.1 pg (ref 26.6–33.0)
MCHC: 33.3 g/dL (ref 31.5–35.7)
MCV: 96 fL (ref 79–97)
Monocytes Absolute: 0.3 10*3/uL (ref 0.1–0.9)
Monocytes: 5 %
Neutrophils Absolute: 2.8 10*3/uL (ref 1.4–7.0)
Neutrophils: 56 %
Platelets: 242 10*3/uL (ref 150–450)
RBC: 4.52 x10E6/uL (ref 4.14–5.80)
RDW: 12.4 % (ref 11.6–15.4)
WBC: 5 10*3/uL (ref 3.4–10.8)

## 2018-09-06 LAB — LIPID PANEL
Chol/HDL Ratio: 3.3 ratio (ref 0.0–5.0)
Cholesterol, Total: 176 mg/dL (ref 100–199)
HDL: 54 mg/dL (ref >39)
LDL Calculated: 113 mg/dL — ABNORMAL HIGH (ref 0–99)
Triglycerides: 46 mg/dL (ref 0–149)
VLDL Cholesterol Cal: 9 mg/dL (ref 5–40)

## 2018-09-06 LAB — COMPREHENSIVE METABOLIC PANEL
ALT: 9 IU/L (ref 0–44)
AST: 13 IU/L (ref 0–40)
Albumin/Globulin Ratio: 1.6 (ref 1.2–2.2)
Albumin: 4.3 g/dL (ref 3.8–4.9)
Alkaline Phosphatase: 67 IU/L (ref 39–117)
BUN/Creatinine Ratio: 10 (ref 9–20)
BUN: 11 mg/dL (ref 6–24)
Bilirubin Total: 0.5 mg/dL (ref 0.0–1.2)
CO2: 24 mmol/L (ref 20–29)
Calcium: 9.4 mg/dL (ref 8.7–10.2)
Chloride: 102 mmol/L (ref 96–106)
Creatinine, Ser: 1.06 mg/dL (ref 0.76–1.27)
GFR calc Af Amer: 92 mL/min/{1.73_m2} (ref >59)
GFR calc non Af Amer: 80 mL/min/{1.73_m2} (ref >59)
Globulin, Total: 2.7 g/dL (ref 1.5–4.5)
Glucose: 99 mg/dL (ref 65–99)
Potassium: 4.2 mmol/L (ref 3.5–5.2)
Sodium: 138 mmol/L (ref 134–144)
Total Protein: 7 g/dL (ref 6.0–8.5)

## 2018-09-06 LAB — HIV ANTIBODY (ROUTINE TESTING W REFLEX): HIV Screen 4th Generation wRfx: NONREACTIVE

## 2018-09-08 ENCOUNTER — Telehealth: Payer: Self-pay

## 2018-09-08 NOTE — Telephone Encounter (Signed)
Called, no answer and no voicemail set up. Thanks!

## 2018-09-08 NOTE — Telephone Encounter (Signed)
-----   Message from Lanae Boast, [place] sent at 09/07/2018  5:01 PM EST ----- The  labs are stable without significant clinical change.  All other results are normal or within acceptable limits. No Medication changes

## 2018-09-13 ENCOUNTER — Telehealth: Payer: Self-pay

## 2018-09-14 NOTE — Telephone Encounter (Signed)
Called and spoke with patient, advised that all labs are stable and no changes are needed at this time. Thanks!

## 2018-09-27 ENCOUNTER — Inpatient Hospital Stay (INDEPENDENT_AMBULATORY_CARE_PROVIDER_SITE_OTHER): Payer: Self-pay

## 2018-10-03 ENCOUNTER — Ambulatory Visit: Payer: Self-pay | Admitting: Family Medicine

## 2018-11-09 ENCOUNTER — Telehealth: Payer: Self-pay

## 2018-11-09 NOTE — Telephone Encounter (Signed)
Called pt to give directions for webex visit 11-14-2018. Pt verbalized understanding of what to expect and is aware he needs his  BP and P taken that morning. Instructed pt to call the office if he has any issues.      Virtual Visit Pre-Appointment Phone Call  Steps For Call:  1. Confirm consent - "In the setting of the current Covid19 crisis, you are scheduled for a (phone or video) visit with your provider on (date) at (time).  Just as we do with many in-office visits, in order for you to participate in this visit, we must obtain consent.  If you'd like, I can send this to your mychart (if signed up) or email for you to review.  Otherwise, I can obtain your verbal consent now.  All virtual visits are billed to your insurance company just like a normal visit would be.  By agreeing to a virtual visit, we'd like you to understand that the technology does not allow for your provider to perform an examination, and thus may limit your provider's ability to fully assess your condition.  Finally, though the technology is pretty good, we cannot assure that it will always work on either your or our end, and in the setting of a video visit, we may have to convert it to a phone-only visit.  In either situation, we cannot ensure that we have a secure connection.  Are you willing to proceed?"  2. Give patient instructions for WebEx download to smartphone as below if video visit  3. Advise patient to be prepared with any vital sign or heart rhythm information, their current medicines, and a piece of paper and pen handy for any instructions they may receive the day of their visit  4. Inform patient they will receive a phone call 15 minutes prior to their appointment time (may be from unknown caller ID) so they should be prepared to answer  5. Confirm that appointment type is correct in Epic appointment notes (video vs telephone)    TELEPHONE CALL NOTE  Terry Golden. has been deemed a candidate for a  follow-up tele-health visit to limit community exposure during the Covid-19 pandemic. I spoke with the patient via phone to ensure availability of phone/video source, confirm preferred email & phone number, and discuss instructions and expectations.  I reminded Terry Golden. to be prepared with any vital sign and/or heart rhythm information that could potentially be obtained via home monitoring, at the time of his visit. I reminded Terry Golden. to expect a phone call at the time of his visit if his visit.  Did the patient verbally acknowledge consent to treatment? yes  Ludger Nutting Michio Thier, [facility] 11/09/2018 11:34 AM   DOWNLOADING THE [employer]  - If Apple, go to [employer] and type in WebEx in the search bar. [place] [employer], the blue/green circle. The app is free but as with any other app downloads, their phone may require them to verify saved payment information or Apple password. The patient does NOT have to create an account.  - If Android, ask patient to go to [employer] and type in WebEx in the search bar. [place] [employer], the blue/green circle. The app is free but as with any other app downloads, their phone may require them to verify saved payment information or Android password. The patient does NOT have to create an account.   CONSENT FOR TELE-HEALTH VISIT - PLEASE  REVIEW  I hereby voluntarily request, consent and authorize [facility] and its employed or contracted physicians, physician assistants, nurse practitioners or other licensed health care professionals (the Practitioner), to provide me with telemedicine health care services (the "Services") as deemed necessary by the treating Practitioner. I acknowledge and consent to receive the Services by the Practitioner via telemedicine. I understand that the telemedicine visit will involve communicating with the Practitioner through live audiovisual communication technology and  the disclosure of certain medical information by electronic transmission. I acknowledge that I have been given the opportunity to request an in-person assessment or other available alternative prior to the telemedicine visit and am voluntarily participating in the telemedicine visit.  I understand that I have the right to withhold or withdraw my consent to the use of telemedicine in the course of my care at any time, without affecting my right to future care or treatment, and that the Practitioner or I may terminate the telemedicine visit at any time. I understand that I have the right to inspect all information obtained and/or recorded in the course of the telemedicine visit and may receive copies of available information for a reasonable fee.  I understand that some of the potential risks of receiving the Services via telemedicine include:  Marland Kitchen Delay or interruption in medical evaluation due to technological equipment failure or disruption; . Information transmitted may not be sufficient (e.g. poor resolution of images) to allow for appropriate medical decision making by the Practitioner; and/or  . In rare instances, security protocols could fail, causing a breach of personal health information.  Furthermore, I acknowledge that it is my responsibility to provide information about my medical history, conditions and care that is complete and accurate to the best of my ability. I acknowledge that Practitioner's advice, recommendations, and/or decision may be based on factors not within their control, such as incomplete or inaccurate data provided by me or distortions of diagnostic images or specimens that may result from electronic transmissions. I understand that the practice of medicine is not an exact science and that Practitioner makes no warranties or guarantees regarding treatment outcomes. I acknowledge that I will receive a copy of this consent concurrently upon execution via email to the email address I  last provided but may also request a printed copy by calling the office of [place].    I understand that my insurance will be billed for this visit.   I have read or had this consent read to me. . I understand the contents of this consent, which adequately explains the benefits and risks of the Services being provided via telemedicine.  . I have been provided ample opportunity to ask questions regarding this consent and the Services and have had my questions answered to my satisfaction. . I give my informed consent for the services to be provided through the use of telemedicine in my medical care  By participating in this telemedicine visit I agree to the above.

## 2018-11-14 ENCOUNTER — Telehealth (INDEPENDENT_AMBULATORY_CARE_PROVIDER_SITE_OTHER): Payer: Self-pay | Admitting: Interventional Cardiology

## 2018-11-14 ENCOUNTER — Encounter: Payer: Self-pay | Admitting: Interventional Cardiology

## 2018-11-14 ENCOUNTER — Other Ambulatory Visit: Payer: Self-pay

## 2018-11-14 DIAGNOSIS — Z720 Tobacco use: Secondary | ICD-10-CM

## 2018-11-14 DIAGNOSIS — Q211 Atrial septal defect, unspecified: Secondary | ICD-10-CM

## 2018-11-14 DIAGNOSIS — R0789 Other chest pain: Secondary | ICD-10-CM

## 2018-11-14 DIAGNOSIS — I340 Nonrheumatic mitral (valve) insufficiency: Secondary | ICD-10-CM

## 2018-11-14 DIAGNOSIS — R030 Elevated blood-pressure reading, without diagnosis of hypertension: Secondary | ICD-10-CM

## 2018-11-14 DIAGNOSIS — I2510 Atherosclerotic heart disease of native coronary artery without angina pectoris: Secondary | ICD-10-CM

## 2018-11-14 DIAGNOSIS — I059 Rheumatic mitral valve disease, unspecified: Secondary | ICD-10-CM

## 2018-11-14 DIAGNOSIS — R0602 Shortness of breath: Secondary | ICD-10-CM

## 2018-11-14 DIAGNOSIS — F17200 Nicotine dependence, unspecified, uncomplicated: Secondary | ICD-10-CM

## 2018-11-14 DIAGNOSIS — Q249 Congenital malformation of heart, unspecified: Secondary | ICD-10-CM

## 2018-11-14 DIAGNOSIS — R002 Palpitations: Secondary | ICD-10-CM

## 2018-11-14 NOTE — Patient Instructions (Signed)
Medication Instructions:  Your physician recommends that you continue on your current medications as directed. Please refer to the Current Medication list given to you today.  If you need a refill on your cardiac medications before your next appointment, please call your pharmacy.   Lab work: None Ordered  If you have labs (blood work) drawn today and your tests are completely normal, you will receive your results only by: Marland Kitchen MyChart Message (if you have MyChart) OR . A paper copy in the mail If you have any lab test that is abnormal or we need to change your treatment, we will call you to review the results.  Testing/Procedures: None ordered  Follow-Up: . Follow up with Dr. [name] via VIDEO Visit with DOXY.ME in 3 weeks  Any Other Special Instructions Will Be Listed Below (If Applicable).  1. Monitor your Blood Pressure at home  2. Increase walking to 5 days a week, 30 minutes each day  3. Decrease your salt intake

## 2018-11-14 NOTE — Progress Notes (Signed)
Virtual Visit via Video Note   This visit type was conducted due to national recommendations for restrictions regarding the COVID-19 Pandemic (e.g. social distancing) in an effort to limit this patient's exposure and mitigate transmission in our community.  Due to his co-morbid illnesses, this patient is at least at moderate risk for complications without adequate follow up.  This format is felt to be most appropriate for this patient at this time.  All issues noted in this document were discussed and addressed.  A limited physical exam was performed with this format.  Please refer to the patient's chart for his consent to telehealth for [facility].   Evaluation Performed:  Follow-up visit  Date:  11/14/2018   ID:  Terry Golden., DOB 04/03/1966, MRN [id number]  Patient Location: Home  Provider Location: Home  PCP:  Lanae Boast, [place]  Cardiologist:  No primary care provider on file.  Electrophysiologist:  None   Chief Complaint:  Mitral regurgitation  History of Present Illness:    Terry Golden. is a 53 y.o. male who presents via audio/video conferencing for a telehealth visit today.    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 He had a  cath was 01/1999 as preop assessment which showed normal coronaries. He was admitted from 12/13-12/14/16 with worsening SOB, lightheadedness, presyncope, and chest tightness.  Due to persistent chest pain, he had a cath in 2/17. THis showed nonobstructive CAD.   He continues to smoke.  He has cut back on his cigarettes.  He only smokes 3 cigs/day.  Holter in 2019:  Normal sinus rhythm with rare PACs and PVCs.  Short runs of atrial tachycardia, longest was 11 beats.  No sustained arrthyhmias.  Denies : Chest pain. Dizziness. Leg edema. Nitroglycerin use. Orthopnea. Paroxysmal nocturnal dyspnea. Shortness of breath. Syncope.   Rare palpitations.   Not walking much since the quarantine.    He is eating mostly at home.  Eats some meat, but not too much per his estimate.    The patient does not have symptoms concerning for COVID-19 infection (fever, chills, cough, or new shortness of breath).    Past Medical History:  Diagnosis Date  . ASD (atrial septal defect)    large   . Congenital heart disease    ASVD, Cleft Mitral Valve  . 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 Mitral valve disease 03/25/1999   s/p mitral valve repair with commissuroplasty   . Rheumatic fever   . Syncope    1999  . VSD (ventricular septal defect)    small   Past Surgical History:  Procedure Laterality Date  . CARDIAC CATHETERIZATION N/A 09/20/2015   Procedure: Right/Left Heart Cath and Coronary Angiography;  Surgeon: Jettie Booze, MD;  Location: [place] [facility];  Service: Cardiovascular;  Laterality: N/A;  . CARDIAC SURGERY  03/25/1999   s/p partial AVSD repair and cleft mitral valve repair with commissuroplasty     Current Meds  Medication Sig  . aspirin EC 81 MG tablet Take 1 tablet (81 mg total) by mouth daily.     Allergies:   Tensilon [edrophonium]   Social History   Tobacco Use  . Smoking status: Current Every Day Smoker    Packs/day: 0.50    Types: Cigarettes  . Smokeless tobacco: Never Used  . Tobacco comment: 0.5 ppd; Started smoking as a teenager  Substance Use Topics  .  Alcohol use: Yes    Alcohol/week: 0.0 standard drinks    Comment: Occasional social  . Drug use: No     Family Hx: The patient's family history includes Alcohol abuse in an other family member; Arthritis in some other family members; Diabetes in his sister; Heart attack in his father; Heart disease in his sister and other family members; Hypertension in an other family member; Kidney disease in an other family member; Mental illness in his sister and another family member; Stroke in his father and another family member.  ROS:   Please see the  history of present illness.    Occasional chest pain, unchanged All other systems reviewed and are negative.   Prior CV studies:   The following studies were reviewed today:  Rare palpitations  Labs/Other Tests and Data Reviewed:    EKG:  2019, NSR, nonspecific ST changes  Recent Labs: 09/05/2018: ALT 9; BUN 11; Creatinine, Ser 1.06; Hemoglobin 14.5; Platelets 242; Potassium 4.2; Sodium 138   Recent Lipid Panel Lab Results  Component Value Date/Time   CHOL 176 09/05/2018 10:00 AM   TRIG 46 09/05/2018 10:00 AM   HDL 54 09/05/2018 10:00 AM   CHOLHDL 3.3 09/05/2018 10:00 AM   CHOLHDL 3.1 11/26/2015 02:01 PM   LDLCALC 113 (H) 09/05/2018 10:00 AM    Wt Readings from Last 3 Encounters:  11/14/18 150 lb (68 kg)  09/05/18 142 lb (64.4 kg)  08/31/18 150 lb (68 kg)     Objective:    Vital Signs:  BP (!) 148/84   Pulse 64   Ht 5\' 9"  (1.753 m)   Wt 150 lb (68 kg)   BMI 22.15 kg/m    Well nourished, well developed male in no acute distress. No shortness of breath  ASSESSMENT & PLAN:    1. Mitral regurgitation: Moderate in 8/18. No apparent signs of heart failure.  Appears euvolemic from what I can see on the screen.  2. Atypical chest pain: Nonobstructive disease noted at last cath. LDL 113 in 09/2018. 3. Tobacco abuse: He has cut back but needs to fully stop smoking.  4. SHOB: Stable.  Not walking much. 5. Elevated BP reading: Reading high today.  Will have him check at home over the next few weeks.  May need BP meds.  Will f/u in 3 weeks to review his BPs.  WOuld considering starting BP meds if readings remain high.  Likely ACE-I or amlodipine.   COVID-19 Education: The signs and symptoms of COVID-19 were discussed with the patient and how to seek care for testing (follow up with PCP or arrange E-visit).  The importance of social distancing was discussed today.  Time:   Today, I have spent  minutes with the patient with telehealth technology discussing the above problems.      Medication Adjustments/Labs and Tests Ordered: Current medicines are reviewed at length with the patient today.  Concerns regarding medicines are outlined above.  Tests Ordered: No orders of the defined types were placed in this encounter.  Medication Changes: No orders of the defined types were placed in this encounter.   Disposition:  Follow up in 3 week(s)  Signed, [name], MD  11/14/2018 9:02 AM    [place]

## 2018-12-04 ENCOUNTER — Other Ambulatory Visit: Payer: Self-pay

## 2018-12-04 ENCOUNTER — Emergency Department (HOSPITAL_COMMUNITY): Payer: Self-pay

## 2018-12-04 ENCOUNTER — Encounter (HOSPITAL_COMMUNITY): Payer: Self-pay

## 2018-12-04 ENCOUNTER — Inpatient Hospital Stay (HOSPITAL_COMMUNITY)
Admission: EM | Admit: 2018-12-04 | Discharge: 2018-12-07 | DRG: 308 | Disposition: A | Discharge status: Home / Self Care | Payer: Self-pay | Attending: Internal Medicine | Admitting: Internal Medicine

## 2018-12-04 DIAGNOSIS — Q211 Atrial septal defect, unspecified: Secondary | ICD-10-CM

## 2018-12-04 DIAGNOSIS — Z833 Family history of diabetes mellitus: Secondary | ICD-10-CM

## 2018-12-04 DIAGNOSIS — Z888 Allergy status to other drugs, medicaments and biological substances status: Secondary | ICD-10-CM

## 2018-12-04 DIAGNOSIS — I272 Pulmonary hypertension, unspecified: Secondary | ICD-10-CM | POA: Diagnosis present

## 2018-12-04 DIAGNOSIS — I471 Supraventricular tachycardia: Secondary | ICD-10-CM | POA: Diagnosis present

## 2018-12-04 DIAGNOSIS — I2510 Atherosclerotic heart disease of native coronary artery without angina pectoris: Secondary | ICD-10-CM | POA: Diagnosis present

## 2018-12-04 DIAGNOSIS — I059 Rheumatic mitral valve disease, unspecified: Secondary | ICD-10-CM | POA: Diagnosis present

## 2018-12-04 DIAGNOSIS — Z20828 Contact with and (suspected) exposure to other viral communicable diseases: Secondary | ICD-10-CM | POA: Diagnosis present

## 2018-12-04 DIAGNOSIS — I4891 Unspecified atrial fibrillation: Principal | ICD-10-CM | POA: Diagnosis present

## 2018-12-04 DIAGNOSIS — I5032 Chronic diastolic (congestive) heart failure: Secondary | ICD-10-CM

## 2018-12-04 DIAGNOSIS — I052 Rheumatic mitral stenosis with insufficiency: Secondary | ICD-10-CM | POA: Diagnosis present

## 2018-12-04 DIAGNOSIS — Z952 Presence of prosthetic heart valve: Secondary | ICD-10-CM

## 2018-12-04 DIAGNOSIS — I071 Rheumatic tricuspid insufficiency: Secondary | ICD-10-CM | POA: Diagnosis present

## 2018-12-04 DIAGNOSIS — F17210 Nicotine dependence, cigarettes, uncomplicated: Secondary | ICD-10-CM | POA: Diagnosis present

## 2018-12-04 DIAGNOSIS — Q249 Congenital malformation of heart, unspecified: Secondary | ICD-10-CM

## 2018-12-04 DIAGNOSIS — Z7982 Long term (current) use of aspirin: Secondary | ICD-10-CM

## 2018-12-04 DIAGNOSIS — Q212 Atrioventricular septal defect: Secondary | ICD-10-CM

## 2018-12-04 DIAGNOSIS — Z818 Family history of other mental and behavioral disorders: Secondary | ICD-10-CM

## 2018-12-04 DIAGNOSIS — R079 Chest pain, unspecified: Secondary | ICD-10-CM | POA: Diagnosis present

## 2018-12-04 DIAGNOSIS — I110 Hypertensive heart disease with heart failure: Secondary | ICD-10-CM | POA: Diagnosis present

## 2018-12-04 DIAGNOSIS — Z8249 Family history of ischemic heart disease and other diseases of the circulatory system: Secondary | ICD-10-CM

## 2018-12-04 DIAGNOSIS — Z823 Family history of stroke: Secondary | ICD-10-CM

## 2018-12-04 DIAGNOSIS — R0602 Shortness of breath: Secondary | ICD-10-CM | POA: Diagnosis present

## 2018-12-04 DIAGNOSIS — I5031 Acute diastolic (congestive) heart failure: Secondary | ICD-10-CM | POA: Diagnosis present

## 2018-12-04 DIAGNOSIS — I484 Atypical atrial flutter: Secondary | ICD-10-CM

## 2018-12-04 DIAGNOSIS — Q210 Ventricular septal defect: Secondary | ICD-10-CM

## 2018-12-04 DIAGNOSIS — I509 Heart failure, unspecified: Secondary | ICD-10-CM

## 2018-12-04 LAB — CBC
HCT: 42.5 % (ref 39.0–52.0)
Hemoglobin: 13.8 g/dL (ref 13.0–17.0)
MCH: 32.9 pg (ref 26.0–34.0)
MCHC: 32.5 g/dL (ref 30.0–36.0)
MCV: 101.2 fL — ABNORMAL HIGH (ref 80.0–100.0)
Platelets: 202 10*3/uL (ref 150–400)
RBC: 4.2 MIL/uL — ABNORMAL LOW (ref 4.22–5.81)
RDW: 12.7 % (ref 11.5–15.5)
WBC: 7.5 10*3/uL (ref 4.0–10.5)
nRBC: 0 % (ref 0.0–0.2)

## 2018-12-04 LAB — LIPID PANEL
Cholesterol: 161 mg/dL (ref 0–200)
HDL: 43 mg/dL (ref >40)
LDL Cholesterol: 107 mg/dL — ABNORMAL HIGH (ref 0–99)
Total CHOL/HDL Ratio: 3.7 RATIO
Triglycerides: 53 mg/dL (ref <150)
VLDL: 11 mg/dL (ref 0–40)

## 2018-12-04 LAB — BASIC METABOLIC PANEL
Anion gap: 7 (ref 5–15)
BUN: 19 mg/dL (ref 6–20)
CO2: 22 mmol/L (ref 22–32)
Calcium: 9 mg/dL (ref 8.9–10.3)
Chloride: 112 mmol/L — ABNORMAL HIGH (ref 98–111)
Creatinine, Ser: 1.04 mg/dL (ref 0.61–1.24)
GFR calc Af Amer: >60 mL/min (ref >60)
GFR calc non Af Amer: >60 mL/min (ref >60)
Glucose, Bld: 110 mg/dL — ABNORMAL HIGH (ref 70–99)
Potassium: 3.7 mmol/L (ref 3.5–5.1)
Sodium: 141 mmol/L (ref 135–145)

## 2018-12-04 LAB — TROPONIN I
Troponin I: <0.03 ng/mL (ref <0.03)
Troponin I: <0.03 ng/mL (ref <0.03)
Troponin I: <0.03 ng/mL (ref <0.03)

## 2018-12-04 LAB — BRAIN NATRIURETIC PEPTIDE: B Natriuretic Peptide: 240.6 pg/mL — ABNORMAL HIGH (ref 0.0–100.0)

## 2018-12-04 LAB — SARS CORONAVIRUS 2 BY RT PCR (HOSPITAL ORDER, PERFORMED IN ~~LOC~~ HOSPITAL LAB): SARS Coronavirus 2: NEGATIVE

## 2018-12-04 LAB — TSH: TSH: 1.391 u[IU]/mL (ref 0.350–4.500)

## 2018-12-04 MED ORDER — SODIUM CHLORIDE 0.9% FLUSH
3.0000 mL | Freq: Once | INTRAVENOUS | Status: AC
  Administered 2018-12-04: 10:00:00 3 mL via INTRAVENOUS

## 2018-12-04 MED ORDER — SENNOSIDES-DOCUSATE SODIUM 8.6-50 MG PO TABS: 1.0000 | ORAL_TABLET | Freq: Every evening | ORAL | Status: DC | PRN

## 2018-12-04 MED ORDER — ENOXAPARIN SODIUM 80 MG/0.8ML ~~LOC~~ SOLN
70.0000 mg | Freq: Once | SUBCUTANEOUS | Status: AC
  Administered 2018-12-05: 70 mg via SUBCUTANEOUS
  Filled 2018-12-04: qty 0.8

## 2018-12-04 MED ORDER — ONDANSETRON HCL 4 MG/2ML IJ SOLN: 4.0000 mg | Freq: Four times a day (QID) | INTRAMUSCULAR | Status: DC | PRN

## 2018-12-04 MED ORDER — RIVAROXABAN 20 MG PO TABS
20.0000 mg | ORAL_TABLET | Freq: Once | ORAL | Status: AC
  Administered 2018-12-04: 20 mg via ORAL
  Filled 2018-12-04: qty 1

## 2018-12-04 MED ORDER — ACETAMINOPHEN 650 MG RE SUPP: 650.0000 mg | Freq: Four times a day (QID) | RECTAL | Status: DC | PRN

## 2018-12-04 MED ORDER — FUROSEMIDE 10 MG/ML IJ SOLN
20.0000 mg | Freq: Once | INTRAMUSCULAR | Status: AC
  Administered 2018-12-04: 20 mg via INTRAVENOUS
  Filled 2018-12-04: qty 4

## 2018-12-04 MED ORDER — SODIUM CHLORIDE 0.9 % IV SOLN
INTRAVENOUS | Status: DC
  Administered 2018-12-04 – 2018-12-07 (×6): via INTRAVENOUS

## 2018-12-04 MED ORDER — ENOXAPARIN SODIUM 40 MG/0.4ML ~~LOC~~ SOLN: 40.0000 mg | SUBCUTANEOUS | Status: DC

## 2018-12-04 MED ORDER — POTASSIUM CHLORIDE CRYS ER 20 MEQ PO TBCR
40.0000 meq | EXTENDED_RELEASE_TABLET | Freq: Once | ORAL | Status: AC
  Administered 2018-12-04: 40 meq via ORAL
  Filled 2018-12-04: qty 2

## 2018-12-04 MED ORDER — METOPROLOL TARTRATE 5 MG/5ML IV SOLN
5.0000 mg | Freq: Once | INTRAVENOUS | Status: AC
  Administered 2018-12-04: 5 mg via INTRAVENOUS
  Filled 2018-12-04: qty 5

## 2018-12-04 MED ORDER — ENOXAPARIN SODIUM 80 MG/0.8ML ~~LOC~~ SOLN
70.0000 mg | SUBCUTANEOUS | Status: AC
  Administered 2018-12-04: 70 mg via SUBCUTANEOUS
  Filled 2018-12-04: qty 0.7

## 2018-12-04 MED ORDER — AMLODIPINE BESYLATE 5 MG PO TABS
5.0000 mg | ORAL_TABLET | Freq: Every day | ORAL | Status: DC
  Administered 2018-12-04: 5 mg via ORAL
  Filled 2018-12-04: qty 1

## 2018-12-04 MED ORDER — ENOXAPARIN SODIUM 80 MG/0.8ML ~~LOC~~ SOLN: 1.0000 mg/kg | Freq: Two times a day (BID) | SUBCUTANEOUS | Status: DC

## 2018-12-04 MED ORDER — ACETAMINOPHEN 325 MG PO TABS
650.0000 mg | ORAL_TABLET | Freq: Four times a day (QID) | ORAL | Status: DC | PRN
  Administered 2018-12-04 – 2018-12-06 (×3): 650 mg via ORAL
  Filled 2018-12-04 (×4): qty 2

## 2018-12-04 MED ORDER — ONDANSETRON HCL 4 MG PO TABS: 4.0000 mg | ORAL_TABLET | Freq: Four times a day (QID) | ORAL | Status: DC | PRN

## 2018-12-04 MED ORDER — BISACODYL 5 MG PO TBEC: 5.0000 mg | DELAYED_RELEASE_TABLET | Freq: Every day | ORAL | Status: DC | PRN

## 2018-12-04 MED ORDER — HYDROCODONE-ACETAMINOPHEN 5-325 MG PO TABS
1.0000 | ORAL_TABLET | ORAL | Status: DC | PRN
  Administered 2018-12-04 – 2018-12-05 (×2): 2 via ORAL
  Administered 2018-12-06: 1 via ORAL
  Filled 2018-12-04 (×2): qty 2
  Filled 2018-12-04: qty 1

## 2018-12-04 NOTE — ED Notes (Signed)
RN attempted IV start and vein blew.  Blood obtained

## 2018-12-04 NOTE — ED Notes (Signed)
Bed: WTR6 Expected date:  Expected time:  Means of arrival:  Comments: 

## 2018-12-04 NOTE — ED Provider Notes (Addendum)
[facility] DEPT Provider Note   CSN: [id number] Arrival date & time: 12/04/18  0827    History   Chief Complaint Chief Complaint  Patient presents with   Shortness of Breath   Chest Pain    HPI Terry Golden. is a 53 y.o. male.     Patient is a 53 year old male with a history of congenital heart disease status post open heart surgery with valve replacement in 2000 with clean cath at that time.  However patient has had repeat near syncope, palpitations and shortness of breath with chest pain and had a catheterization in 2017 that showed nonobstructive heart disease and patient continues to smoke.  Patient presenting today because he was awakened at 5 AM with palpitations, shortness of breath and chest discomfort on the left side.  He states any type of activity seems to make it worse.  He states over months he has noticed more shortness of breath when he goes up the stairs and will occasionally have irregular beats and even wore a monitor for some time which showed runs of atrial tachycardia.  He takes an aspirin daily but nothing else.  However when he saw Dr. [name] via tele-visit and his PCP he was noted to have elevated blood pressure and states they were planning on starting him on something.  He does not abuse drugs or use alcohol.  His diet has been normal.  He has had no swelling or weight gain.  He has noted a mild cough this morning but denies any prior cough, congestion, fever.  He has had no nausea vomiting or diarrhea.  The history is provided by the patient.  Shortness of Breath  Severity:  Moderate Onset quality:  Sudden Duration:  4 hours Timing:  Constant Progression:  Improving Chronicity:  Recurrent Context comment:  Was sleeping and awakened by palpitations, chest discomfort like pressure Relieved by:  None tried Worsened by:  Activity Ineffective treatments:  None tried Associated symptoms: chest pain and headaches     Associated symptoms: no abdominal pain, no cough, no fever, no neck pain, no vomiting and no wheezing   Risk factors: tobacco use   Risk factors: no recent alcohol use, no hx of PE/DVT, no obesity and no recent surgery   Chest Pain  Associated symptoms: headache and shortness of breath   Associated symptoms: no abdominal pain, no cough, no fever and no vomiting     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   right   Mitral valve disease 03/25/1999   s/p mitral valve repair with commissuroplasty    Rheumatic fever    Syncope    1999   VSD (ventricular septal defect)    small    Patient Active Problem List   Diagnosis Date Noted   Mitral valve disorder    Shortness of breath 07/16/2015   Dyspnea 07/16/2015   Positional lightheadedness 07/16/2015   Pain in the chest    Weight loss 03/17/2012   Smoker 03/17/2012   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   ASD (atrial septal defect)    VSD (ventricular septal defect)    Mitral valve disease    Syncope    Congenital heart disease    Preventative health care 03/11/2012    Past Surgical History:  Procedure Laterality Date   CARDIAC CATHETERIZATION  N/A 09/20/2015   Procedure: Right/Left Heart Cath and Coronary Angiography;  Surgeon: Jettie Booze, MD;  Location: [facility];  Service: Cardiovascular;  Laterality: N/A;   CARDIAC SURGERY  03/25/1999   s/p partial AVSD repair and cleft mitral valve repair with commissuroplasty        Home Medications    Prior to Admission medications   Medication Sig Start Date End Date Taking? Authorizing Provider  aspirin EC 81 MG tablet Take 1 tablet (81 mg total) by mouth daily. 11/17/17   Jettie Booze, MD    Family History Family History  Problem Relation Age of Onset   Diabetes Sister    Mental illness Sister    Heart  disease Sister    Arthritis Other    Hypertension Other    Alcohol abuse Other    Heart attack Father    Stroke Father    Heart disease Other    Arthritis Other    Heart disease Other    Stroke Other    Mental illness Other    Kidney disease Other     Social History Social History   Tobacco Use   Smoking status: Current Every Day Smoker    Packs/day: 0.50    Types: Cigarettes   Smokeless tobacco: Never Used   Tobacco comment: 0.5 ppd; Started smoking as a teenager  Substance Use Topics   Alcohol use: Yes    Alcohol/week: 0.0 standard drinks    Comment: Occasional social   Drug use: No     Allergies   Tensilon [edrophonium]   Review of Systems Review of Systems  Constitutional: Negative for fever.  Respiratory: Positive for shortness of breath. Negative for cough and wheezing.   Cardiovascular: Positive for chest pain.  Gastrointestinal: Negative for abdominal pain and vomiting.  Musculoskeletal: Negative for neck pain.  Neurological: Positive for headaches.  All other systems reviewed and are negative.    Physical Exam Updated Vital Signs BP (!) 166/106 (BP Location: Left Arm)    Pulse (!) 101    Temp 97.8 F (36.6 C) (Oral)    Resp 20    Ht 5\' 9"  (1.753 m)    Wt 68 kg    SpO2 96%    BMI 22.15 kg/m   Physical Exam Vitals signs and nursing note reviewed.  Constitutional:      General: He is not in acute distress.    Appearance: He is well-developed.  HENT:     Head: Normocephalic and atraumatic.  Eyes:     Conjunctiva/sclera: Conjunctivae normal.     Pupils: Pupils are equal, round, and reactive to light.  Neck:     Musculoskeletal: Normal range of motion and neck supple.  Cardiovascular:     Rate and Rhythm: Tachycardia present. Rhythm irregularly irregular.     Heart sounds: No murmur.  Pulmonary:     Effort: Pulmonary effort is normal. No respiratory distress.     Breath sounds: Normal breath sounds. No wheezing or rales.      Comments: Able to speak in full sentences Abdominal:     General: There is no distension.     Palpations: Abdomen is soft.     Tenderness: There is no abdominal tenderness. There is no guarding or rebound.  Musculoskeletal: Normal range of motion.        General: No tenderness.  Skin:    General: Skin is warm and dry.     Findings: No erythema or rash.  Neurological:  General: No focal deficit present.     Mental Status: He is alert and oriented to person, place, and time. Mental status is at baseline.  Psychiatric:        Mood and Affect: Mood normal.        Behavior: Behavior normal.        Thought Content: Thought content normal.      ED Treatments / Results  Labs (all labs ordered are listed, but only abnormal results are displayed) Labs Reviewed  BASIC METABOLIC PANEL - Abnormal; Notable for the following components:      Result Value   Chloride 112 (*)    Glucose, Bld 110 (*)    All other components within normal limits  CBC - Abnormal; Notable for the following components:   RBC 4.20 (*)    MCV 101.2 (*)    All other components within normal limits  TROPONIN I  BRAIN NATRIURETIC PEPTIDE    EKG EKG Interpretation  Date/Time:  Sunday Dec 04 2018 08:43:03 EDT Ventricular Rate:  108 PR Interval:    QRS Duration: 111 QT Interval:  346 QTC Calculation: 464 R Axis:   -67 Text Interpretation:  new Atrial flutter/fibrillation Incomplete RBBB and LAFB LVH with secondary repolarization abnormality Confirmed by [name] [phone]) on 12/04/2018 8:46:42 AM   Radiology Dg Chest 2 View  Result Date: 12/04/2018 CLINICAL DATA:  Chest pain and dyspnea EXAM: CHEST - 2 VIEW COMPARISON:  07/16/2015 chest radiograph. FINDINGS: Intact sternotomy wires. Stable cardiomediastinal silhouette with borderline mild cardiomegaly. No pneumothorax. No segment pleural effusion. Mild pulmonary edema. IMPRESSION: Borderline mild cardiomegaly with mild pulmonary edema, suggesting mild  congestive heart failure. Electronically Signed   By: Ilona Sorrel M.D.   On: 12/04/2018 10:11    Procedures Procedures (including critical care time)  Medications Ordered in ED Medications  sodium chloride flush (NS) 0.9 % injection 3 mL (3 mLs Intravenous Given 12/04/18 0939)  metoprolol tartrate (LOPRESSOR) injection 5 mg (5 mg Intravenous Given 12/04/18 0939)     Initial Impression / Assessment and Plan / ED Course  I have reviewed the triage vital signs and the nursing notes.  Pertinent labs & imaging results that were available during my care of the patient were reviewed by me and considered in my medical decision making (see chart for details).       Patient presenting today with chest discomfort, palpitations and shortness of breath.  Patient is noted to be in atrial fibrillation today with heart rates between 90-1 15 at rest.  Seems that patient has been having issues with exertional dyspnea for months now and has had intermittent palpitations but based on his story nothing that has lasted this long.  Concern today that patient's atrial fibrillation is what is causing his chest discomfort and shortness of breath.  Last catheterization was in 2017 and at that time he had nonobstructive disease but he does continue to smoke.  Patient EKG today other than showing atrial fibrillation does not show new ST changes.  Patient has no infectious symptoms and low suspicion for COVID.  Chest x-ray, CBC, BMP and troponin are pending.  Patient given a dose of Lopressor for heart rate control.  Patient is hypertensive today and will continue to follow with repeat checks.  Because patient has had intermittent palpitations unclear if he would be a candidate for cardioversion today as he only takes an aspirin and is not anticoagulated.  10:24 AM Patient's labs today show normal creatinine and electrolytes, CBC  without acute findings, troponin is normal, chest x-ray with borderline mild cardiomegaly with  mild pulmonary edema suggesting mild congestive heart failure.  BNP pending.  Patient's heart rate is improved after IV Lopressor now in the 70s and 80s.  Patient states his symptoms have also improved but he is still in atrial fibrillation. And still having mild SOB and chest discomfort. Will discuss with cardiology.  10:33 AM Spoke with Dr. Harrington Challenger with cardiology and because patient is still having some mild shortness of breath with mild CHF on x-ray and some mild chest tightness will admit to start on anticoagulation, ensure patient is rate controlled and also may need to address his blood pressure.  They will see him later today or tomorrow.  CHA2DS2/VAS Stroke Risk Points      N/A >= 2 Points: High Risk  1 - 1.99 Points: Medium Risk  0 Points: Low Risk    A final score could not be computed because of missing components.:   Last Change: N/A     This score determines the patient's risk of having a stroke if the  patient has atrial fibrillation.      This score is not applicable to this patient. Components are not  calculated.     Final Clinical Impressions(s) / ED Diagnoses   Final diagnoses:  New onset a-fib ([place])  Acute congestive heart failure, unspecified heart failure type [facility])    ED Discharge Orders    None       [name], MD 12/04/18 1045    [name], MD 12/04/18 1045

## 2018-12-04 NOTE — H&P (Signed)
History and Physical    Terry Golden. EXH:[id number] DOB: April 24, 1966 DOA: 12/04/2018  PCP: Bernerd Limbo, MD Patient coming from: Home  Chief Complaint: Shortness of breath and chest pain  HPI: Terry Relph. is a 53 y.o. male with medical history significant of congenital heart disease status post valvular replacement surgery back in 2000, tobacco use came to the hospital with complaints of lightheadedness, palpitation, shortness of breath and atypical chest pain.  Patient states he woke up this morning with feeling palpitations, shortness of breath and slight left-sided chest discomfort.  He noticed his heartbeat was getting very irregular.  Previously he has been seen by Dr. [name] from cardiology.  He is also had elevated blood pressure and it was deferred to add blood pressure medications later on but otherwise only takes aspirin.  Continues to smoke tobacco daily. States he thinks he may have had previous irregular heart rate but never been severe enough to come to the ER. In the ER patient was noted to be in atrial fibrillation with RVR with some chest discomfort.  Case was discussed by the ER provider with a cardiologist who recommended admitting the patient.   Review of Systems: As per HPI otherwise 10 point review of systems negative.  Review of Systems Otherwise negative except as per HPI, including: General: Denies fever, chills, night sweats or unintended weight loss. Resp: Denies cough, wheezing, shortness of breath. Cardiac: Denies  orthopnea, paroxysmal nocturnal dyspnea. GI: Denies abdominal pain, nausea, vomiting, diarrhea or constipation GU: Denies dysuria, frequency, hesitancy or incontinence MS: Denies muscle aches, joint pain or swelling Neuro: Denies headache, neurologic deficits (focal weakness, numbness, tingling), abnormal gait Psych: Denies anxiety, depression, SI/HI/AVH Skin: Denies new rashes or lesions ID: Denies sick contacts, exotic exposures,  travel  Past Medical History:  Diagnosis Date  . ASD (atrial septal defect)    large   . Congenital heart disease    ASVD, Cleft Mitral Valve  . 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 Mitral valve disease 03/25/1999   s/p mitral valve repair with commissuroplasty   . Rheumatic fever   . Syncope    1999  . VSD (ventricular septal defect)    small    Past Surgical History:  Procedure Laterality Date  . CARDIAC CATHETERIZATION N/A 09/20/2015   Procedure: Right/Left Heart Cath and Coronary Angiography;  Surgeon: Jettie Booze, MD;  Location: [facility];  Service: Cardiovascular;  Laterality: N/A;  . CARDIAC SURGERY  03/25/1999   s/p partial AVSD repair and cleft mitral valve repair with commissuroplasty    SOCIAL HISTORY:  reports that he has been smoking cigarettes. He has been smoking about 0.50 packs per day. He has never used smokeless tobacco. He reports current alcohol use. He reports that he does not use drugs.  Allergies  Allergen Reactions  . Tensilon [Edrophonium] Other (See Comments)    Blacked out     FAMILY HISTORY: Family History  Problem Relation Age of Onset  . Diabetes Sister   . Mental illness Sister   . Heart disease Sister   . Arthritis Other   . Hypertension Other   . Alcohol abuse Other   . Heart attack Father   . Stroke Father   . Heart disease Other   . Arthritis Other   . Heart disease Other   . Stroke Other   . Mental illness Other   . Kidney disease Other      Prior to Admission  medications   Medication Sig Start Date End Date Taking? Authorizing Provider  aspirin EC 81 MG tablet Take 1 tablet (81 mg total) by mouth daily. 11/17/17   Jettie Booze, MD    Physical Exam: Vitals:   12/04/18 0915 12/04/18 0930 12/04/18 1121 12/04/18 1240  BP:  (!) 145/107 (!) 141/99 (!) 156/108  Pulse: (!) 132 99 97 79  Resp: 18 16 20 12   Temp:      TempSrc:      SpO2: 96% 94% 94% 95%  Weight:      Height:           Constitutional: NAD, calm, comfortable Eyes: PERRL, lids and conjunctivae normal ENMT: Mucous membranes are moist. Posterior pharynx clear of any exudate or lesions.Normal dentition.  Neck: normal, supple, no masses, no thyromegaly Respiratory: clear to auscultation bilaterally, no wheezing, no crackles. Normal respiratory effort. No accessory muscle use.  Cardiovascular: Irregularly irregular rate and rhythm, no murmurs / rubs / gallops. No extremity edema. 2+ pedal pulses. No carotid bruits.  Abdomen: no tenderness, no masses palpated. No hepatosplenomegaly. Bowel sounds positive.  Musculoskeletal: no clubbing / cyanosis. No joint deformity upper and lower extremities. Good ROM, no contractures. Normal muscle tone.  Skin: no rashes, lesions, ulcers. No induration Neurologic: CN 2-12 grossly intact. Sensation intact, DTR normal. Strength 5/5 in all 4.  Psychiatric: Normal judgment and insight. Alert and oriented x 3. Normal mood.     Labs on Admission: I have personally reviewed following labs and imaging studies  CBC: Recent Labs  Lab 12/04/18 0859  WBC 7.5  HGB 13.8  HCT 42.5  MCV 101.2*  PLT 009   Basic Metabolic Panel: Recent Labs  Lab 12/04/18 0859  NA 141  K 3.7  CL 112*  CO2 22  GLUCOSE 110*  BUN 19  CREATININE 1.04  CALCIUM 9.0   GFR: Estimated Creatinine Clearance: 79 mL/min (by C-G formula based on SCr of 1.04 mg/dL). Liver Function Tests: No results for input(s): AST, ALT, ALKPHOS, BILITOT, PROT, ALBUMIN in the last 168 hours. No results for input(s): LIPASE, AMYLASE in the last 168 hours. No results for input(s): AMMONIA in the last 168 hours. Coagulation Profile: No results for input(s): INR, PROTIME in the last 168 hours. Cardiac Enzymes: Recent Labs  Lab 12/04/18 0859  TROPONINI <0.03   BNP (last 3 results) No results for input(s): PROBNP in the last 8760 hours. HbA1C: No results for input(s): HGBA1C in the last 72 hours. CBG: No results for  input(s): GLUCAP in the last 168 hours. Lipid Profile: No results for input(s): CHOL, HDL, LDLCALC, TRIG, CHOLHDL, LDLDIRECT in the last 72 hours. Thyroid Function Tests: No results for input(s): TSH, T4TOTAL, FREET4, T3FREE, THYROIDAB in the last 72 hours. Anemia Panel: No results for input(s): VITAMINB12, FOLATE, FERRITIN, TIBC, IRON, RETICCTPCT in the last 72 hours. Urine analysis:    Component Value Date/Time   COLORURINE LT. YELLOW 05/16/2012 0908   APPEARANCEUR CLEAR 05/16/2012 0908   LABSPEC 1.010 05/16/2012 0908   PHURINE 6.0 05/16/2012 0908   GLUCOSEU NEGATIVE 05/16/2012 0908   HGBUR NEGATIVE 05/16/2012 0908   BILIRUBINUR NEGATIVE 05/16/2012 0908   KETONESUR NEGATIVE 05/16/2012 0908   PROTEINUR NEGATIVE 01/26/2010 0141   UROBILINOGEN 0.2 05/16/2012 0908   NITRITE NEGATIVE 05/16/2012 0908   LEUKOCYTESUR SMALL 05/16/2012 0908   Sepsis Labs: !!!!!!!!!!!!!!!!!!!!!!!!!!!!!!!!!!!!!!!!!!!! @LABRCNTIP (procalcitonin:4,lacticidven:4) )No results found for this or any previous visit (from the past 240 hour(s)).   Radiological Exams on Admission: Dg Chest 2 View  Result Date: 12/04/2018 CLINICAL DATA:  Chest pain and dyspnea EXAM: CHEST - 2 VIEW COMPARISON:  07/16/2015 chest radiograph. FINDINGS: Intact sternotomy wires. Stable cardiomediastinal silhouette with borderline mild cardiomegaly. No pneumothorax. No segment pleural effusion. Mild pulmonary edema. IMPRESSION: Borderline mild cardiomegaly with mild pulmonary edema, suggesting mild congestive heart failure. Electronically Signed   By: Ilona Sorrel M.D.   On: 12/04/2018 10:11     All images have been reviewed by me personally.  EKG: Independently reviewed.  Atrial fibrillation with RVR  Assessment/Plan Principal Problem:   Atrial fibrillation with RVR (HCC) Active Problems:   ASD (atrial septal defect)   VSD (ventricular septal defect)   Mitral valve disease   Congenital heart disease   Shortness of breath   Pain in  the chest    Atypical chest pain with new onset atrial fibrillation with RVR -Admit the patient for observation.  Currently rate appears to be better. -Trend cardiac enzymes.  Check TSH.  Started Lovenox 1 mg/kg every 12 hours - Cardiology team consulted; will keep him n.p.o. past midnight just in case.  Low-salt diet for now - Check lipid panel, hemoglobin A1c - Supportive care. 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  Currently I will start him on Norvasc 5 mg daily.  Likely will also add AV nodal blocker if necessary.  Will defer final decision to cardiology depending on what his rate does.  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 [name] outpatient.  Currently continue to monitor.  Active tobacco use -Counseled to quit smoking.   DVT prophylaxis: Lovenox 1 mg/kg every 12 hours Code Status: Full code Family Communication: None at bedside Disposition Plan: To be determined Consults called: Cardiology Admission status: Observation telemetry   Time Spent: 65 minutes.  >50% of the time was devoted to discussing the patients care, assessment, plan and disposition with other care givers along with counseling the patient about the risks and benefits of treatment.     Arsenio Loader MD [facility]  If 7PM-7AM, please contact night-coverage [email or web address]  12/04/2018, 12:50 PM

## 2018-12-04 NOTE — ED Triage Notes (Signed)
Patient c/o SOB and left chest pain since 0500 today. Patient reports a history of atrial fib and open heart surgery.

## 2018-12-05 ENCOUNTER — Observation Stay (HOSPITAL_BASED_OUTPATIENT_CLINIC_OR_DEPARTMENT_OTHER): Payer: Self-pay

## 2018-12-05 DIAGNOSIS — I361 Nonrheumatic tricuspid (valve) insufficiency: Secondary | ICD-10-CM

## 2018-12-05 DIAGNOSIS — I340 Nonrheumatic mitral (valve) insufficiency: Secondary | ICD-10-CM

## 2018-12-05 DIAGNOSIS — I5032 Chronic diastolic (congestive) heart failure: Secondary | ICD-10-CM

## 2018-12-05 DIAGNOSIS — Q249 Congenital malformation of heart, unspecified: Secondary | ICD-10-CM

## 2018-12-05 DIAGNOSIS — I5031 Acute diastolic (congestive) heart failure: Secondary | ICD-10-CM

## 2018-12-05 DIAGNOSIS — Q210 Ventricular septal defect: Secondary | ICD-10-CM

## 2018-12-05 DIAGNOSIS — I059 Rheumatic mitral valve disease, unspecified: Secondary | ICD-10-CM

## 2018-12-05 DIAGNOSIS — I484 Atypical atrial flutter: Secondary | ICD-10-CM

## 2018-12-05 DIAGNOSIS — Q211 Atrial septal defect: Secondary | ICD-10-CM

## 2018-12-05 DIAGNOSIS — R072 Precordial pain: Secondary | ICD-10-CM

## 2018-12-05 LAB — COMPREHENSIVE METABOLIC PANEL WITH GFR
ALT: 21 U/L (ref 0–44)
AST: 18 U/L (ref 15–41)
Albumin: 3.6 g/dL (ref 3.5–5.0)
Alkaline Phosphatase: 56 U/L (ref 38–126)
Anion gap: 5 (ref 5–15)
BUN: 17 mg/dL (ref 6–20)
CO2: 21 mmol/L — ABNORMAL LOW (ref 22–32)
Calcium: 8.2 mg/dL — ABNORMAL LOW (ref 8.9–10.3)
Chloride: 111 mmol/L (ref 98–111)
Creatinine, Ser: 1.07 mg/dL (ref 0.61–1.24)
GFR calc Af Amer: >60 mL/min
GFR calc non Af Amer: >60 mL/min
Glucose, Bld: 100 mg/dL — ABNORMAL HIGH (ref 70–99)
Potassium: 3.9 mmol/L (ref 3.5–5.1)
Sodium: 137 mmol/L (ref 135–145)
Total Bilirubin: 0.8 mg/dL (ref 0.3–1.2)
Total Protein: 6.6 g/dL (ref 6.5–8.1)

## 2018-12-05 LAB — CBC
HCT: 41.4 % (ref 39.0–52.0)
Hemoglobin: 13.3 g/dL (ref 13.0–17.0)
MCH: 32.1 pg (ref 26.0–34.0)
MCHC: 32.1 g/dL (ref 30.0–36.0)
MCV: 100 fL (ref 80.0–100.0)
Platelets: 208 K/uL (ref 150–400)
RBC: 4.14 MIL/uL — ABNORMAL LOW (ref 4.22–5.81)
RDW: 12.6 % (ref 11.5–15.5)
WBC: 6.3 K/uL (ref 4.0–10.5)
nRBC: 0 % (ref 0.0–0.2)

## 2018-12-05 LAB — HEMOGLOBIN A1C
Hgb A1c MFr Bld: 5.1 % (ref 4.8–5.6)
Mean Plasma Glucose: 99.67 mg/dL

## 2018-12-05 LAB — PROTIME-INR
INR: 1.3 — ABNORMAL HIGH (ref 0.8–1.2)
Prothrombin Time: 16 seconds — ABNORMAL HIGH (ref 11.4–15.2)

## 2018-12-05 LAB — ECHOCARDIOGRAM COMPLETE
Height: 69 in
Weight: 2324.53 oz

## 2018-12-05 LAB — GLUCOSE, CAPILLARY: Glucose-Capillary: 84 mg/dL (ref 70–99)

## 2018-12-05 LAB — APTT: aPTT: 48 seconds — ABNORMAL HIGH (ref 24–36)

## 2018-12-05 MED ORDER — DILTIAZEM LOAD VIA INFUSION
10.0000 mg | Freq: Once | INTRAVENOUS | Status: AC
  Administered 2018-12-05: 10 mg via INTRAVENOUS
  Filled 2018-12-05: qty 10

## 2018-12-05 MED ORDER — DILTIAZEM HCL 60 MG PO TABS: 60.0000 mg | ORAL_TABLET | Freq: Three times a day (TID) | ORAL | Status: DC

## 2018-12-05 MED ORDER — DILTIAZEM HCL 100 MG IV SOLR
5.0000 mg/h | INTRAVENOUS | Status: DC
  Administered 2018-12-05: 5 mg/h via INTRAVENOUS
  Administered 2018-12-05 – 2018-12-07 (×7): 15 mg/h via INTRAVENOUS
  Filled 2018-12-05 (×12): qty 100

## 2018-12-05 MED ORDER — APIXABAN 5 MG PO TABS
5.0000 mg | ORAL_TABLET | Freq: Two times a day (BID) | ORAL | Status: DC
  Administered 2018-12-05 – 2018-12-07 (×5): 5 mg via ORAL
  Filled 2018-12-05 (×6): qty 1

## 2018-12-05 NOTE — Progress Notes (Signed)
PROGRESS NOTE    Terry Golden.  HCW:[id number] DOB: 12-12-1965 DOA: 12/04/2018 PCP: Bernerd Limbo, MD (Confirm with patient/family/NH records and if not entered, this HAS to be entered at [facility] point of entry. "No PCP" if truly none.)   Brief Narrative:  Terry Golden. is a 53 y.o. male with medical history significant of congenital heart disease status post valvular replacement surgery back in 2000, tobacco use came to the ED on 12/04/2018 with complaints of lightheadedness, palpitation, shortness of breath and atypical chest pain that woke him up early in the morning. He noticed his heartbeat was getting very irregular.  Previously he has been seen by Dr. [name] from cardiology. Continues to smoke tobacco daily.  In the ER patient was noted to be in atrial fibrillation with RVR with some chest discomfort.  Patient was admitted under hospitalist service and cardiology was consulted.  Since patient's heart rate was controlled so no rate control medications were added.  He was started on weight-based Lovenox.  He was started on amlodipine for blood pressure control.  Now cardiology has started him on Cardizem drip and switched his anticoagulation to oral Eliquis.  Consultants:   Cardiology  Procedures:   None  Antimicrobials:   None   Subjective: Patient seen and examined.  He denies any chest pain or any shortness of breath.  Denies any palpitation.  No other complaint.  Objective: Vitals:   12/05/18 0613 12/05/18 1153 12/05/18 1216 12/05/18 1300  BP:  (!) 140/107 (!) 144/104 (!) 155/111  Pulse:  (!) 116 78 93  Resp:      Temp:      TempSrc:      SpO2:      Weight: 65.9 kg     Height:        Intake/Output Summary (Last 24 hours) at 12/05/2018 1527 Last data filed at 12/05/2018 1500 Gross per 24 hour  Intake 2329.2 ml  Output -  Net 2329.2 ml   Filed Weights   12/04/18 0843 12/05/18 0613  Weight: 68 kg 65.9 kg    Examination:  General exam: Appears calm and  comfortable  Respiratory system: Clear to auscultation. Respiratory effort normal. Cardiovascular system: S1 & S2 heard, irregularly irregular rate and rhythm. No JVD, murmurs, rubs, gallops or clicks. No pedal edema. Gastrointestinal system: Abdomen is nondistended, soft and nontender. No organomegaly or masses felt. Normal bowel sounds heard. Central nervous system: Alert and oriented. No focal neurological deficits. Extremities: Symmetric 5 x 5 power. Skin: No rashes, lesions or ulcers Psychiatry: Judgement and insight appear normal. Mood & affect appropriate.    Data Reviewed: I have personally reviewed following labs and imaging studies  CBC: Recent Labs  Lab 12/04/18 0859 12/05/18 0515  WBC 7.5 6.3  HGB 13.8 13.3  HCT 42.5 41.4  MCV 101.2* 100.0  PLT 202 176   Basic Metabolic Panel: Recent Labs  Lab 12/04/18 0859 12/05/18 0515  NA 141 137  K 3.7 3.9  CL 112* 111  CO2 22 21*  GLUCOSE 110* 100*  BUN 19 17  CREATININE 1.04 1.07  CALCIUM 9.0 8.2*   GFR: Estimated Creatinine Clearance: 74.4 mL/min (by C-G formula based on SCr of 1.07 mg/dL). Liver Function Tests: Recent Labs  Lab 12/05/18 0515  AST 18  ALT 21  ALKPHOS 56  BILITOT 0.8  PROT 6.6  ALBUMIN 3.6   No results for input(s): LIPASE, AMYLASE in the last 168 hours. No results for input(s): AMMONIA in the last  168 hours. Coagulation Profile: Recent Labs  Lab 12/05/18 0515  INR 1.3*   Cardiac Enzymes: Recent Labs  Lab 12/04/18 0859 12/04/18 1517 12/04/18 2119  TROPONINI <0.03 <0.03 <0.03   BNP (last 3 results) No results for input(s): PROBNP in the last 8760 hours. HbA1C: Recent Labs    12/04/18 2119  HGBA1C 5.1   CBG: Recent Labs  Lab 12/05/18 0744  GLUCAP 84   Lipid Profile: Recent Labs    12/04/18 0859  CHOL 161  HDL 43  LDLCALC 107*  TRIG 53  CHOLHDL 3.7   Thyroid Function Tests: Recent Labs    12/04/18 0859  TSH 1.391   Anemia Panel: No results for input(s):  VITAMINB12, FOLATE, FERRITIN, TIBC, IRON, RETICCTPCT in the last 72 hours. Sepsis Labs: No results for input(s): PROCALCITON, LATICACIDVEN in the last 168 hours.  Recent Results (from the past 240 hour(s))  SARS Coronavirus 2 [facility] order, Performed in [place] hospital lab)     Status: None   Collection Time: 12/04/18 12:09 PM  Result Value Ref Range Status   SARS Coronavirus 2 NEGATIVE NEGATIVE Final    Comment: (NOTE) If result is NEGATIVE SARS-CoV-2 target nucleic acids are NOT DETECTED. The SARS-CoV-2 RNA is generally detectable in upper and lower  respiratory specimens during the acute phase of infection. The lowest  concentration of SARS-CoV-2 viral copies this assay can detect is 250  copies / mL. A negative result does not preclude SARS-CoV-2 infection  and should not be used as the sole basis for treatment or other  patient management decisions.  A negative result may occur with  improper specimen collection / handling, submission of specimen other  than nasopharyngeal swab, presence of viral mutation(s) within the  areas targeted by this assay, and inadequate number of viral copies  (<250 copies / mL). A negative result must be combined with clinical  observations, patient history, and epidemiological information. If result is POSITIVE SARS-CoV-2 target nucleic acids are DETECTED. The SARS-CoV-2 RNA is generally detectable in upper and lower  respiratory specimens dur ing the acute phase of infection.  Positive  results are indicative of active infection with SARS-CoV-2.  Clinical  correlation with patient history and other diagnostic information is  necessary to determine patient infection status.  Positive results do  not rule out bacterial infection or co-infection with other viruses. If result is PRESUMPTIVE POSTIVE SARS-CoV-2 nucleic acids MAY BE PRESENT.   A presumptive positive result was obtained on the submitted specimen  and confirmed on repeat testing.   While 2019 novel coronavirus  (SARS-CoV-2) nucleic acids may be present in the submitted sample  additional confirmatory testing may be necessary for epidemiological  and / or clinical management purposes  to differentiate between  SARS-CoV-2 and other Sarbecovirus currently known to infect humans.  If clinically indicated additional testing with an alternate test  methodology [phone]) is advised. The SARS-CoV-2 RNA is generally  detectable in upper and lower respiratory sp ecimens during the acute  phase of infection. The expected result is Negative. Fact Sheet for Patients:  StrictlyIdeas.no Fact Sheet for Healthcare Providers: BankingDealers.co.za This test is not yet approved or cleared by the [place] FDA and has been authorized for detection and/or diagnosis of SARS-CoV-2 by FDA under an Emergency Use Authorization (EUA).  This EUA will remain in effect (meaning this test can be used) for the duration of the COVID-19 declaration under Section 564(b)(1) of the Act, 21 U.S.C. section 360bbb-3(b)(1), unless the authorization is terminated  or revoked sooner. Performed at [facility], [place] [address], [place], [place] [address]       Radiology Studies: Dg Chest 2 View  Result Date: 12/04/2018 CLINICAL DATA:  Chest pain and dyspnea EXAM: CHEST - 2 VIEW COMPARISON:  07/16/2015 chest radiograph. FINDINGS: Intact sternotomy wires. Stable cardiomediastinal silhouette with borderline mild cardiomegaly. No pneumothorax. No segment pleural effusion. Mild pulmonary edema. IMPRESSION: Borderline mild cardiomegaly with mild pulmonary edema, suggesting mild congestive heart failure. Electronically Signed   By: Ilona Sorrel M.D.   On: 12/04/2018 10:11    Scheduled Meds: . apixaban  5 mg Oral BID   Continuous Infusions: . sodium chloride 75 mL/hr at 12/05/18 0437  . diltiazem (CARDIZEM) infusion 10 mg/hr (12/05/18 1355)      LOS: 0 days   Assessment & Plan:   Principal Problem:   Atrial fibrillation with RVR ([place]) Active Problems:   ASD (atrial septal defect)   VSD (ventricular septal defect)   Mitral valve disease   Congenital heart disease   Shortness of breath   Pain in the chest   Atypical atrial flutter (HCC)   Acute diastolic CHF (congestive heart failure), NYHA class 3 (HCC)   Atypical chest pain: Ruled out of MI with negative cardiac enzymes.  Echo pending.  TSH negative.  Has history of nonobstructive CAD on left heart catheterization in 2017.  new onset atrial fibrillation with RVR: Rates fairly controlled however now he has been started on Cardizem drip and Eliquis.  Cardiology on board.  Management deferred to them.  Hypertension: Hopefully Cardizem will control his blood pressure.  It was elevated earlier but now much better.  If needed we will add other medications.  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 [name] outpatient.  Currently continue to monitor.  Active tobacco use -Counseled to quit smoking.  DVT prophylaxis: Eliquis Code Status: Full code Family Communication: Discussed with patient Disposition Plan: To be determined   Time spent: 30 min    Darliss Cheney, MD [facility] Pager [phone]  If 7PM-7AM, please contact night-coverage [email or web address] Password [email or web address] 12/05/2018, 3:27 PM

## 2018-12-05 NOTE — Progress Notes (Signed)
Patient BP 155/111.  Spoke with Dr. Meda Coffee; received orders to titrate cardizem up to 10 mg/hr.  Will increase drip to 10 mg / hr and continue to monitor closely.

## 2018-12-05 NOTE — Progress Notes (Signed)
Spoke with Dr. Meda Coffee regarding patient's BP of 145/100.  Will titrate cardizem drip up to 20mg /h per verbal order from Dr. Meda Coffee.  Pt's HR in the 80s-90s. Will continue to monitor closely.

## 2018-12-05 NOTE — Progress Notes (Signed)
  Echocardiogram 2D Echocardiogram has been performed.  [name] 12/05/2018, 12:11 PM

## 2018-12-05 NOTE — Consult Note (Addendum)
Cardiology Consultation:   Patient ID: Terry Golden. MRN: [id number]; DOB: [birth year]  Admit date: 12/04/2018 Date of Consult: 12/05/2018  Primary Care Provider: Bernerd Limbo, MD Primary Cardiologist: [name], MD  Primary Electrophysiologist:  None    Patient Profile:   Terry Golden. is a 53 y.o. male with a hx of rheumatic fever, partial ASD and cleft mitral valve s/p ASD repair and mitral valve repair in 03/1999 who is being seen today for the evaluation of chest pain and atrial fibrillation with RVR at the request of Dr. Doristine Bosworth.  History of Present Illness:   [name] has a history as above with ASD and mitral valve repair in 03/1999. He had a  cath was 01/1999 as preop assessment which showed normal coronaries. He was admitted from 12/13-12/14/16 with worsening SOB, lightheadedness, presyncope, and chest tightness. Due to persistent chest pain, he had a cath in 09/2015. That showed nonobstructive CAD.   He also has a history of smoking and palpitations. He was last seen in the office by Dr. [name] on 11/17/2017 at which time he was noted to have atypical, non-exertional chest pain and shortness of breath. He was advised to stop smoking and increase exercise. There was some concern for afib. Due to daily symptoms of palpitations a 24 hr holter monitor was checked in 11/2017 and showed PACs and PVCs. He also had short runs of atrial tachycardia, longest was 11 beats. There was no sustained arrhythmias.   Last echo in 03/11/2017 showed normal LV function and stable mild-mod mitral stenosis and regurgitation.   On my questioning of the patient he tells that yesterday morning he was awakened by chest pain and shortness of breath. He also had his normal palpitations. He has had no edema. His BNP was 240.6, troponins negative X3. CXR showed borderline mild cardiomegaly with mild pulmonary edema, suggesting mild congestive heart failure. His blood pressure was also elevated. He  was given one dose of lasix 20 mg IV, metoprolol  5 mg IV X1 and amlodipine. He says that he feels a lot better today.   He continues to smoke but is down to 3 cigarettes per day from 1-2 PPD.   Past Medical History:  Diagnosis Date  . ASD (atrial septal defect)    large   . Congenital heart disease    ASVD, Cleft Mitral Valve  . 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 Mitral valve disease 03/25/1999   s/p mitral valve repair with commissuroplasty   . Rheumatic fever   . Syncope    1999  . VSD (ventricular septal defect)    small    Past Surgical History:  Procedure Laterality Date  . CARDIAC CATHETERIZATION N/A 09/20/2015   Procedure: Right/Left Heart Cath and Coronary Angiography;  Surgeon: Jettie Booze, MD;  Location: [facility];  Service: Cardiovascular;  Laterality: N/A;  . CARDIAC SURGERY     s/p partial AVSD repair and cleft mitral valve repair with commissuroplasty  . CARDIAC VALVE REPLACEMENT       Home Medications:  Prior to Admission medications   Medication Sig Start Date End Date Taking? Authorizing Provider  aspirin 325 MG tablet Take 325 mg by mouth daily.   Yes [provider]    Inpatient Medications: Scheduled Meds: . diltiazem  10 mg Intravenous Once  . enoxaparin (LOVENOX) injection  1 mg/kg Subcutaneous Q12H   Continuous Infusions: . sodium chloride 75 mL/hr at 12/05/18 0437  . diltiazem (  CARDIZEM) infusion     PRN Meds: acetaminophen **OR** acetaminophen, bisacodyl, HYDROcodone-acetaminophen, ondansetron **OR** ondansetron (ZOFRAN) IV, senna-docusate  Allergies:    Allergies  Allergen Reactions  . Tensilon [Edrophonium] Other (See Comments)    Blacked out     Social History:   Social History   Socioeconomic History  . Marital status: Divorced    Spouse name: Not on file  . Number of children: 1  . Years of education: 54  . Highest education level: Not on file  Occupational History  . Not on file  Social  Needs  . Financial resource strain: Not on file  . Food insecurity:    Worry: Not on file    Inability: Not on file  . Transportation needs:    Medical: Not on file    Non-medical: Not on file  Tobacco Use  . Smoking status: Current Every Day Smoker    Packs/day: 0.50    Types: Cigarettes  . Smokeless tobacco: Never Used  . Tobacco comment: 0.5 ppd; Started smoking as a teenager  Substance and Sexual Activity  . Alcohol use: Yes    Alcohol/week: 0.0 standard drinks    Comment: Occasional social  . Drug use: No  . Sexual activity: Not on file  Lifestyle  . Physical activity:    Days per week: Not on file    Minutes per session: Not on file  . Stress: Not on file  Relationships  . Social connections:    Talks on phone: Not on file    Gets together: Not on file    Attends religious service: Not on file    Active member of club or organization: Not on file    Attends meetings of clubs or organizations: Not on file    Relationship status: Not on file  . Intimate partner violence:    Fear of current or ex partner: Not on file    Emotionally abused: Not on file    Physically abused: Not on file    Forced sexual activity: Not on file  Other Topics Concern  . Not on file  Social History Narrative  . Not on file    Family History:    Family History  Problem Relation Age of Onset  . Diabetes Sister   . Mental illness Sister   . Heart disease Sister   . Arthritis Other   . Hypertension Other   . Alcohol abuse Other   . Heart attack Father   . Stroke Father   . Heart disease Other   . Arthritis Other   . Heart disease Other   . Stroke Other   . Mental illness Other   . Kidney disease Other      ROS:  Please see the history of present illness.   All other ROS reviewed and negative.     Physical Exam/Data:   Vitals:   12/04/18 1520 12/04/18 2029 12/05/18 0441 12/05/18 0613  BP: (!) 139/107 (!) 147/102 (!) 130/102   Pulse: 98 95 (!) 123   Resp: 18 16 18     Temp: 98.6 F (37 C) 98.3 F (36.8 C) 98.2 F (36.8 C)   TempSrc: Oral Oral Oral   SpO2: 94% 95% 91%   Weight:    65.9 kg  Height:        Intake/Output Summary (Last 24 hours) at 12/05/2018 1000 Last data filed at 12/05/2018 0500 Gross per 24 hour  Intake 1667.89 ml  Output -  Net 1667.89 ml  Last 3 Weights 12/05/2018 12/04/2018 11/14/2018  Weight (lbs) 145 lb 4.5 oz 150 lb 150 lb  Weight (kg) 65.9 kg 68.04 kg 68.04 kg     Body mass index is 21.45 kg/m.   GEN:No acute distress.   Neck:No JVD Cardiac:iRRR, 4/6 systolic murmurs, rubs, or gallops.  Respiratory:Clear to auscultation bilaterally. SF:KCLE, nontender, non-distended  MS:No edema; No deformity. warm Neuro:Nonfocal  Psych: Normal affect   EKG:  The EKG was personally reviewed and demonstrates:  new atrial fib/flutter vs MAT, 108 bmp, Incomplete RBBB and LAFB LVH with secondary repolarization abnormality  Telemetry:    Relevant CV Studies:  24 hr Holter Monitor 11/26/2017 Study Highlights    Normal sinus rhythm with rare PACs and PVCs.  Short runs of atrial tachycardia, longest was 11 beats.  No sustained arrthyhmias.     Echocardiogram 03/11/2017 Study Conclusions  - Left ventricle: S/P VSD closure with no residual shunt by   colorflow doppler. The cavity size was normal. Systolic function   was normal. The estimated ejection fraction was in the range of   55% to 60%. Wall motion was normal; there were no regional wall   motion abnormalities. Features are consistent with a pseudonormal   left ventricular filling pattern, with concomitant abnormal   relaxation and increased filling pressure (grade 2 diastolic   dysfunction). Doppler parameters are consistent with high   ventricular filling pressure. - Mitral valve: Diffuse thickening and calcification of the   anterior leaflet, with moderate involvement of chords, consistent   with rheumatic disease. The findings are consistent with mild to    moderate stenosis. There was mild to moderate regurgitation.   Pressure half-time: 203 ms. Mean gradient (D): 4 mm Hg. Valve   area by pressure half-time: 1.22 cm^2. - Left atrium: The atrium was mildly dilated. - Right ventricle: The cavity size was mildly dilated. Wall   thickness was normal. - Atrial septum: S/P ASD closure with no residual defect by   colorflow doppler. - Tricuspid valve: There was trivial regurgitation. - Pulmonic valve: There was trivial regurgitation.   Laboratory Data:  Chemistry Recent Labs  Lab 12/04/18 0859 12/05/18 0515  NA 141 137  K 3.7 3.9  CL 112* 111  CO2 22 21*  GLUCOSE 110* 100*  BUN 19 17  CREATININE 1.04 1.07  CALCIUM 9.0 8.2*  GFRNONAA >60 >60  GFRAA >60 >60  ANIONGAP 7 5    Recent Labs  Lab 12/05/18 0515  PROT 6.6  ALBUMIN 3.6  AST 18  ALT 21  ALKPHOS 56  BILITOT 0.8   Hematology Recent Labs  Lab 12/04/18 0859 12/05/18 0515  WBC 7.5 6.3  RBC 4.20* 4.14*  HGB 13.8 13.3  HCT 42.5 41.4  MCV 101.2* 100.0  MCH 32.9 32.1  MCHC 32.5 32.1  RDW 12.7 12.6  PLT 202 208   Cardiac Enzymes Recent Labs  Lab 12/04/18 0859 12/04/18 1517 12/04/18 2119  TROPONINI <0.03 <0.03 <0.03   No results for input(s): TROPIPOC in the last 168 hours.  BNP Recent Labs  Lab 12/04/18 0859  BNP 240.6*    DDimer No results for input(s): DDIMER in the last 168 hours.  Radiology/Studies:  Dg Chest 2 View  Result Date: 12/04/2018 CLINICAL DATA:  Chest pain and dyspnea EXAM: CHEST - 2 VIEW COMPARISON:  07/16/2015 chest radiograph. FINDINGS: Intact sternotomy wires. Stable cardiomediastinal silhouette with borderline mild cardiomegaly. No pneumothorax. No segment pleural effusion. Mild pulmonary edema. IMPRESSION: Borderline mild cardiomegaly with mild pulmonary edema,  suggesting mild congestive heart failure. Electronically Signed   By: Ilona Sorrel M.D.   On: 12/04/2018 10:11    Assessment and Plan:   1. Atrial flutter with variable  block -Pt has long history of Palpitations. Monitor in 11/2017 showed PVCs, PACs, brief atrial tachycardia.  -CHA2DS2/VAS Stroke Risk Score is 2 (HTN, possible also CHF)  -switch amlodipine to cardizem drip - start eliquis 5 mg po BID  2. H/O RHD - last echo in 2018 moderate MS and MR, I would repeat to re-evaluate  2. Acute diastolic CHF -Pt has a history of atypical chest pain. - BNP was elevated at 240.6, troponins negative X3. CXR showed borderline mild cardiomegaly with mild pulmonary edema, suggesting mild congestive heart failure.  - diuresed well overnight with weight down 3 kg, now appears euvolemic  3. Hypertension -Not previously treated. Elevated on admission and likely leading to mild pulmonary edema.  -As above will add CCB for MAT. Stop amlodipine. Can consider addition of HCTZ if BP still not well controlled with diltiazem or verapamil.   -Needs close follow up outpatient to continue to work on BP control.  4. S/P ASD and mitral valve repair in 03/1999 -Last echo in 03/11/2017 showed normal LV function and stable mild-mod mitral stenosis and regurgitation.   5. Tobacco use -Advise smoking cessation. He is down to 3 cigarettes per day from 1-2 PPD and trying to quit altogether.   For questions or updates, please contact [place] Please consult [email or web address] for contact info under   Signed, [name], MD 12/05/2018 10:00 AM

## 2018-12-05 NOTE — Progress Notes (Addendum)
ANTICOAGULATION CONSULT NOTE - Initial Consult  Pharmacy Consult for apixaban Indication: atrial flutter  Allergies  Allergen Reactions  . Tensilon [Edrophonium] Other (See Comments)    Blacked out     Patient Measurements: Height: 5\' 9"  (175.3 cm) Weight: 145 lb 4.5 oz (65.9 kg) IBW/kg (Calculated) : 70.7   Vital Signs: Temp: 98.2 F (36.8 C) (05/04 0441) Temp Source: Oral (05/04 0441) BP: 130/102 (05/04 0441) Pulse Rate: 123 (05/04 0441)  Labs: Recent Labs    12/04/18 0859 12/04/18 1517 12/04/18 2119 12/05/18 0515  HGB 13.8  --   --  13.3  HCT 42.5  --   --  41.4  PLT 202  --   --  208  APTT  --   --   --  48*  LABPROT  --   --   --  16.0*  INR  --   --   --  1.3*  CREATININE 1.04  --   --  1.07  TROPONINI <0.03 <0.03 <0.03  --     Estimated Creatinine Clearance: 74.4 mL/min (by C-G formula based on SCr of 1.07 mg/dL).   Medical History: Past Medical History:  Diagnosis Date  . ASD (atrial septal defect)    large   . Congenital heart disease    ASVD, Cleft Mitral Valve  . 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 Mitral valve disease 03/25/1999   s/p mitral valve repair with commissuroplasty   . Rheumatic fever   . Syncope    1999  . VSD (ventricular septal defect)    small    Medications:  Medications Prior to Admission  Medication Sig Dispense Refill Last Dose  . aspirin 325 MG tablet Take 325 mg by mouth daily.   12/03/2018 at Unknown time    Assessment: 53 yo M with Aflutter. Pharmacy consulted to dose apixaban.  Scr 1.07, CBC WNL.  Age <80, Wt > 60, SCr < 1.5.  No bleeding reported.    Plan:  DC LMWH 70 sq q12 Start apixaban 5 mg po bid  Eudelia Bunch, Pharm.D [phone] 12/05/2018 10:11 AM  Addendum: Educated pt on Eliquis and signs and symptoms of stroke. Gave pt 30 day free Eliquis card He questions if he should continue taking aspirin - will leave note for MD to address  Eudelia Bunch, Pharm.D [phone] 12/05/2018  3:59 PM  Addendum: No need for aspirin per Dr Liane Comber  Eudelia Bunch, Pharm.D  12/06/2018 1:56 PM

## 2018-12-05 NOTE — Progress Notes (Signed)
PCP was notified that the patient  had 16 beats of V-tach.  Vitals were: 98.27F;HR 123;RR18;130/102;91 % on room air. Awaiting any new orders.

## 2018-12-05 NOTE — Discharge Instructions (Addendum)
YOUR CARDIOLOGY TEAM HAS ARRANGED FOR AN E-VISIT FOR YOUR APPOINTMENT - PLEASE REVIEW IMPORTANT INFORMATION BELOW SEVERAL DAYS PRIOR TO YOUR APPOINTMENT  Due to the recent COVID-19 pandemic, we are transitioning in-person office visits to tele-medicine visits in an effort to decrease unnecessary exposure to our patients, their families, and staff. These visits are billed to your insurance just like a normal visit is. We also encourage you to sign up for MyChart if you have not already done so. You will need a smartphone if possible. For patients that do not have this, we can still complete the visit using a regular telephone but do prefer a smartphone to enable video when possible. You may have a family member that lives with you that can help. If possible, we also ask that you have a blood pressure cuff and scale at home to measure your blood pressure, heart rate and weight prior to your scheduled appointment. Patients with clinical needs that need an in-person evaluation and testing will still be able to come to the office if absolutely necessary. If you have any questions, feel free to call our office.  - If Apple, go to [employer] and type in MyChart in the search bar and download the app. If Android, ask patient to go to [employer] and type in [place] in the search bar and download the app. The app is free but as with any other app downloads, your phone may require you to verify saved payment information or Apple/Android password.  - You will need to then log into the app with your MyChart username and password, and select [facility] as your healthcare provider to link the account.  - When it is time for your visit, go to the MyChart app, find appointments, and click Begin Video Visit. Be sure to Select Allow for your device to access the Microphone and Camera for your visit. You will then be connected, and your provider will be with you shortly.  **If you have any issues connecting or need  assistance, please contact MyChart service desk [phone])**  **If using a computer, in order to ensure the best quality for your visit, you will need to use either of the following Internet Browsers: Insurance underwriter or Microsoft Edge**   IF USING DOXIMITY or [email or web address] - The staff will give you instructions on receiving your link to join the meeting the day of your visit.    2-3 DAYS BEFORE YOUR APPOINTMENT  You will receive a telephone call from one of our [facility] team members - your caller ID may say "Unknown caller." If this is a video visit, we will walk you through how to get the video launched on your phone. We will remind you check your blood pressure, heart rate and weight prior to your scheduled appointment. If you have an Apple Watch or Kardia, please upload any pertinent ECG strips the day before or morning of your appointment to [facility]. Our staff will also make sure you have reviewed the consent and agree to move forward with your scheduled tele-health visit.    THE DAY OF YOUR APPOINTMENT  Approximately 15 minutes prior to your scheduled appointment, you will receive a telephone call from one of [facility] team - your caller ID may say "Unknown caller."  Our staff will confirm medications, vital signs for the day and any symptoms you may be experiencing. Please have this information available prior to the time of visit start. It may also be helpful for you  to have a pad of paper and pen handy for any instructions given during your visit. They will also walk you through joining the smartphone meeting if this is a video visit.    CONSENT FOR TELE-HEALTH VISIT - PLEASE REVIEW  I hereby voluntarily request, consent and authorize [facility] and its employed or contracted physicians, physician assistants, nurse practitioners or other licensed health care professionals (the Practitioner), to provide me with telemedicine health care services (the Services") as deemed  necessary by the treating Practitioner. I acknowledge and consent to receive the Services by the Practitioner via telemedicine. I understand that the telemedicine visit will involve communicating with the Practitioner through live audiovisual communication technology and the disclosure of certain medical information by electronic transmission. I acknowledge that I have been given the opportunity to request an in-person assessment or other available alternative prior to the telemedicine visit and am voluntarily participating in the telemedicine visit.  I understand that I have the right to withhold or withdraw my consent to the use of telemedicine in the course of my care at any time, without affecting my right to future care or treatment, and that the Practitioner or I may terminate the telemedicine visit at any time. I understand that I have the right to inspect all information obtained and/or recorded in the course of the telemedicine visit and may receive copies of available information for a reasonable fee.  I understand that some of the potential risks of receiving the Services via telemedicine include:   Delay or interruption in medical evaluation due to technological equipment failure or disruption;  Information transmitted may not be sufficient (e.g. poor resolution of images) to allow for appropriate medical decision making by the Practitioner; and/or   In rare instances, security protocols could fail, causing a breach of personal health information.  Furthermore, I acknowledge that it is my responsibility to provide information about my medical history, conditions and care that is complete and accurate to the best of my ability. I acknowledge that Practitioner's advice, recommendations, and/or decision may be based on factors not within their control, such as incomplete or inaccurate data provided by me or distortions of diagnostic images or specimens that may result from electronic  transmissions. I understand that the practice of medicine is not an exact science and that Practitioner makes no warranties or guarantees regarding treatment outcomes. I acknowledge that I will receive a copy of this consent concurrently upon execution via email to the email address I last provided but may also request a printed copy by calling the office of [facility].    I understand that my insurance will be billed for this visit.   I have read or had this consent read to me.  I understand the contents of this consent, which adequately explains the benefits and risks of the Services being provided via telemedicine.   I have been provided ample opportunity to ask questions regarding this consent and the Services and have had my questions answered to my satisfaction.  I give my informed consent for the services to be provided through the use of telemedicine in my medical care  By participating in this telemedicine visit I agree to the above.   Information on my medicine - ELIQUIS (apixaban)  This medication education was reviewed with me or my healthcare representative as part of my discharge preparation.  Why was Eliquis prescribed for you? Eliquis was prescribed for you to reduce the risk of a blood clot forming that can cause  a stroke if you have a medical condition called atrial fibrillation (a type of irregular heartbeat).  What do You need to know about Eliquis ? Take your Eliquis TWICE DAILY - one tablet in the morning and one tablet in the evening with or without food. If you have difficulty swallowing the tablet whole please discuss with your pharmacist how to take the medication safely.  Take Eliquis exactly as prescribed by your doctor and DO NOT stop taking Eliquis without talking to the doctor who prescribed the medication.  Stopping may increase your risk of developing a stroke.  Refill your prescription before you run out.  After discharge, you should have regular  check-up appointments with your healthcare provider that is prescribing your Eliquis.  In the future your dose may need to be changed if your kidney function or weight changes by a significant amount or as you get older.  What do you do if you miss a dose? If you miss a dose, take it as soon as you remember on the same day and resume taking twice daily.  Do not take more than one dose of ELIQUIS at the same time to make up a missed dose.  Important Safety Information A possible side effect of Eliquis is bleeding. You should call your healthcare provider right away if you experience any of the following: ? Bleeding from an injury or your nose that does not stop. ? Unusual colored urine (red or dark brown) or unusual colored stools (red or black). ? Unusual bruising for unknown reasons. ? A serious fall or if you hit your head (even if there is no bleeding).  Some medicines may interact with Eliquis and might increase your risk of bleeding or clotting while on Eliquis. To help avoid this, consult your healthcare provider or pharmacist prior to using any new prescription or non-prescription medications, including herbals, vitamins, non-steroidal anti-inflammatory drugs (NSAIDs) and supplements.  This website has more information on Eliquis (apixaban): http://www.eliquis.com/eliquis/home

## 2018-12-06 ENCOUNTER — Telehealth (INDEPENDENT_AMBULATORY_CARE_PROVIDER_SITE_OTHER): Payer: Self-pay | Admitting: Interventional Cardiology

## 2018-12-06 ENCOUNTER — Encounter: Payer: Self-pay | Admitting: Interventional Cardiology

## 2018-12-06 ENCOUNTER — Telehealth: Payer: Self-pay | Admitting: Interventional Cardiology

## 2018-12-06 VITALS — Ht 69.0 in | Wt 143.0 lb

## 2018-12-06 DIAGNOSIS — R931 Abnormal findings on diagnostic imaging of heart and coronary circulation: Secondary | ICD-10-CM

## 2018-12-06 DIAGNOSIS — I340 Nonrheumatic mitral (valve) insufficiency: Secondary | ICD-10-CM

## 2018-12-06 DIAGNOSIS — I4892 Unspecified atrial flutter: Secondary | ICD-10-CM

## 2018-12-06 DIAGNOSIS — I484 Atypical atrial flutter: Secondary | ICD-10-CM

## 2018-12-06 LAB — CBC WITH DIFFERENTIAL/PLATELET
Abs Immature Granulocytes: 0.03 10*3/uL (ref 0.00–0.07)
Basophils Absolute: 0 10*3/uL (ref 0.0–0.1)
Basophils Relative: 0 %
Eosinophils Absolute: 0.3 10*3/uL (ref 0.0–0.5)
Eosinophils Relative: 3 %
HCT: 41.3 % (ref 39.0–52.0)
Hemoglobin: 13.8 g/dL (ref 13.0–17.0)
Immature Granulocytes: 0 %
Lymphocytes Relative: 18 %
Lymphs Abs: 1.6 10*3/uL (ref 0.7–4.0)
MCH: 33.3 pg (ref 26.0–34.0)
MCHC: 33.4 g/dL (ref 30.0–36.0)
MCV: 99.8 fL (ref 80.0–100.0)
Monocytes Absolute: 0.4 10*3/uL (ref 0.1–1.0)
Monocytes Relative: 4 %
Neutro Abs: 6.8 10*3/uL (ref 1.7–7.7)
Neutrophils Relative %: 75 %
Platelets: 201 10*3/uL (ref 150–400)
RBC: 4.14 MIL/uL — ABNORMAL LOW (ref 4.22–5.81)
RDW: 12.7 % (ref 11.5–15.5)
WBC: 9.1 10*3/uL (ref 4.0–10.5)
nRBC: 0 % (ref 0.0–0.2)

## 2018-12-06 LAB — COMPREHENSIVE METABOLIC PANEL
ALT: 29 U/L (ref 0–44)
AST: 28 U/L (ref 15–41)
Albumin: 3.7 g/dL (ref 3.5–5.0)
Alkaline Phosphatase: 59 U/L (ref 38–126)
Anion gap: 9 (ref 5–15)
BUN: 11 mg/dL (ref 6–20)
CO2: 20 mmol/L — ABNORMAL LOW (ref 22–32)
Calcium: 8.7 mg/dL — ABNORMAL LOW (ref 8.9–10.3)
Chloride: 108 mmol/L (ref 98–111)
Creatinine, Ser: 1.01 mg/dL (ref 0.61–1.24)
GFR calc Af Amer: >60 mL/min (ref >60)
GFR calc non Af Amer: >60 mL/min (ref >60)
Glucose, Bld: 163 mg/dL — ABNORMAL HIGH (ref 70–99)
Potassium: 3.8 mmol/L (ref 3.5–5.1)
Sodium: 137 mmol/L (ref 135–145)
Total Bilirubin: 0.6 mg/dL (ref 0.3–1.2)
Total Protein: 6.8 g/dL (ref 6.5–8.1)

## 2018-12-06 LAB — GLUCOSE, CAPILLARY: Glucose-Capillary: 97 mg/dL (ref 70–99)

## 2018-12-06 MED ORDER — LISINOPRIL 5 MG PO TABS
5.0000 mg | ORAL_TABLET | Freq: Every day | ORAL | Status: DC
  Administered 2018-12-06 – 2018-12-07 (×2): 5 mg via ORAL
  Filled 2018-12-06 (×2): qty 1

## 2018-12-06 MED ORDER — NICOTINE 7 MG/24HR TD PT24
7.0000 mg | MEDICATED_PATCH | Freq: Every day | TRANSDERMAL | Status: DC
  Administered 2018-12-06 – 2018-12-07 (×2): 7 mg via TRANSDERMAL
  Filled 2018-12-06 (×2): qty 1

## 2018-12-06 NOTE — Progress Notes (Signed)
Virtual Visit via Video Note   This visit type was conducted due to national recommendations for restrictions regarding the COVID-19 Pandemic (e.g. social distancing) in an effort to limit this patient's exposure and mitigate transmission in our community.  Due to his co-morbid illnesses, this patient is at least at moderate risk for complications without adequate follow up.  This format is felt to be most appropriate for this patient at this time.  All issues noted in this document were discussed and addressed.  A limited physical exam was performed with this format.  Please refer to the patient's chart for his consent to telehealth for [facility].   Date:  12/06/2018   ID:  Terry Santee., DOB [birth year], MRN [id number]  Patient Location: Other:  hospital Provider Location: Office  PCP:  Bernerd Limbo, MD  Cardiologist:  [name], MD  Electrophysiologist:  None   Evaluation Performed:  Follow-Up Visit  Chief Complaint:  Atrial flutter  History of Present Illness:    Terry Ellery. is a 53 y.o. 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 He had acath was 01/1999 as preop assessment which showed normal coronaries. He was admitted from 12/13-12/14/16 with worsening SOB, lightheadedness, presyncope, and chest tightness.  Due to persistent chest pain, he had a cath in 2/17. THis showed nonobstructive CAD.   Hospitalized with atrial flutter, CHF.  Has been rate controlled and diuresed.  Hoping to go home today.  Visit is while the patient is hospitalized.   Hopefully, he will transition to oral dilt soon and can be discharged.  The patient does not have symptoms concerning for COVID-19 infection (fever, chills, cough, or new shortness of breath).    Past Medical History:  Diagnosis Date  . ASD (atrial septal defect)    large   . Congenital heart disease    ASVD, Cleft Mitral Valve   . 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 Mitral valve disease 03/25/1999   s/p mitral valve repair with commissuroplasty   . Rheumatic fever   . Syncope    1999  . VSD (ventricular septal defect)    small   Past Surgical History:  Procedure Laterality Date  . CARDIAC CATHETERIZATION N/A 09/20/2015   Procedure: Right/Left Heart Cath and Coronary Angiography;  Surgeon: Jettie Booze, MD;  Location: [facility];  Service: Cardiovascular;  Laterality: N/A;  . CARDIAC SURGERY     s/p partial AVSD repair and cleft mitral valve repair with commissuroplasty  . CARDIAC VALVE REPLACEMENT       Current Meds  Medication Sig  . aspirin 325 MG tablet Take 325 mg by mouth daily.     Allergies:   Tensilon [edrophonium]   Social History   Tobacco Use  . Smoking status: Current Every Day Smoker    Packs/day: 0.50    Types: Cigarettes  . Smokeless tobacco: Never Used  . Tobacco comment: 0.5 ppd; Started smoking as a teenager  Substance Use Topics  . Alcohol use: Yes    Alcohol/week: 0.0 standard drinks    Comment: Occasional social  . Drug use: No     Family Hx: The patient's family history includes Alcohol abuse in an other family member; Arthritis in some other family members; Diabetes in his sister; Heart attack in his father; Heart disease in his sister and other family members; Hypertension in an other family member;  Kidney disease in an other family member; Mental illness in his sister and another family member; Stroke in his father and another family member.  ROS:   Please see the history of present illness.    Recent shortness of breath All other systems reviewed and are negative.   Prior CV studies:   The following studies were reviewed today:  Cath results as noted above Left Ventricle: The left ventricle has mild-moderately reduced systolic function, with an ejection fraction of 40-45%.   Labs/Other Tests and Data Reviewed:    EKG:  An ECG dated  12/05/2018 was personally reviewed today and demonstrated:  Atrial flutter  Recent Labs: 12/04/2018: B Natriuretic Peptide 240.6; TSH 1.391 12/06/2018: ALT 29; BUN 11; Creatinine, Ser 1.01; Hemoglobin 13.8; Platelets 201; Potassium 3.8; Sodium 137   Recent Lipid Panel Lab Results  Component Value Date/Time   CHOL 161 12/04/2018 08:59 AM   CHOL 176 09/05/2018 10:00 AM   TRIG 53 12/04/2018 08:59 AM   HDL 43 12/04/2018 08:59 AM   HDL 54 09/05/2018 10:00 AM   CHOLHDL 3.7 12/04/2018 08:59 AM   LDLCALC 107 (H) 12/04/2018 08:59 AM   LDLCALC 113 (H) 09/05/2018 10:00 AM    Wt Readings from Last 3 Encounters:  12/06/18 143 lb 4.8 oz (65 kg)  12/06/18 143 lb (64.9 kg)  11/14/18 150 lb (68 kg)     Objective:    Vital Signs:  Ht 5\' 9"  (1.753 m)   Wt 143 lb (64.9 kg)   BMI 21.12 kg/m  140/107 yesterday in the hosptial  VITAL SIGNS:  reviewed GEN:  no acute distress RESPIRATORY:  normal respiratory effort, symmetric expansion PSYCH:  normal affect video exam- limited  ASSESSMENT & PLAN:    1. Atrial flutter: Given that he had heart failure, will refer to EP for possible ablation.  He will be discharged from the hospital on Eliquis, diltiazem and Lasix I presume.  He will need to stay on his Eliquis for 3 to 4 weeks prior to consideration of an ablation.   2. Blood pressure has been elevated.  He will go home on some oral diltiazem.  We will have to see what his blood pressure does on the medication.   3. Tobacco abuse: He has been using a nicotine patch with success while in the hospital. 4. Ejection fraction mildly decreased at last check.  Mitral valve disease noted as well.  I suspect his LVEF will improve with better rate control.  We will have to follow this going forward.  COVID-19 Education: The signs and symptoms of COVID-19 were discussed with the patient and how to seek care for testing (follow up with PCP or arrange E-visit).  2The importance of social distancing was discussed  today.  Time:   Today, I have spent 20 minutes with the patient with telehealth technology discussing the above problems.     Medication Adjustments/Labs and Tests Ordered: Current medicines are reviewed at length with the patient today.  Concerns regarding medicines are outlined above.   Tests Ordered: No orders of the defined types were placed in this encounter.   Medication Changes: No orders of the defined types were placed in this encounter.   Disposition:  Follow up After his EP appointment  Signed, [name], MD  12/06/2018 11:49 AM    [place]

## 2018-12-06 NOTE — Progress Notes (Signed)
Spoke with Marcello Moores at [facility] who is aware that the patient will need to be here at 1130 for a 1230 TEE/Cardioversion. Pt's RN in 1408, Joellen Jersey, aware and will have signed consent form sent with patient tomorrow. Jobe Igo, RN

## 2018-12-06 NOTE — Progress Notes (Signed)
PROGRESS NOTE    Terry Golden.  POE:[id number] DOB: 12/05/1965 DOA: 12/04/2018 PCP: Bernerd Limbo, MD (Confirm with patient/family/NH records and if not entered, this HAS to be entered at [facility] point of entry. "No PCP" if truly none.)   Brief Narrative:  Terry Golden. is a 53 y.o. male with medical history significant of congenital heart disease status post valvular replacement surgery back in 2000, tobacco use came to the ED on 12/04/2018 with complaints of lightheadedness, palpitation, shortness of breath and atypical chest pain that woke him up early in the morning. He noticed his heartbeat was getting very irregular.  Previously he has been seen by Dr. [name] from cardiology. Continues to smoke tobacco daily.  In the ER patient was noted to be in atrial fibrillation with RVR with some chest discomfort.  Patient was admitted under hospitalist service and cardiology was consulted.  Since patient's heart rate was controlled so no rate control medications were added.  He was started on weight-based Lovenox.  He was started on amlodipine for blood pressure control.  He remained in atrial fibrillation and cardiology was consulted who had started the patient on Cardizem drip and amlodipine was discontinued and Lovenox was switched to Eliquis.  Patient has been symptom-free and has been requesting to send him home.  Cardiology is planning on doing TEE cardioversion tomorrow.  Consultants:   Cardiology  Procedures:   None  Antimicrobials:   None   Subjective: Patient seen and examined.  He denies any chest pain or any shortness of breath.  Denies any palpitation.  No other complaint.  Wants to go home.  Objective: Vitals:   12/05/18 2053 12/06/18 0051 12/06/18 0445 12/06/18 0500  BP: (!) 161/88 134/87 (!) 157/96   Pulse: (!) 43 60 60   Resp: 18  20   Temp: 98.6 F (37 C)  97.9 F (36.6 C)   TempSrc: Oral  Oral   SpO2: 96%  95%   Weight:    65 kg  Height:         Intake/Output Summary (Last 24 hours) at 12/06/2018 1310 Last data filed at 12/06/2018 1000 Gross per 24 hour  Intake 3290.66 ml  Output -  Net 3290.66 ml   Filed Weights   12/04/18 0843 12/05/18 0613 12/06/18 0500  Weight: 68 kg 65.9 kg 65 kg    Examination:  General exam: Appears calm and comfortable  Respiratory system: Clear to auscultation. Respiratory effort normal. Cardiovascular system: S1 & S2 heard, irregularly irregular rate and rhythm. No JVD, murmurs, rubs, gallops or clicks. No pedal edema. Gastrointestinal system: Abdomen is nondistended, soft and nontender. No organomegaly or masses felt. Normal bowel sounds heard. Central nervous system: Alert and oriented. No focal neurological deficits. Extremities: Symmetric 5 x 5 power. Skin: No rashes, lesions or ulcers Psychiatry: Judgement and insight appear normal. Mood & affect appropriate.    Data Reviewed: I have personally reviewed following labs and imaging studies  CBC: Recent Labs  Lab 12/04/18 0859 12/05/18 0515 12/06/18 0549  WBC 7.5 6.3 9.1  NEUTROABS  --   --  6.8  HGB 13.8 13.3 13.8  HCT 42.5 41.4 41.3  MCV 101.2* 100.0 99.8  PLT 202 208 315   Basic Metabolic Panel: Recent Labs  Lab 12/04/18 0859 12/05/18 0515 12/06/18 0549  NA 141 137 137  K 3.7 3.9 3.8  CL 112* 111 108  CO2 22 21* 20*  GLUCOSE 110* 100* 163*  BUN 19 17 11  CREATININE 1.04 1.07 1.01  CALCIUM 9.0 8.2* 8.7*   GFR: Estimated Creatinine Clearance: 77.8 mL/min (by C-G formula based on SCr of 1.01 mg/dL). Liver Function Tests: Recent Labs  Lab 12/05/18 0515 12/06/18 0549  AST 18 28  ALT 21 29  ALKPHOS 56 59  BILITOT 0.8 0.6  PROT 6.6 6.8  ALBUMIN 3.6 3.7   No results for input(s): LIPASE, AMYLASE in the last 168 hours. No results for input(s): AMMONIA in the last 168 hours. Coagulation Profile: Recent Labs  Lab 12/05/18 0515  INR 1.3*   Cardiac Enzymes: Recent Labs  Lab 12/04/18 0859 12/04/18 1517 12/04/18  2119  TROPONINI <0.03 <0.03 <0.03   BNP (last 3 results) No results for input(s): PROBNP in the last 8760 hours. HbA1C: Recent Labs    12/04/18 2119  HGBA1C 5.1   CBG: Recent Labs  Lab 12/05/18 0744 12/06/18 0754  GLUCAP 84 97   Lipid Profile: Recent Labs    12/04/18 0859  CHOL 161  HDL 43  LDLCALC 107*  TRIG 53  CHOLHDL 3.7   Thyroid Function Tests: Recent Labs    12/04/18 0859  TSH 1.391   Anemia Panel: No results for input(s): VITAMINB12, FOLATE, FERRITIN, TIBC, IRON, RETICCTPCT in the last 72 hours. Sepsis Labs: No results for input(s): PROCALCITON, LATICACIDVEN in the last 168 hours.  Recent Results (from the past 240 hour(s))  SARS Coronavirus 2 [facility] order, Performed in [facility] hospital lab)     Status: None   Collection Time: 12/04/18 12:09 PM  Result Value Ref Range Status   SARS Coronavirus 2 NEGATIVE NEGATIVE Final    Comment: (NOTE) If result is NEGATIVE SARS-CoV-2 target nucleic acids are NOT DETECTED. The SARS-CoV-2 RNA is generally detectable in upper and lower  respiratory specimens during the acute phase of infection. The lowest  concentration of SARS-CoV-2 viral copies this assay can detect is 250  copies / mL. A negative result does not preclude SARS-CoV-2 infection  and should not be used as the sole basis for treatment or other  patient management decisions.  A negative result may occur with  improper specimen collection / handling, submission of specimen other  than nasopharyngeal swab, presence of viral mutation(s) within the  areas targeted by this assay, and inadequate number of viral copies  (<250 copies / mL). A negative result must be combined with clinical  observations, patient history, and epidemiological information. If result is POSITIVE SARS-CoV-2 target nucleic acids are DETECTED. The SARS-CoV-2 RNA is generally detectable in upper and lower  respiratory specimens dur ing the acute phase of infection.  Positive   results are indicative of active infection with SARS-CoV-2.  Clinical  correlation with patient history and other diagnostic information is  necessary to determine patient infection status.  Positive results do  not rule out bacterial infection or co-infection with other viruses. If result is PRESUMPTIVE POSTIVE SARS-CoV-2 nucleic acids MAY BE PRESENT.   A presumptive positive result was obtained on the submitted specimen  and confirmed on repeat testing.  While 2019 novel coronavirus  (SARS-CoV-2) nucleic acids may be present in the submitted sample  additional confirmatory testing may be necessary for epidemiological  and / or clinical management purposes  to differentiate between  SARS-CoV-2 and other Sarbecovirus currently known to infect humans.  If clinically indicated additional testing with an alternate test  methodology [phone]) is advised. The SARS-CoV-2 RNA is generally  detectable in upper and lower respiratory sp ecimens during the acute  phase  of infection. The expected result is Negative. Fact Sheet for Patients:  StrictlyIdeas.no Fact Sheet for Healthcare Providers: BankingDealers.co.za This test is not yet approved or cleared by the [place] FDA and has been authorized for detection and/or diagnosis of SARS-CoV-2 by FDA under an Emergency Use Authorization (EUA).  This EUA will remain in effect (meaning this test can be used) for the duration of the COVID-19 declaration under Section 564(b)(1) of the Act, 21 U.S.C. section 360bbb-3(b)(1), unless the authorization is terminated or revoked sooner. Performed at [facility], [place] [address], [place], [place] [address]       Radiology Studies: No results found.  Scheduled Meds: . apixaban  5 mg Oral BID  . lisinopril  5 mg Oral Daily  . nicotine  7 mg Transdermal Daily   Continuous Infusions: . sodium chloride 75 mL/hr at 12/06/18 0635  .  diltiazem (CARDIZEM) infusion 15 mg/hr (12/06/18 0636)     LOS: 1 day   Assessment & Plan:   Principal Problem:   Atrial fibrillation with RVR (HCC) Active Problems:   ASD (atrial septal defect)   VSD (ventricular septal defect)   Mitral valve disease   Congenital heart disease   Shortness of breath   Pain in the chest   Atypical atrial flutter (HCC)   Acute diastolic CHF (congestive heart failure), NYHA class 3 (HCC)   Atypical chest pain: Ruled out of MI with negative cardiac enzymes.  Echo shows hypokinesis of left ventricle, mid apical inferior wall and inferior septal wall with left ventricular ejection fraction of 40 to 45%.  Slightly reduced right ventricle systolic function. TSH negative.  Has history of nonobstructive CAD on left heart catheterization in 2017.  new onset atrial fibrillation with RVR: Rates fairly controlled and he is on Cardizem drip and on Eliquis.  Plans for TEE cardioversion tomorrow by cardiology.  Hypertension: Slightly elevated.  Added lisinopril 5 mg.  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 [name] outpatient.  Currently continue to monitor.  Active tobacco use -Counseled to quit smoking.  Nicotine patch.  DVT prophylaxis: Eliquis Code Status: Full code Family Communication: Discussed with patient Disposition Plan: To be determined based on clinical improvement and cardiology clearance   Time spent: 29 min    Darliss Cheney, MD [facility] Pager [phone]  If 7PM-7AM, please contact night-coverage [email or web address] Password [email or web address] 12/06/2018, 1:10 PM

## 2018-12-06 NOTE — Progress Notes (Signed)
Progress Note  Patient Name: Terry Golden. Date of Encounter: 12/06/2018  Primary Cardiologist: [name], MD   Subjective   Feels better and would want to go home.  Inpatient Medications    Scheduled Meds: . apixaban  5 mg Oral BID  . nicotine  7 mg Transdermal Daily   Continuous Infusions: . sodium chloride 75 mL/hr at 12/06/18 0635  . diltiazem (CARDIZEM) infusion 15 mg/hr (12/06/18 0636)   PRN Meds: acetaminophen **OR** acetaminophen, bisacodyl, HYDROcodone-acetaminophen, ondansetron **OR** ondansetron (ZOFRAN) IV, senna-docusate   Vital Signs    Vitals:   12/05/18 2053 12/06/18 0051 12/06/18 0445 12/06/18 0500  BP: (!) 161/88 134/87 (!) 157/96   Pulse: (!) 43 60 60   Resp: 18  20   Temp: 98.6 F (37 C)  97.9 F (36.6 C)   TempSrc: Oral  Oral   SpO2: 96%  95%   Weight:    65 kg  Height:        Intake/Output Summary (Last 24 hours) at 12/06/2018 1225 Last data filed at 12/06/2018 1000 Gross per 24 hour  Intake 3290.66 ml  Output -  Net 3290.66 ml   Last 3 Weights 12/06/2018 12/06/2018 12/05/2018  Weight (lbs) 143 lb 143 lb 4.8 oz 145 lb 4.5 oz  Weight (kg) 64.864 kg 65 kg 65.9 kg     Telemetry    A-flutter with variable block- Personally Reviewed  ECG    No new reading - Personally Reviewed  Physical Exam   GEN: No acute distress.   Neck: No JVD Cardiac: iRRR, 4/6 systolic murmurs, rubs, or gallops.  Respiratory: Clear to auscultation bilaterally. GI: Soft, nontender, non-distended  MS: No edema; No deformity. Neuro:  Nonfocal  Psych: Normal affect   Labs    Chemistry Recent Labs  Lab 12/04/18 0859 12/05/18 0515 12/06/18 0549  NA 141 137 137  K 3.7 3.9 3.8  CL 112* 111 108  CO2 22 21* 20*  GLUCOSE 110* 100* 163*  BUN 19 17 11   CREATININE 1.04 1.07 1.01  CALCIUM 9.0 8.2* 8.7*  PROT  --  6.6 6.8  ALBUMIN  --  3.6 3.7  AST  --  18 28  ALT  --  21 29  ALKPHOS  --  56 59  BILITOT  --  0.8 0.6  GFRNONAA >60 >60 >60  GFRAA  >60 >60 >60  ANIONGAP 7 5 9      Hematology Recent Labs  Lab 12/04/18 0859 12/05/18 0515 12/06/18 0549  WBC 7.5 6.3 9.1  RBC 4.20* 4.14* 4.14*  HGB 13.8 13.3 13.8  HCT 42.5 41.4 41.3  MCV 101.2* 100.0 99.8  MCH 32.9 32.1 33.3  MCHC 32.5 32.1 33.4  RDW 12.7 12.6 12.7  PLT 202 208 201    Cardiac Enzymes Recent Labs  Lab 12/04/18 0859 12/04/18 1517 12/04/18 2119  TROPONINI <0.03 <0.03 <0.03   No results for input(s): TROPIPOC in the last 168 hours.   BNP Recent Labs  Lab 12/04/18 0859  BNP 240.6*     DDimer No results for input(s): DDIMER in the last 168 hours.   Radiology    No results found.  Cardiac Studies   Echocardiogram 03/11/2017 Study Conclusions  - Left ventricle: S/P VSD closure with no residual shunt by colorflow doppler. The cavity size was normal. Systolic function was normal. The estimated ejection fraction was in the range of 55% to 60%. Wall motion was normal; there were no regional wall motion abnormalities. Features are  consistent with a pseudonormal left ventricular filling pattern, with concomitant abnormal relaxation and increased filling pressure (grade 2 diastolic dysfunction). Doppler parameters are consistent with high ventricular filling pressure. - Mitral valve: Diffuse thickening and calcification of the anterior leaflet, with moderate involvement of chords, consistent with rheumatic disease. The findings are consistent with mild to moderate stenosis. There was mild to moderate regurgitation. Pressure half-time: 203 ms. Mean gradient (D): 4 mm Hg. Valve area by pressure half-time: 1.22 cm^2. - Left atrium: The atrium was mildly dilated. - Right ventricle: The cavity size was mildly dilated. Wall thickness was normal. - Atrial septum: S/P ASD closure with no residual defect by colorflow doppler. - Tricuspid valve: There was trivial regurgitation. - Pulmonic valve: There was trivial regurgitation.    Patient Profile     53 y.o. male with h/o ASD repair and mitral valve repair in 03/1999. He had acath was 01/1999 as preop assessment which showed normal coronaries. He was admitted from 12/13-12/14/16 with worsening SOB, lightheadedness, presyncope, and chest tightness. Due to persistent chest pain, he had a cath in 09/2015. That showed nonobstructive CAD.   Assessment & Plan    1. Atrial flutter with variable block -Pt has long history of Palpitations. Monitor in 11/2017 showed PVCs, PACs, brief atrial tachycardia.  -CHA2DS2/VAS Stroke Risk Score is 2 (HTN, possible also CHF)  -Trickle rate controlled by Cardizem drip but he remains in atrial flutter - started on eliquis 5 mg po BID however new echocardiogram shows moderate to severe mitral regurgitation so eventually he will need to be switched to warfarin. -I would arrange for TEE with cardioversion for tomorrow to rule out clot and cardiovert.  This will also allow me to evaluate anatomy of his repaired mitral valve and mechanism of regurgitation to evaluate potential strategy for treatment -MitraClip versus mitral valve repair/replacement, we need to evaluate degree of mitral stenosis that might be a limiting factor for MitraClip.  2. H/O RHD -As above, the patient was originally seen by Dr. 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  He underwent mitral valve repair and closure of ASD and VSD 2020 at [facility].  2. Acute diastolic CHF -Pt has a history of atypical chest pain. - BNP was elevated at 240.6, troponins negative X3. CXR showed borderline mild cardiomegaly with mild pulmonary edema, suggesting mild congestive heart failure.  - diuresed well overnight with weight down 3 kg, now appears euvolemic  3. Hypertension -Not previously treated. Elevated on admission and likely leading  to mild pulmonary edema.  -As above will add CCB for MAT. Stop amlodipine. Can consider addition of HCTZ if BP still not well controlled with diltiazem or verapamil.   -Needs close follow up outpatient to continue to work on BP control. -I will add lisinopril 5 mg daily.  4. S/P ASD and mitral valve repair in 03/1999 -Last echo in 03/11/2017 showed normal LV function and stable mild-mod mitral stenosis and regurgitation.   5. Tobacco use -Advise smoking cessation. He is down to 3 cigarettes per day from 1-2 PPD and trying to quit altogether.   Anticipated discharge tomorrow after TEE with cardioversion  For questions or updates, please contact [place] Please consult [email or web address] for contact info under     Signed, [name], MD  12/06/2018, 12:25 PM

## 2018-12-06 NOTE — Telephone Encounter (Signed)
Encounter not needed

## 2018-12-06 NOTE — Patient Instructions (Signed)
Medication Instructions:  Your physician recommends that you continue on your current medications as directed. Please refer to the Current Medication list given to you today.  If you need a refill on your cardiac medications before your next appointment, please call your pharmacy.   Lab work: None Ordered  If you have labs (blood work) drawn today and your tests are completely normal, you will receive your results only by: Marland Kitchen MyChart Message (if you have MyChart) OR . A paper copy in the mail If you have any lab test that is abnormal or we need to change your treatment, we will call you to review the results.  Testing/Procedures: None ordered  Follow-Up: . You have been referred to EP for a televisit to discuss Aflutter Ablation. You will be contacted to schedule the appointment.    Any Other Special Instructions Will Be Listed Below (If Applicable).

## 2018-12-06 NOTE — Progress Notes (Signed)
Patient had 17 beat run of v-tach. Patient is asymptomatic. Notified on-call provider. Cardiac monitoring shows patient is back to previous rhythm of Afib, rate in 70s. Will continue to closely monitor.

## 2018-12-07 ENCOUNTER — Inpatient Hospital Stay (HOSPITAL_COMMUNITY): Payer: Self-pay | Admitting: Certified Registered Nurse Anesthetist

## 2018-12-07 ENCOUNTER — Encounter (HOSPITAL_COMMUNITY): Payer: Self-pay

## 2018-12-07 ENCOUNTER — Inpatient Hospital Stay (HOSPITAL_COMMUNITY): Payer: Self-pay

## 2018-12-07 ENCOUNTER — Encounter (HOSPITAL_COMMUNITY)
Admission: EM | Disposition: A | Discharge status: Home / Self Care | Payer: Self-pay | Source: Home / Self Care | Attending: Family Medicine

## 2018-12-07 DIAGNOSIS — I340 Nonrheumatic mitral (valve) insufficiency: Secondary | ICD-10-CM

## 2018-12-07 DIAGNOSIS — I4892 Unspecified atrial flutter: Secondary | ICD-10-CM

## 2018-12-07 HISTORY — PX: TEE WITHOUT CARDIOVERSION: SHX5443

## 2018-12-07 HISTORY — PX: CARDIOVERSION: SHX1299

## 2018-12-07 LAB — CBC WITH DIFFERENTIAL/PLATELET
Abs Immature Granulocytes: 0.02 10*3/uL (ref 0.00–0.07)
Basophils Absolute: 0 10*3/uL (ref 0.0–0.1)
Basophils Relative: 0 %
Eosinophils Absolute: 0.3 10*3/uL (ref 0.0–0.5)
Eosinophils Relative: 3 %
HCT: 41 % (ref 39.0–52.0)
Hemoglobin: 13.2 g/dL (ref 13.0–17.0)
Immature Granulocytes: 0 %
Lymphocytes Relative: 27 %
Lymphs Abs: 2.1 10*3/uL (ref 0.7–4.0)
MCH: 31.9 pg (ref 26.0–34.0)
MCHC: 32.2 g/dL (ref 30.0–36.0)
MCV: 99 fL (ref 80.0–100.0)
Monocytes Absolute: 0.4 10*3/uL (ref 0.1–1.0)
Monocytes Relative: 6 %
Neutro Abs: 4.9 10*3/uL (ref 1.7–7.7)
Neutrophils Relative %: 64 %
Platelets: 196 10*3/uL (ref 150–400)
RBC: 4.14 MIL/uL — ABNORMAL LOW (ref 4.22–5.81)
RDW: 12.6 % (ref 11.5–15.5)
WBC: 7.8 10*3/uL (ref 4.0–10.5)
nRBC: 0 % (ref 0.0–0.2)

## 2018-12-07 LAB — COMPREHENSIVE METABOLIC PANEL
ALT: 35 U/L (ref 0–44)
AST: 27 U/L (ref 15–41)
Albumin: 3.5 g/dL (ref 3.5–5.0)
Alkaline Phosphatase: 53 U/L (ref 38–126)
Anion gap: 7 (ref 5–15)
BUN: 9 mg/dL (ref 6–20)
CO2: 21 mmol/L — ABNORMAL LOW (ref 22–32)
Calcium: 8.6 mg/dL — ABNORMAL LOW (ref 8.9–10.3)
Chloride: 111 mmol/L (ref 98–111)
Creatinine, Ser: 0.96 mg/dL (ref 0.61–1.24)
GFR calc Af Amer: >60 mL/min (ref >60)
GFR calc non Af Amer: >60 mL/min (ref >60)
Glucose, Bld: 105 mg/dL — ABNORMAL HIGH (ref 70–99)
Potassium: 3.7 mmol/L (ref 3.5–5.1)
Sodium: 139 mmol/L (ref 135–145)
Total Bilirubin: 0.8 mg/dL (ref 0.3–1.2)
Total Protein: 6.4 g/dL — ABNORMAL LOW (ref 6.5–8.1)

## 2018-12-07 LAB — GLUCOSE, CAPILLARY: Glucose-Capillary: 96 mg/dL (ref 70–99)

## 2018-12-07 SURGERY — ECHOCARDIOGRAM, TRANSESOPHAGEAL
Anesthesia: Monitor Anesthesia Care

## 2018-12-07 MED ORDER — PROPOFOL 500 MG/50ML IV EMUL
INTRAVENOUS | Status: DC | PRN
  Administered 2018-12-07: 100 ug/kg/min via INTRAVENOUS

## 2018-12-07 MED ORDER — LACTATED RINGERS IV SOLN: INTRAVENOUS | Status: DC | PRN

## 2018-12-07 MED ORDER — APIXABAN 5 MG PO TABS: 5.0000 mg | ORAL_TABLET | Freq: Two times a day (BID) | ORAL | 0 refills | Status: DC

## 2018-12-07 MED ORDER — LISINOPRIL 5 MG PO TABS: 5.0000 mg | ORAL_TABLET | Freq: Every day | ORAL | 0 refills | Status: DC

## 2018-12-07 MED ORDER — DILTIAZEM HCL ER COATED BEADS 240 MG PO CP24: 240.0000 mg | ORAL_CAPSULE | Freq: Every day | ORAL | 0 refills | Status: DC

## 2018-12-07 MED ORDER — DILTIAZEM HCL ER COATED BEADS 240 MG PO CP24
240.0000 mg | ORAL_CAPSULE | Freq: Every day | ORAL | Status: DC
  Administered 2018-12-07: 240 mg via ORAL
  Filled 2018-12-07: qty 1

## 2018-12-07 NOTE — CV Procedure (Signed)
     Transesophageal Echocardiogram Note  Terry Golden [id number] [birth year]  Procedure: Transesophageal Echocardiogram Indications: atrial flutter, mitral regurgitation  Procedure Details Consent: Obtained Time Out: Verified patient identification, verified procedure, site/side was marked, verified correct patient position, special equipment/implants available, Radiology Safety Procedures followed,  medications/allergies/relevent history reviewed, required imaging and test results available.  Performed  Medications: IV propofol administered by anesthesia staff  LVEF 40% with diffuse hypokinesis.  Mitral valve is post repair with centrally directed jet of moderate mitral regurgitation and mild mitral stenosis. There was no reversal of flow in the pulmonary veins. There is moderate tricuspid regurgitation and moderate pulmonary hypertension with RVSP 56 mmHg.  There is no residual ASD or VSD. This TEE was followed by a successful cardioversion.   Complications: No apparent complications Patient did tolerate procedure well.  Terry Dawley, MD, [facility] 12/07/2018, 5:24 PM      Cardioversion Note  Terry Golden [id number] 09/27/1965  Procedure: DC Cardioversion Indications: atrial flutter  Procedure Details Consent: Obtained Time Out: Verified patient identification, verified procedure, site/side was marked, verified correct patient position, special equipment/implants available, Radiology Safety Procedures followed,  medications/allergies/relevent history reviewed, required imaging and test results available.  Performed  The patient has been on adequate anticoagulation.  The patient received IV propofol for sedation.  Synchronous cardioversion was performed at 120 joules.  The cardioversion was successful.  Complications: No apparent complications Patient did tolerate procedure well.  Terry Dawley, MD, [facility] 12/07/2018, 5:25 PM

## 2018-12-07 NOTE — Progress Notes (Deleted)
Entered in error

## 2018-12-07 NOTE — Interval H&P Note (Signed)
History and Physical Interval Note:  12/07/2018 12:25 PM  Terry Golden.  has presented today for surgery, with the diagnosis of aflutter.  The various methods of treatment have been discussed with the patient and family. After consideration of risks, benefits and other options for treatment, the patient has consented to  Procedure(s): TRANSESOPHAGEAL ECHOCARDIOGRAM (TEE) (N/A) CARDIOVERSION (N/A) as a surgical intervention.  The patient's history has been reviewed, patient examined, no change in status, stable for surgery.  I have reviewed the patient's chart and labs.  Questions were answered to the patient's satisfaction.     [name]

## 2018-12-07 NOTE — Progress Notes (Signed)
Progress Note  Patient Name: Terry Golden. Date of Encounter: 12/07/2018  Primary Cardiologist: [name], MD   Subjective   Feels great. Eager to go home.  Inpatient Medications    Scheduled Meds: . apixaban  5 mg Oral BID  . lisinopril  5 mg Oral Daily  . nicotine  7 mg Transdermal Daily   Continuous Infusions: . sodium chloride 75 mL/hr at 12/07/18 0810  . diltiazem (CARDIZEM) infusion 15 mg/hr (12/07/18 0404)   PRN Meds: acetaminophen **OR** acetaminophen, bisacodyl, HYDROcodone-acetaminophen, ondansetron **OR** ondansetron (ZOFRAN) IV, senna-docusate   Vital Signs    Vitals:   12/06/18 0500 12/06/18 1338 12/06/18 2140 12/07/18 0640  BP:  (!) 143/100 (!) 150/98 133/84  Pulse:  77 63 61  Resp:  16 16 16   Temp:  98.2 F (36.8 C) 98.6 F (37 C) (!) 97.4 F (36.3 C)  TempSrc:  Oral Oral Oral  SpO2:  94% 94% 97%  Weight: 65 kg   65.7 kg  Height:        Intake/Output Summary (Last 24 hours) at 12/07/2018 0913 Last data filed at 12/07/2018 0640 Gross per 24 hour  Intake 1993.69 ml  Output 0 ml  Net 1993.69 ml   Last 3 Weights 12/07/2018 12/06/2018 12/06/2018  Weight (lbs) 144 lb 13.5 oz 143 lb 143 lb 4.8 oz  Weight (kg) 65.7 kg 64.864 kg 65 kg     Telemetry    A-flutter with variable block- Personally Reviewed  ECG    No new reading - Personally Reviewed  Physical Exam   GEN: No acute distress.   Neck: No JVD Cardiac: iRRR, 4/6 systolic murmurs, rubs, or gallops.  Respiratory: Clear to auscultation bilaterally. GI: Soft, nontender, non-distended  MS: No edema; No deformity. Neuro:  Nonfocal  Psych: Normal affect   Labs    Chemistry Recent Labs  Lab 12/05/18 0515 12/06/18 0549 12/07/18 0526  NA 137 137 139  K 3.9 3.8 3.7  CL 111 108 111  CO2 21* 20* 21*  GLUCOSE 100* 163* 105*  BUN 17 11 9   CREATININE 1.07 1.01 0.96  CALCIUM 8.2* 8.7* 8.6*  PROT 6.6 6.8 6.4*  ALBUMIN 3.6 3.7 3.5  AST 18 28 27   ALT 21 29 35  ALKPHOS 56 59 53   BILITOT 0.8 0.6 0.8  GFRNONAA >60 >60 >60  GFRAA >60 >60 >60  ANIONGAP 5 9 7      Hematology Recent Labs  Lab 12/05/18 0515 12/06/18 0549 12/07/18 0526  WBC 6.3 9.1 7.8  RBC 4.14* 4.14* 4.14*  HGB 13.3 13.8 13.2  HCT 41.4 41.3 41.0  MCV 100.0 99.8 99.0  MCH 32.1 33.3 31.9  MCHC 32.1 33.4 32.2  RDW 12.6 12.7 12.6  PLT 208 201 196    Cardiac Enzymes Recent Labs  Lab 12/04/18 0859 12/04/18 1517 12/04/18 2119  TROPONINI <0.03 <0.03 <0.03   No results for input(s): TROPIPOC in the last 168 hours.   BNP Recent Labs  Lab 12/04/18 0859  BNP 240.6*     DDimer No results for input(s): DDIMER in the last 168 hours.   Radiology    No results found.  Cardiac Studies   Echocardiogram 03/11/2017 Study Conclusions  - Left ventricle: S/P VSD closure with no residual shunt by colorflow doppler. The cavity size was normal. Systolic function was normal. The estimated ejection fraction was in the range of 55% to 60%. Wall motion was normal; there were no regional wall motion abnormalities. Features are consistent  with a pseudonormal left ventricular filling pattern, with concomitant abnormal relaxation and increased filling pressure (grade 2 diastolic dysfunction). Doppler parameters are consistent with high ventricular filling pressure. - Mitral valve: Diffuse thickening and calcification of the anterior leaflet, with moderate involvement of chords, consistent with rheumatic disease. The findings are consistent with mild to moderate stenosis. There was mild to moderate regurgitation. Pressure half-time: 203 ms. Mean gradient (D): 4 mm Hg. Valve area by pressure half-time: 1.22 cm^2. - Left atrium: The atrium was mildly dilated. - Right ventricle: The cavity size was mildly dilated. Wall thickness was normal. - Atrial septum: S/P ASD closure with no residual defect by colorflow doppler. - Tricuspid valve: There was trivial regurgitation.  - Pulmonic valve: There was trivial regurgitation.   Patient Profile     53 y.o. male with h/o ASD repair and mitral valve repair in 03/1999. He had acath was 01/1999 as preop assessment which showed normal coronaries. He was admitted from 12/13-12/14/16 with worsening SOB, lightheadedness, presyncope, and chest tightness. Due to persistent chest pain, he had a cath in 09/2015. That showed nonobstructive CAD.   Assessment & Plan    1. Atrial flutter with variable block -Pt has long history of Palpitations. Monitor in 11/2017 showed PVCs, PACs, brief atrial tachycardia.  -CHA2DS2/VAS Stroke Risk Score is 2 (HTN, possible also CHF)  -Trickle rate controlled by Cardizem drip but he remains in atrial flutter - started on eliquis 5 mg po BID however new echocardiogram shows moderate to severe mitral regurgitation so eventually he will need to be switched to warfarin. - TEE with cardioversion today to rule out clot and cardiovert.  This will also allow me to evaluate anatomy of his repaired mitral valve and mechanism of regurgitation to evaluate potential strategy for treatment -MitraClip versus mitral valve repair/replacement, we need to evaluate degree of mitral stenosis that might be a limiting factor for MitraClip. - he remains in a-flutter with 4:1 block and Vr in 60' - I will switch iv cardizem to PO cardizem 240 mg CD daily post procedure as he is NPO  2. H/O RHD -As above, the patient was originally seen by Dr. 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  He underwent mitral valve repair and closure of ASD and VSD 2020 at [facility].  2. Acute diastolic CHF -Pt has a history of atypical chest pain. - BNP was elevated at 240.6, troponins negative X3. CXR showed borderline mild cardiomegaly with mild pulmonary edema, suggesting mild congestive  heart failure.  - diuresed well overnight with weight down 3 kg, now appears euvolemic  3. Hypertension -Not previously treated. Elevated on admission and likely leading to mild pulmonary edema.  -As above will add CCB for MAT. Stop amlodipine. Can consider addition of HCTZ if BP still not well controlled with diltiazem or verapamil.   -Needs close follow up outpatient to continue to work on BP control. -I will add lisinopril 5 mg daily.  4. S/P ASD and mitral valve repair in 03/1999 -Last echo in 03/11/2017 showed normal LV function and stable mild-mod mitral stenosis and regurgitation.   5. Tobacco use -Advise smoking cessation. He is down to 3 cigarettes per day from 1-2 PPD and trying to quit altogether.   Anticipated discharge today after TEE with cardioversion  For questions or updates, please contact [place] Please consult [email or web address] for contact info  under     Signed, [name], MD  12/07/2018, 9:13 AM

## 2018-12-07 NOTE — Anesthesia Preprocedure Evaluation (Addendum)
Anesthesia Evaluation  Patient identified by MRN, date of birth, ID band Patient awake    Reviewed: Allergy & Precautions, NPO status , Patient's Chart, lab work & pertinent test results  Airway Mallampati: III  TM Distance: >3 FB Neck ROM: Full  Mouth opening: Limited Mouth Opening  Dental no notable dental hx. (+) Missing, Dental Advisory Given, Poor Dentition,    Pulmonary shortness of breath, Current Smoker,    Pulmonary exam normal breath sounds clear to auscultation       Cardiovascular Normal cardiovascular exam+ dysrhythmias (on diltiazem gtt) Atrial Fibrillation + Valvular Problems/Murmurs (VSD & mitral valve disease s/p repair 2000)  Rhythm:Irregular Rate:Tachycardia  LHC 2017 Mid LAD lesion, 20% stenosed. Minimal, nonobstructive CAD. The left ventricular systolic function is normal. No interventricular shunt noted. Normal right heart pressures. PA sat 66%. CO 4.1 L/min. CI 2.2.  TTE 2020 EF 40-45%, mod to severe MR, mild MS   Neuro/Psych negative neurological ROS  negative psych ROS   GI/Hepatic negative GI ROS, Neg liver ROS,   Endo/Other  negative endocrine ROS  Renal/GU negative Renal ROS  negative genitourinary   Musculoskeletal negative musculoskeletal ROS (+)   Abdominal   Peds negative pediatric ROS (+)  Hematology negative hematology ROS (+)   Anesthesia Other Findings   Reproductive/Obstetrics negative OB ROS                            Anesthesia Physical Anesthesia Plan  ASA: III  Anesthesia Plan: MAC   Post-op Pain Management:    Induction: Intravenous  PONV Risk Score and Plan: 1 and Propofol infusion, Treatment may vary due to age or medical condition and Midazolam  Airway Management Planned: Simple Face Mask and Natural Airway  Additional Equipment:   Intra-op Plan:   Post-operative Plan:   Informed Consent: I have reviewed the patients History  and Physical, chart, labs and discussed the procedure including the risks, benefits and alternatives for the proposed anesthesia with the patient or authorized representative who has indicated his/her understanding and acceptance.     Dental advisory given  Plan Discussed with: CRNA  Anesthesia Plan Comments:         Anesthesia Quick Evaluation

## 2018-12-07 NOTE — H&P (View-Only) (Signed)
Progress Note  Patient Name: Terry Golden. Date of Encounter: 12/07/2018  Primary Cardiologist: [name], MD   Subjective   Feels great. Eager to go home.  Inpatient Medications    Scheduled Meds: . apixaban  5 mg Oral BID  . lisinopril  5 mg Oral Daily  . nicotine  7 mg Transdermal Daily   Continuous Infusions: . sodium chloride 75 mL/hr at 12/07/18 0810  . diltiazem (CARDIZEM) infusion 15 mg/hr (12/07/18 0404)   PRN Meds: acetaminophen **OR** acetaminophen, bisacodyl, HYDROcodone-acetaminophen, ondansetron **OR** ondansetron (ZOFRAN) IV, senna-docusate   Vital Signs    Vitals:   12/06/18 0500 12/06/18 1338 12/06/18 2140 12/07/18 0640  BP:  (!) 143/100 (!) 150/98 133/84  Pulse:  77 63 61  Resp:  16 16 16   Temp:  98.2 F (36.8 C) 98.6 F (37 C) (!) 97.4 F (36.3 C)  TempSrc:  Oral Oral Oral  SpO2:  94% 94% 97%  Weight: 65 kg   65.7 kg  Height:        Intake/Output Summary (Last 24 hours) at 12/07/2018 0913 Last data filed at 12/07/2018 0640 Gross per 24 hour  Intake 1993.69 ml  Output 0 ml  Net 1993.69 ml   Last 3 Weights 12/07/2018 12/06/2018 12/06/2018  Weight (lbs) 144 lb 13.5 oz 143 lb 143 lb 4.8 oz  Weight (kg) 65.7 kg 64.864 kg 65 kg     Telemetry    A-flutter with variable block- Personally Reviewed  ECG    No new reading - Personally Reviewed  Physical Exam   GEN: No acute distress.   Neck: No JVD Cardiac: iRRR, 4/6 systolic murmurs, rubs, or gallops.  Respiratory: Clear to auscultation bilaterally. GI: Soft, nontender, non-distended  MS: No edema; No deformity. Neuro:  Nonfocal  Psych: Normal affect   Labs    Chemistry Recent Labs  Lab 12/05/18 0515 12/06/18 0549 12/07/18 0526  NA 137 137 139  K 3.9 3.8 3.7  CL 111 108 111  CO2 21* 20* 21*  GLUCOSE 100* 163* 105*  BUN 17 11 9   CREATININE 1.07 1.01 0.96  CALCIUM 8.2* 8.7* 8.6*  PROT 6.6 6.8 6.4*  ALBUMIN 3.6 3.7 3.5  AST 18 28 27   ALT 21 29 35  ALKPHOS 56 59 53   BILITOT 0.8 0.6 0.8  GFRNONAA >60 >60 >60  GFRAA >60 >60 >60  ANIONGAP 5 9 7      Hematology Recent Labs  Lab 12/05/18 0515 12/06/18 0549 12/07/18 0526  WBC 6.3 9.1 7.8  RBC 4.14* 4.14* 4.14*  HGB 13.3 13.8 13.2  HCT 41.4 41.3 41.0  MCV 100.0 99.8 99.0  MCH 32.1 33.3 31.9  MCHC 32.1 33.4 32.2  RDW 12.6 12.7 12.6  PLT 208 201 196    Cardiac Enzymes Recent Labs  Lab 12/04/18 0859 12/04/18 1517 12/04/18 2119  TROPONINI <0.03 <0.03 <0.03   No results for input(s): TROPIPOC in the last 168 hours.   BNP Recent Labs  Lab 12/04/18 0859  BNP 240.6*     DDimer No results for input(s): DDIMER in the last 168 hours.   Radiology    No results found.  Cardiac Studies   Echocardiogram 03/11/2017 Study Conclusions  - Left ventricle: S/P VSD closure with no residual shunt by colorflow doppler. The cavity size was normal. Systolic function was normal. The estimated ejection fraction was in the range of 55% to 60%. Wall motion was normal; there were no regional wall motion abnormalities. Features are consistent  with a pseudonormal left ventricular filling pattern, with concomitant abnormal relaxation and increased filling pressure (grade 2 diastolic dysfunction). Doppler parameters are consistent with high ventricular filling pressure. - Mitral valve: Diffuse thickening and calcification of the anterior leaflet, with moderate involvement of chords, consistent with rheumatic disease. The findings are consistent with mild to moderate stenosis. There was mild to moderate regurgitation. Pressure half-time: 203 ms. Mean gradient (D): 4 mm Hg. Valve area by pressure half-time: 1.22 cm^2. - Left atrium: The atrium was mildly dilated. - Right ventricle: The cavity size was mildly dilated. Wall thickness was normal. - Atrial septum: S/P ASD closure with no residual defect by colorflow doppler. - Tricuspid valve: There was trivial regurgitation.  - Pulmonic valve: There was trivial regurgitation.   Patient Profile     53 y.o. male with h/o ASD repair and mitral valve repair in 03/1999. He had acath was 01/1999 as preop assessment which showed normal coronaries. He was admitted from 12/13-12/14/16 with worsening SOB, lightheadedness, presyncope, and chest tightness. Due to persistent chest pain, he had a cath in 09/2015. That showed nonobstructive CAD.   Assessment & Plan    1. Atrial flutter with variable block -Pt has long history of Palpitations. Monitor in 11/2017 showed PVCs, PACs, brief atrial tachycardia.  -CHA2DS2/VAS Stroke Risk Score is 2 (HTN, possible also CHF)  -Trickle rate controlled by Cardizem drip but he remains in atrial flutter - started on eliquis 5 mg po BID however new echocardiogram shows moderate to severe mitral regurgitation so eventually he will need to be switched to warfarin. - TEE with cardioversion today to rule out clot and cardiovert.  This will also allow me to evaluate anatomy of his repaired mitral valve and mechanism of regurgitation to evaluate potential strategy for treatment -MitraClip versus mitral valve repair/replacement, we need to evaluate degree of mitral stenosis that might be a limiting factor for MitraClip. - he remains in a-flutter with 4:1 block and Vr in 60' - I will switch iv cardizem to PO cardizem 240 mg CD daily post procedure as he is NPO  2. H/O RHD -As above, the patient was originally seen by Dr. 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  He underwent mitral valve repair and closure of ASD and VSD 2020 at [facility].  2. Acute diastolic CHF -Pt has a history of atypical chest pain. - BNP was elevated at 240.6, troponins negative X3. CXR showed borderline mild cardiomegaly with mild pulmonary edema, suggesting mild congestive  heart failure.  - diuresed well overnight with weight down 3 kg, now appears euvolemic  3. Hypertension -Not previously treated. Elevated on admission and likely leading to mild pulmonary edema.  -As above will add CCB for MAT. Stop amlodipine. Can consider addition of HCTZ if BP still not well controlled with diltiazem or verapamil.   -Needs close follow up outpatient to continue to work on BP control. -I will add lisinopril 5 mg daily.  4. S/P ASD and mitral valve repair in 03/1999 -Last echo in 03/11/2017 showed normal LV function and stable mild-mod mitral stenosis and regurgitation.   5. Tobacco use -Advise smoking cessation. He is down to 3 cigarettes per day from 1-2 PPD and trying to quit altogether.   Anticipated discharge today after TEE with cardioversion  For questions or updates, please contact [place] Please consult [email or web address] for contact info  under     Signed, [name], MD  12/07/2018, 9:13 AM

## 2018-12-07 NOTE — Anesthesia Postprocedure Evaluation (Signed)
Anesthesia Post Note  Patient: Shamarcus Hoheisel.  Procedure(s) Performed: TRANSESOPHAGEAL ECHOCARDIOGRAM (TEE) (N/A ) CARDIOVERSION (N/A )     Patient location during evaluation: Endoscopy Anesthesia Type: MAC Level of consciousness: awake and alert Pain management: pain level controlled Vital Signs Assessment: post-procedure vital signs reviewed and stable Respiratory status: spontaneous breathing, nonlabored ventilation, respiratory function stable and patient connected to nasal cannula oxygen Cardiovascular status: blood pressure returned to baseline and stable Postop Assessment: no apparent nausea or vomiting Anesthetic complications: no    Last Vitals:  Vitals:   12/07/18 1450 12/07/18 1537  BP: 124/76 136/89  Pulse: 67 65  Resp: 16 18  Temp:  36.6 C  SpO2: 95% 98%    Last Pain:  Vitals:   12/07/18 1537  TempSrc: Oral  PainSc:                  Chelsey L Woodrum

## 2018-12-07 NOTE — Discharge Summary (Signed)
Physician Discharge Summary  Terry Golden. VHQ:[id number] DOB: 1966-07-11 DOA: 12/04/2018  PCP: Bernerd Limbo, MD  Admit date: 12/04/2018 Discharge date: 12/07/2018  Admitted From: Home Disposition: Home  Recommendations for Outpatient Follow-up:  1. Follow up with PCP/cardiology in 1-2 weeks  Home Health: No Equipment/Devices: None  Discharge Condition: Stable CODE STATUS: Code Diet recommendation: cardiac  Brief/Interim Summary: Brief narrative/Hospital course As per HPI:  53 y.o.malewith medical history significant ofcongenital heart disease status post valvular replacement surgery back in 2000, tobacco use came to the ED on 12/04/2018 with complaints of lightheadedness, palpitation, shortness of breath and atypical chest pain that woke him up early in the morning. He noticed his heartbeat was getting very irregular. Previously he has been seen by Dr. [name] from cardiology. Continues to smoke tobacco daily.  In the ER patient was noted to be in atrial fibrillation with RVR with some chest discomfort.  Patient was admitted under hospitalist service and cardiology was consulted.  Since patient's heart rate was controlled so no rate control medications were added.  He was started on weight-based Lovenox.  He was started on amlodipine for blood pressure control.  He remained in atrial fibrillation and cardiology was consulted who had started the patient on Cardizem drip and amlodipine was discontinued and Lovenox was switched to Eliquis.  Patient has been symptom-free and has been requesting to be sent home. Cardiology perfgormed TEE cardioversion 5/6. Post cardioversion patient was doing well, in sinus rhythm.  I discussed Dr. Meda Coffee from cardiology and per recommendations okay with discharge home on Eliquis ( 30 day free coupon provided by CM) and Cardizem and she will arrange for outpatient follow-up.  Issues being addressed are as below.  Atypical chest pain ACS ruled out with  negative serial troponin.  Echo showed hypokinesis of left ventricle with mild apical inferior wall and inferior septal wall with left ventricular ejection fraction of 40 to 45%.  TSH negative.  Had nonobstructive CAD on left heart cath 2017.  Cardiology plan to obtain TEE.  New onset AF fibrillation/a flutter with RVR and now with variable block.  Patient remains in a flutter today with 4:1 block, he is initiated on anticoagulation with Eliquis, was on Cardizem drip.  Cardiology planned for TEE cardioversion 4/6 and post TEE DCCR he was in NSR.per cardio he will go home on Eliquis and Cardizem CD 240 mg.  Eventually he may need to go on Coumadin, and he is going to follow-up with cardiology as outpatient.  Acute diastolic CHF, BNP of 413, chest x-ray with borderline mild cardiomegaly and mild pulmonary edema.  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  Hypertension not previously treated.  Initially on lisinopril 5 mg, stopped amlodipine.  On CCB for A. fib.  CAN add HCTZ if BP still not well controlled.  Status post ASD/mitral valve repair August/2000  Tobacco abuse advised smoking cessation.   Discharge Diagnoses:  Principal Problem:   Atrial fibrillation with RVR (HCC) Active Problems:   ASD (atrial septal defect)   VSD (ventricular septal defect)   Mitral valve disease   Congenital heart disease   Shortness of breath   Pain in the chest   Atypical atrial flutter (HCC)   Acute diastolic CHF (congestive heart  failure), NYHA class 3 ([place])    Discharge Instructions  Discharge Instructions    (HEART FAILURE PATIENTS) Call MD:  Anytime you have any of the following symptoms: 1) 3 pound weight gain in 24 hours or 5 pounds in 1 week 2)  shortness of breath, with or without a dry hacking cough 3) swelling in the hands, feet or stomach 4) if you have to sleep on extra pillows at night in order to breathe.   Complete by:  As directed    Diet - low sodium heart healthy   Complete by:  As directed    Discharge instructions   Complete by:  As directed    Please call call MD or return to ER for similar recurring problem, nausea/vomiting, uncontrolled pain, abdominal pain chest pain, shortness of breath, fever. Please follow-up your doctor as instructed in a week time and call the office for appointment. Please avoid alcohol, smoking, or any other illicit substance.   Increase activity slowly   Complete by:  As directed      Allergies as of 12/07/2018      Reactions   Tensilon [edrophonium] Other (See Comments)   Blacked out       Medication List    STOP taking these medications   aspirin 325 MG tablet     TAKE these medications   apixaban 5 MG Tabs tablet Commonly known as:  ELIQUIS Take 1 tablet (5 mg total) by mouth 2 (two) times daily for 30 days.   diltiazem 240 MG 24 hr capsule Commonly known as:  CARDIZEM CD Take 1 capsule (240 mg total) by mouth daily for 30 days. Start taking on:  Dec 08, 2018   lisinopril 5 MG tablet Commonly known as:  ZESTRIL Take 1 tablet (5 mg total) by mouth daily for 30 days. Start taking on:  Dec 08, 2018      Follow-up Information    Richardson Dopp T, [place] Follow up on 12/21/2018.   Specialties:  Cardiology, Physician Assistant Why:  Your follow-up visit will be on 12/21/2018 at 3:15 PM with Richardson Dopp, PA Contact information: [address] [place] [address] [phone]          Allergies  Allergen Reactions  . Tensilon [Edrophonium] Other (See Comments)    Blacked out     Consultations:  Cardiology   Procedures/Studies: Dg Chest 2 View  Result Date: 12/04/2018 CLINICAL DATA:  Chest pain and dyspnea EXAM: CHEST - 2 VIEW COMPARISON:   07/16/2015 chest radiograph. FINDINGS: Intact sternotomy wires. Stable cardiomediastinal silhouette with borderline mild cardiomegaly. No pneumothorax. No segment pleural effusion. Mild pulmonary edema. IMPRESSION: Borderline mild cardiomegaly with mild pulmonary edema, suggesting mild congestive heart failure. Electronically Signed   By: Ilona Sorrel M.D.   On: 12/04/2018 10:11  TEE/DCCV  Subjective: Resting well this morning waiting for TEE cardioversion.  Interested to go home after the procedure.  Discharge Exam: Vitals:   12/07/18 1450 12/07/18 1537  BP: 124/76 136/89  Pulse: 67 65  Resp: 16 18  Temp:  97.9 F (36.6 C)  SpO2: 95% 98%   Vitals:   12/07/18 1442 12/07/18 1446 12/07/18 1450 12/07/18 1537  BP: 123/74 122/70 124/76 136/89  Pulse: 65 63 67 65  Resp:  16 16 18   Temp:  97.9 F (36.6 C)  97.9 F (36.6 C)  TempSrc:  Oral  Oral  SpO2: 100% 96% 95% 98%  Weight:      Height:  General: Pt is alert, awake, not in acute distress Cardiovascular: RRR, S1/S2 +, no rubs, no gallops Respiratory: CTA bilaterally, no wheezing, no rhonchi Abdominal: Soft, NT, ND, bowel sounds + Extremities: no edema, no cyanosis   The results of significant diagnostics from this hospitalization (including imaging, microbiology, ancillary and laboratory) are listed below for reference.     Microbiology: Recent Results (from the past 240 hour(s))  SARS Coronavirus 2 [facility] order, Performed in [place] hospital lab)     Status: None   Collection Time: 12/04/18 12:09 PM  Result Value Ref Range Status   SARS Coronavirus 2 NEGATIVE NEGATIVE Final    Comment: (NOTE) If result is NEGATIVE SARS-CoV-2 target nucleic acids are NOT DETECTED. The SARS-CoV-2 RNA is generally detectable in upper and lower  respiratory specimens during the acute phase of infection. The lowest  concentration of SARS-CoV-2 viral copies this assay can detect is 250  copies / mL. A negative result does not  preclude SARS-CoV-2 infection  and should not be used as the sole basis for treatment or other  patient management decisions.  A negative result may occur with  improper specimen collection / handling, submission of specimen other  than nasopharyngeal swab, presence of viral mutation(s) within the  areas targeted by this assay, and inadequate number of viral copies  (<250 copies / mL). A negative result must be combined with clinical  observations, patient history, and epidemiological information. If result is POSITIVE SARS-CoV-2 target nucleic acids are DETECTED. The SARS-CoV-2 RNA is generally detectable in upper and lower  respiratory specimens dur ing the acute phase of infection.  Positive  results are indicative of active infection with SARS-CoV-2.  Clinical  correlation with patient history and other diagnostic information is  necessary to determine patient infection status.  Positive results do  not rule out bacterial infection or co-infection with other viruses. If result is PRESUMPTIVE POSTIVE SARS-CoV-2 nucleic acids MAY BE PRESENT.   A presumptive positive result was obtained on the submitted specimen  and confirmed on repeat testing.  While 2019 novel coronavirus  (SARS-CoV-2) nucleic acids may be present in the submitted sample  additional confirmatory testing may be necessary for epidemiological  and / or clinical management purposes  to differentiate between  SARS-CoV-2 and other Sarbecovirus currently known to infect humans.  If clinically indicated additional testing with an alternate test  methodology [phone]) is advised. The SARS-CoV-2 RNA is generally  detectable in upper and lower respiratory sp ecimens during the acute  phase of infection. The expected result is Negative. Fact Sheet for Patients:  StrictlyIdeas.no Fact Sheet for Healthcare Providers: BankingDealers.co.za This test is not yet approved or cleared by  the [place] FDA and has been authorized for detection and/or diagnosis of SARS-CoV-2 by FDA under an Emergency Use Authorization (EUA).  This EUA will remain in effect (meaning this test can be used) for the duration of the COVID-19 declaration under Section 564(b)(1) of the Act, 21 U.S.C. section 360bbb-3(b)(1), unless the authorization is terminated or revoked sooner. Performed at [facility], [place] [address] [place], [place] [address]      Labs: BNP (last 3 results) Recent Labs    12/04/18 0859  BNP 408.1*   Basic Metabolic Panel: Recent Labs  Lab 12/04/18 0859 12/05/18 0515 12/06/18 0549 12/07/18 0526  NA 141 137 137 139  K 3.7 3.9 3.8 3.7  CL 112* 111 108 111  CO2 22 21* 20* 21*  GLUCOSE 110* 100* 163* 105*  BUN 19 17 11 9   CREATININE 1.04 1.07 1.01 0.96  CALCIUM 9.0 8.2* 8.7* 8.6*   Liver Function Tests: Recent Labs  Lab 12/05/18 0515 12/06/18 0549 12/07/18 0526  AST 18 28 27   ALT 21 29 35  ALKPHOS 56 59 53  BILITOT 0.8 0.6 0.8  PROT 6.6 6.8 6.4*  ALBUMIN 3.6 3.7 3.5   No results for input(s): LIPASE, AMYLASE in the last 168 hours. No results for input(s): AMMONIA in the last 168 hours. CBC: Recent Labs  Lab 12/04/18 0859 12/05/18 0515 12/06/18 0549 12/07/18 0526  WBC 7.5 6.3 9.1 7.8  NEUTROABS  --   --  6.8 4.9  HGB 13.8 13.3 13.8 13.2  HCT 42.5 41.4 41.3 41.0  MCV 101.2* 100.0 99.8 99.0  PLT 202 208 201 196   Cardiac Enzymes: Recent Labs  Lab 12/04/18 0859 12/04/18 1517 12/04/18 2119  TROPONINI <0.03 <0.03 <0.03   BNP: Invalid input(s): POCBNP CBG: Recent Labs  Lab 12/05/18 0744 12/06/18 0754 12/07/18 0749  GLUCAP 84 97 96   D-Dimer No results for input(s): DDIMER in the last 72 hours. Hgb A1c Recent Labs    12/04/18 2119  HGBA1C 5.1   Lipid Profile No results for input(s): CHOL, HDL, LDLCALC, TRIG, CHOLHDL, LDLDIRECT in the last 72 hours. Thyroid function studies No results for input(s):  TSH, T4TOTAL, T3FREE, THYROIDAB in the last 72 hours.  Invalid input(s): FREET3 Anemia work up No results for input(s): VITAMINB12, FOLATE, FERRITIN, TIBC, IRON, RETICCTPCT in the last 72 hours. Urinalysis    Component Value Date/Time   COLORURINE LT. YELLOW 05/16/2012 0908   APPEARANCEUR CLEAR 05/16/2012 0908   LABSPEC 1.010 05/16/2012 0908   PHURINE 6.0 05/16/2012 0908   GLUCOSEU NEGATIVE 05/16/2012 0908   HGBUR NEGATIVE 05/16/2012 0908   BILIRUBINUR NEGATIVE 05/16/2012 0908   KETONESUR NEGATIVE 05/16/2012 0908   PROTEINUR NEGATIVE 01/26/2010 0141   UROBILINOGEN 0.2 05/16/2012 0908   NITRITE NEGATIVE 05/16/2012 0908   LEUKOCYTESUR SMALL 05/16/2012 0908   Sepsis Labs Invalid input(s): PROCALCITONIN,  WBC,  LACTICIDVEN Microbiology Recent Results (from the past 240 hour(s))  SARS Coronavirus 2 [facility] order, Performed in [place] hospital lab)     Status: None   Collection Time: 12/04/18 12:09 PM  Result Value Ref Range Status   SARS Coronavirus 2 NEGATIVE NEGATIVE Final    Comment: (NOTE) If result is NEGATIVE SARS-CoV-2 target nucleic acids are NOT DETECTED. The SARS-CoV-2 RNA is generally detectable in upper and lower  respiratory specimens during the acute phase of infection. The lowest  concentration of SARS-CoV-2 viral copies this assay can detect is 250  copies / mL. A negative result does not preclude SARS-CoV-2 infection  and should not be used as the sole basis for treatment or other  patient management decisions.  A negative result may occur with  improper specimen collection / handling, submission of specimen other  than nasopharyngeal swab, presence of viral mutation(s) within the  areas targeted by this assay, and inadequate number of viral copies  (<250 copies / mL). A negative result must be combined with clinical  observations, patient history, and epidemiological information. If result is POSITIVE SARS-CoV-2 target nucleic acids are DETECTED. The  SARS-CoV-2 RNA is generally detectable in upper and lower  respiratory specimens dur ing the acute phase of infection.  Positive  results are indicative of active infection with SARS-CoV-2.  Clinical  correlation with patient history and other diagnostic information is  necessary to determine patient infection status.  Positive results do  not rule out bacterial infection or co-infection with other viruses. If result is PRESUMPTIVE POSTIVE SARS-CoV-2 nucleic acids MAY BE PRESENT.   A presumptive positive result was obtained on the submitted specimen  and confirmed on repeat testing.  While 2019 novel coronavirus  (SARS-CoV-2) nucleic acids may be present in the submitted sample  additional confirmatory testing may be necessary for epidemiological  and / or clinical management purposes  to differentiate between  SARS-CoV-2 and other Sarbecovirus currently known to infect humans.  If clinically indicated additional testing with an alternate test  methodology [phone]) is advised. The SARS-CoV-2 RNA is generally  detectable in upper and lower respiratory sp ecimens during the acute  phase of infection. The expected result is Negative. Fact Sheet for Patients:  StrictlyIdeas.no Fact Sheet for Healthcare Providers: BankingDealers.co.za This test is not yet approved or cleared by the [place] FDA and has been authorized for detection and/or diagnosis of SARS-CoV-2 by FDA under an Emergency Use Authorization (EUA).  This EUA will remain in effect (meaning this test can be used) for the duration of the COVID-19 declaration under Section 564(b)(1) of the Act, 21 U.S.C. section 360bbb-3(b)(1), unless the authorization is terminated or revoked sooner. Performed at [facility], [place] [address] [place], [place] [address]      Time coordinating discharge: 25 minutes  SIGNED:   Antonieta Pert, MD  [facility] 12/07/2018, 4:52 PM  If 7PM-7AM, please contact night-coverage [email or web address]

## 2018-12-07 NOTE — Transfer of Care (Signed)
Immediate Anesthesia Transfer of Care Note  Patient: Terry Golden.  Procedure(s) Performed: TRANSESOPHAGEAL ECHOCARDIOGRAM (TEE) (N/A ) CARDIOVERSION (N/A )  Patient Location: PACU and Endoscopy Unit  Anesthesia Type:MAC  Level of Consciousness: patient cooperative and responds to stimulation  Airway & Oxygen Therapy: Patient Spontanous Breathing and Patient connected to nasal cannula oxygen  Post-op Assessment: Report given to RN and Post -op Vital signs reviewed and stable  Post vital signs: Reviewed and stable  Last Vitals:  Vitals Value Taken Time  BP    Temp    Pulse    Resp    SpO2      Last Pain:  Vitals:   12/07/18 1135  TempSrc: Oral  PainSc: 0-No pain      Patients Stated Pain Goal: 2 (83/33/83 2919)  Complications: No apparent anesthesia complications

## 2018-12-08 ENCOUNTER — Encounter (HOSPITAL_COMMUNITY): Payer: Self-pay | Admitting: Cardiology

## 2018-12-08 ENCOUNTER — Telehealth: Payer: Self-pay | Admitting: Interventional Cardiology

## 2018-12-08 NOTE — Telephone Encounter (Signed)
Patient contacted regarding discharge from [facility] on Wednesday Dec 07, 2018.  Patient understands to follow up virtual visit Terry Golden on Wednesday Dec 21, 2018 3:15 PM Patient understands discharge instructions? yes Patient understands medications and regiment? yes Patient understands to have  all medications to this visit? Yes  Pt states he will take and record weight/BP/HR daily and have these readings available at time of virtual visit.

## 2018-12-08 NOTE — Telephone Encounter (Signed)
New message   Scheduled a TOC appointment per Kathyrn Drown with Richardson Dopp on 12/21/18 at 3:15 pm.

## 2018-12-09 ENCOUNTER — Telehealth: Payer: Self-pay

## 2018-12-09 ENCOUNTER — Telehealth: Payer: Self-pay | Admitting: Interventional Cardiology

## 2018-12-09 NOTE — Telephone Encounter (Signed)
Patient requested to speak with Terry Golden to help him with MyChart

## 2018-12-09 NOTE — Telephone Encounter (Signed)
Spoke with pt regarding appt on 12/12/18. Pt was advise to check vitals prior to appt. Pt question and concerns were address.

## 2018-12-12 ENCOUNTER — Encounter: Payer: Self-pay | Admitting: Internal Medicine

## 2018-12-12 ENCOUNTER — Telehealth (INDEPENDENT_AMBULATORY_CARE_PROVIDER_SITE_OTHER): Payer: Self-pay | Admitting: Internal Medicine

## 2018-12-12 VITALS — BP 150/90 | HR 62 | Ht 69.0 in | Wt 140.0 lb

## 2018-12-12 DIAGNOSIS — Q211 Atrial septal defect, unspecified: Secondary | ICD-10-CM

## 2018-12-12 DIAGNOSIS — I059 Rheumatic mitral valve disease, unspecified: Secondary | ICD-10-CM

## 2018-12-12 DIAGNOSIS — I4892 Unspecified atrial flutter: Secondary | ICD-10-CM

## 2018-12-12 NOTE — Progress Notes (Signed)
Electrophysiology TeleHealth Note   Due to national recommendations of social distancing due to [place] 19, Audio/video telehealth visit is felt to be most appropriate for this patient at this time.  See MyChart message from today for patient consent regarding telehealth for [facility].   Date:  12/12/2018   ID:  Terry Golden., DOB 10-24-1965, MRN [id number]  Location: home   Provider location: [place] Evaluation Performed: New patient consult  PCP:  Bernerd Limbo, MD  Cardiologist:  [name], MD  Electrophysiologist:  Chantella Creech  Chief Complaint:  palpitations  History of Present Illness:    Terry Golden. is a 53 y.o. male who presents via audio/video conferencing for a telehealth visit today.   The patient is referred for new consultation regarding atrial flutter by Dr Dr [name].  The patient has valvular heart disease.   He has a h/o rheumatic fever.  He is s/p ASD repair and mitral valve repair in 2000.  He has done reasonably well since that time. He has developed moderate MR.  He has atriopathy.  He has frequent palpitations.  Prior holter monitor 2019 revealed sinus with PACs, PVCs and runs of tach (longest 11 beats).  More recently, he developed sustained atrial flutter.  He was placed on eliquis and diltiazem.  He has done reasonably well since that time.  He is referred to EP to consider ablation.  Today, he denies symptoms of palpitations, chest pain, shortness of breath, orthopnea, PND, lower extremity edema, claudication, dizziness, presyncope, syncope, bleeding, or neurologic sequela. The patient is tolerating medications without difficulties and is otherwise without complaint today.   he denies symptoms of cough, fevers, chills, or new SOB worrisome for COVID 19.   Past Medical History:  Diagnosis Date  . ASD (atrial septal defect)    large   . Congenital heart disease    ASVD, Cleft Mitral Valve  . 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   right   . Mitral valve disease 03/25/1999   s/p mitral valve repair with commissuroplasty   . Rheumatic fever   . Syncope    1999  . Typical atrial flutter ([place])   . VSD (ventricular septal defect)    small    Past Surgical History:  Procedure Laterality Date  . CARDIAC CATHETERIZATION N/A 09/20/2015   Procedure: Right/Left Heart Cath and Coronary Angiography;  Surgeon: Jettie Booze, MD;  Location: [facility];  Service: Cardiovascular;  Laterality: N/A;  . CARDIAC SURGERY     s/p partial AVSD repair and cleft mitral valve repair with commissuroplasty  . CARDIAC VALVE REPLACEMENT    . CARDIOVERSION N/A 12/07/2018   Procedure: CARDIOVERSION;  Surgeon: Dorothy Spark, MD;  Location: [facility];  Service: Cardiovascular;  Laterality: N/A;  . TEE WITHOUT CARDIOVERSION N/A 12/07/2018   Procedure: TRANSESOPHAGEAL ECHOCARDIOGRAM (TEE);  Surgeon: Dorothy Spark, MD;  Location: [facility] ENDOSCOPY;  Service: Cardiovascular;  Laterality: N/A;    Current Outpatient Medications  Medication Sig Dispense Refill  . apixaban (ELIQUIS) 5 MG TABS tablet Take 1 tablet (5 mg total) by mouth 2 (two) times daily for 30 days. 60 tablet 0  . diltiazem (CARDIZEM CD) 240 MG 24 hr capsule Take 1 capsule (240 mg total) by mouth daily for 30 days. 30 capsule 0  . lisinopril (ZESTRIL) 5 MG tablet Take 1 tablet (5 mg total) by mouth daily for 30 days. 30 tablet 0   No current facility-administered medications for this visit.  Allergies:   Tensilon [edrophonium]   Social History:  The patient  reports that he has been smoking cigarettes. He has been smoking about 0.50 packs per day. He has never used smokeless tobacco. He reports current alcohol use. He reports that he does not use drugs.   Family History:  The patient's  family history includes Alcohol abuse in an other family member; Arthritis in some other family members; Diabetes in his sister; Heart attack in his father; Heart disease in his sister  and other family members; Hypertension in an other family member; Kidney disease in an other family member; Mental illness in his sister and another family member; Stroke in his father and another family member.    ROS:  Please see the history of present illness.   All other systems are personally reviewed and negative.    Exam:    Vital Signs:  BP (!) 150/90   Pulse 62   Ht 5\' 9"  (1.753 m)   Wt 140 lb (63.5 kg)   SpO2 94%   BMI 20.67 kg/m    Well appearing, alert and conversant, regular work of breathing,  good skin color Eyes- anicteric, neuro- grossly intact, skin- no apparent rash or lesions or cyanosis, mouth- oral mucosa is pink   Labs/Other Tests and Data Reviewed:    Recent Labs: 12/04/2018: B Natriuretic Peptide 240.6; TSH 1.391 12/07/2018: ALT 35; BUN 9; Creatinine, Ser 0.96; Hemoglobin 13.2; Platelets 196; Potassium 3.7; Sodium 139   Wt Readings from Last 3 Encounters:  12/12/18 140 lb (63.5 kg)  12/07/18 144 lb 13.5 oz (65.7 kg)  12/06/18 143 lb (64.9 kg)     Other studies personally reviewed: Additional studies/ records that were reviewed today include: recent hospital records, TEE, Dr Hassell Done records  Review of the above records today demonstrates: as above    ASSESSMENT & PLAN:    1.  Atrial flutter Thought ekg appears somewhat typical, he has a h/o rheumatic heart disease with prior ASD repair and MV repair.  I worry that he is at risk for atypical atrial arrhythmias as well. For the short term (in the setting of COVID 19), I would advise medical therapy with diltiazem.  He is also on eliquis. If he continues to have sustained atrial arrhythmias then we should consider AAD therapy vs ablation at that time.  2. COVID screen The patient does not have any symptoms that suggest any further testing/ screening at this time.  Social distancing reinforced today.  Follow-up:  AF clinic in 4 weeks  Patient Risk:  after full review of this patients clinical  status, I feel that they are at moderate risk at this time.   Today, I have spent 20 minutes with the patient with telehealth technology discussing atrial arrhythmias .    SignedThompson Grayer MD, [place] 12/12/2018 11:42 AM   [facility] [address] [place] [address] [phone] (office) [phone] (fax)

## 2018-12-13 ENCOUNTER — Encounter (HOSPITAL_COMMUNITY): Payer: Self-pay

## 2018-12-21 ENCOUNTER — Other Ambulatory Visit: Payer: Self-pay

## 2018-12-21 ENCOUNTER — Encounter: Payer: Self-pay | Admitting: Physician Assistant

## 2018-12-21 ENCOUNTER — Telehealth (INDEPENDENT_AMBULATORY_CARE_PROVIDER_SITE_OTHER): Payer: Self-pay | Admitting: Physician Assistant

## 2018-12-21 VITALS — BP 130/83 | HR 59 | Ht 69.0 in | Wt 144.0 lb

## 2018-12-21 DIAGNOSIS — I059 Rheumatic mitral valve disease, unspecified: Secondary | ICD-10-CM

## 2018-12-21 DIAGNOSIS — Q249 Congenital malformation of heart, unspecified: Secondary | ICD-10-CM

## 2018-12-21 DIAGNOSIS — I4892 Unspecified atrial flutter: Secondary | ICD-10-CM

## 2018-12-21 DIAGNOSIS — I272 Pulmonary hypertension, unspecified: Secondary | ICD-10-CM

## 2018-12-21 DIAGNOSIS — I5022 Chronic systolic (congestive) heart failure: Secondary | ICD-10-CM

## 2018-12-21 DIAGNOSIS — Z7189 Other specified counseling: Secondary | ICD-10-CM

## 2018-12-21 NOTE — Patient Instructions (Signed)
Medication Instructions:   Continue your current medications.  If you need a refill on your cardiac medications before your next appointment, please call your pharmacy.   Lab work:  We will arrange a follow up BMET, CBC at your appointment for your echocardiogram in 6 weeks.  If you have labs (blood work) drawn today and your tests are completely normal, you will receive your results only by: Marland Kitchen MyChart Message (if you have MyChart) OR . A paper copy in the mail If you have any lab test that is abnormal or we need to change your treatment, we will call you to review the results.  Testing/Procedures: We will arrange an echocardiogram in 6 weeks to recheck your heart function (ejection fraction). We will arrange a home sleep study to assess for sleep apnea.    Follow-Up: At [facility], you and your health needs are our priority.  As part of our continuing mission to provide you with exceptional heart care, we have created designated Provider Care Teams.  These Care Teams include your primary Cardiologist (physician) and Advanced Practice Providers (APPs -  Physician Assistants and Nurse Practitioners) who all work together to provide you with the care you need, when you need it. You will need a follow up appointment in 8 weeks.  Please call our office 2 months in advance to schedule this appointment.  You may see [name], MD or one of the following Advanced Practice Providers on your designated Care Team:   [place], PA-C Melina Copa, PA-C . Ermalinda Barrios, PA-C  Any Other Special Instructions Will Be Listed Below (If Applicable).   Weigh daily. If your weight increases by > 3 lbs in one day or you have swelling in your legs or notice you are more short of breath, call our office.

## 2018-12-21 NOTE — Progress Notes (Signed)
Virtual Visit via Video Note   This visit type was conducted due to national recommendations for restrictions regarding the COVID-19 Pandemic (e.g. social distancing) in an effort to limit this patient's exposure and mitigate transmission in our community.  Due to his co-morbid illnesses, this patient is at least at moderate risk for complications without adequate follow up.  This format is felt to be most appropriate for this patient at this time.  All issues noted in this document were discussed and addressed.  A limited physical exam was performed with this format.  Please refer to the patient's chart for his consent to telehealth for [facility].   Date:  12/21/2018   ID:  Terry Golden., DOB 1965-09-24, MRN [id number]  Patient Location: Home Provider Location: Home  PCP:  Bernerd Limbo, MD  Cardiologist:  [name], MD   Electrophysiologist:  [name], MD   Evaluation Performed:  Follow-Up Visit  Chief Complaint:  Hospital follow up - admitted with CHF, AFib/Flutter   History of Present Illness:    Terry Golden. is a 53 y.o. 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  Cardiac catheterization in 2017 demonstrated nonobstructive coronary artery disease.  He was last seen by Dr. [name] in April 2019.  He was admitted 5/3-5/6 with atrial fibrillation/flutter with RVR and acute systolic heart failure.  Lisinopril and calcium channel blocker were added for uncontrolled hypertension.  He was started on anticoagulation and underwent TEE guided cardioversion with restoration of normal sinus rhythm.  Of note, his EF is 40% on TEE with mod MR and mild MS, mod TR, RVSP 56.  There was no residual ASD or VSD.  Reduced EF was felt to be related to tachycardia.  He was referred to EP and saw  Dr. [name] 12/12/2018.  His recommendation is to continue med Rx with Diltiazem for now.  AAD therapy vs ablation can be considered if he continues to have sustained atrial arrhythmias.  Follow up in the AFib Clinic in 1 month has been planned.    Today, he notes he is doing well.  He is still fatigued and has some headaches. But, overall, he is feeling better.  He does not feel like he has had return of Afib.  He has not had chest pain, significant shortness of breath, orthopnea, paroxysmal nocturnal dyspnea, leg swelling, syncope.    The patient does not have symptoms concerning for COVID-19 infection (fever, chills, cough, or new shortness of breath).    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   right   Mitral valve disease 03/25/1999   s/p mitral valve repair with commissuroplasty    Rheumatic fever    Syncope    1999   Typical atrial flutter (HCC)    VSD (ventricular septal defect)    small   Past Surgical History:  Procedure Laterality Date   CARDIAC CATHETERIZATION N/A 09/20/2015   Procedure: Right/Left Heart Cath and Coronary Angiography;  Surgeon: Jettie Booze, MD;  Location: [place] CV [facility];  Service: Cardiovascular;  Laterality: N/A;   CARDIAC SURGERY     s/p partial AVSD repair and cleft mitral valve  repair with commissuroplasty   CARDIAC VALVE REPLACEMENT     CARDIOVERSION N/A 12/07/2018   Procedure: CARDIOVERSION;  Surgeon: Dorothy Spark, MD;  Location: [place];  Service: Cardiovascular;  Laterality: N/A;   TEE WITHOUT CARDIOVERSION N/A 12/07/2018   Procedure: TRANSESOPHAGEAL ECHOCARDIOGRAM (TEE);  Surgeon: Dorothy Spark, MD;  Location: [facility] ENDOSCOPY;  Service: Cardiovascular;  Laterality: N/A;     Current Meds  Medication Sig   apixaban (ELIQUIS) 5 MG TABS tablet Take 1 tablet (5 mg total) by mouth 2 (two)  times daily for 30 days.   diltiazem (CARDIZEM CD) 240 MG 24 hr capsule Take 1 capsule (240 mg total) by mouth daily for 30 days.   lisinopril (ZESTRIL) 5 MG tablet Take 1 tablet (5 mg total) by mouth daily for 30 days.     Allergies:   Tensilon [edrophonium]   Social History   Tobacco Use   Smoking status: Current Every Day Smoker    Packs/day: 0.50    Types: Cigarettes   Smokeless tobacco: Never Used   Tobacco comment: 0.5 ppd; Started smoking as a teenager  Substance Use Topics   Alcohol use: Yes    Alcohol/week: 0.0 standard drinks    Comment: Occasional social   Drug use: No     Family Hx: The patient's family history includes Alcohol abuse in an other family member; Arthritis in some other family members; Diabetes in his sister; Heart attack in his father; Heart disease in his sister and other family members; Hypertension in an other family member; Kidney disease in an other family member; Mental illness in his sister and another family member; Stroke in his father and another family member.  ROS:   Please see the history of present illness.     All other systems reviewed and are negative.   Prior CV studies:   The following studies were reviewed today:  TEE 12/07/2018 EF 35-40 Severe LAE, mod RAE Mod MR, mod TR Mild MS RVSP 56, mod pulmonary HTN No residual ASD/VSD  Echo 12/05/2018 Moderate hypokinesis of the left ventricular, mid-apical inferior and inferoseptal walls EF 40-45 Mildly reduced RV SF; RVSP 39.2 Severe BAE Moderate to severe MR, mild mitral stenosis  Holter 11/26/2017  Normal sinus rhythm with rare PACs and PVCs.  Short runs of atrial tachycardia, longest was 11 beats.  No sustained arrthyhmias.  Echo 03/11/2017 Status post VSD closure with no residual shunt, EF 55-60, normal wall motion, grade 2 diastolic dysfunction, mild to moderate mitral stenosis, mild to moderate mitral regurgitation, mild LAE, status post ASD closure with no  residual defect  Cardiac catheterization 09/20/2015 LAD mid 20 EF 55-65  Labs/Other Tests and Data Reviewed:    EKG:  No ECG reviewed.  Recent Labs: 12/04/2018: B Natriuretic Peptide 240.6; TSH 1.391 12/07/2018: ALT 35; BUN 9; Creatinine, Ser 0.96; Hemoglobin 13.2; Platelets 196; Potassium 3.7; Sodium 139   Recent Lipid Panel Lab Results  Component Value Date/Time   CHOL 161 12/04/2018 08:59 AM   CHOL 176 09/05/2018 10:00 AM   TRIG 53 12/04/2018 08:59 AM   HDL 43 12/04/2018 08:59 AM   HDL 54 09/05/2018 10:00 AM   CHOLHDL 3.7 12/04/2018 08:59 AM   LDLCALC 107 (H) 12/04/2018 08:59 AM   LDLCALC 113 (H) 09/05/2018 10:00 AM    Wt Readings from Last 3 Encounters:  12/21/18 144 lb (65.3 kg)  12/12/18 140 lb (63.5 kg)  12/07/18 144 lb 13.5 oz (65.7 kg)     Objective:  Vital Signs:  BP 130/83    Pulse (!) 59    Ht 5\' 9"  (1.753 m)    Wt 144 lb (65.3 kg)    BMI 21.27 kg/m    VITAL SIGNS:  reviewed GEN:  no acute distress EYES:  EOMI RESPIRATORY:  normal respiratory effort NEURO:  alert and oriented x 3, no obvious focal deficit PSYCH:  normal affect  ASSESSMENT & PLAN:    Chronic systolic heart failure (HCC) EF ~ 40 by Echo and TEE.  This is probably tachycardia mediated.  He is currently doing well with NYHA 2 symptoms. His volume seems to be stable.  He is currently on Diltiazem which was started for rate control.  If his EF does not improve, he will need to be transitioned to a beta-blocker.  Continue ACE inhibitor.  Arrange follow up echocardiogram in 6 weeks to recheck EF, reassess MR and atrial size.    -Arrange echo in 6 weeks  Atrial flutter, unspecified type ([place]) He seems to be maintaining normal sinus rhythm post DCCV.  CHADS2-VASc=2 (HTN, CHF).  Continue calcium channel blocker, Apixaban.  We will arrange a follow up BMET, CBC in 6 weeks.  Mitral valve disorder S/p MV repair.  Mod by TEE.  Symptoms are stable. He will need continued surveillance with  echocardiograms.  Continue SBE prophylaxis.    Congenital heart disease Hx of AVSD repair and MV repair in 2000 at [facility].  Continue SBE prophylaxis.  No residual ASD or VSD on recent TEE.  Pulmonary hypertension, unspecified ([place]) He has a hx of snoring.  With recent episode of AFib and significant atriopathy on echo, will arrange home sleep study to rule out OSA.  COVID-19 Education: The signs and symptoms of COVID-19 were discussed with the patient and how to seek care for testing (follow up with PCP or arrange E-visit).  The importance of social distancing was discussed today.  Time:   Today, I have spent 17 minutes with the patient with telehealth technology discussing the above problems.     Medication Adjustments/Labs and Tests Ordered: Current medicines are reviewed at length with the patient today.  Concerns regarding medicines are outlined above.   Tests Ordered: Orders Placed This Encounter  Procedures   Basic metabolic panel   CBC   ECHOCARDIOGRAM COMPLETE   Home sleep test    Medication Changes: No orders of the defined types were placed in this encounter.   Disposition:  Follow up in 8 week(s)  Signed, Richardson Dopp, PA-C  12/21/2018 5:43 PM    [facility]

## 2019-01-06 ENCOUNTER — Telehealth: Payer: Self-pay

## 2019-01-06 ENCOUNTER — Telehealth (HOSPITAL_COMMUNITY): Payer: Self-pay | Admitting: *Deleted

## 2019-01-06 NOTE — Telephone Encounter (Signed)
COVID-19 Pre-Screening Questions:  . Do you currently have a fever? NO (yes = cancel and refer to pcp for e-visit) . Have you recently travelled on a cruise, internationally, or to [place], [place], [place], [place], [place], or [place], [place] ([place]) ? NO (yes = cancel, stay home, monitor symptoms, and contact pcp or initiate e-visit if symptoms develop) . Have you been in contact with someone that is currently pending confirmation of Covid19 testing or has been confirmed to have the Covid19 virus?  NO (yes = cancel, stay home, away from tested individual, monitor symptoms, and contact pcp or initiate e-visit if symptoms develop) . Are you currently experiencing fatigue or cough? NO (yes = pt should be prepared to have a mask placed at the time of their visit).   . Reiterated no additional visitors. . Arrive no earlier than 15 minutes before appointment time. . Please bring own mask.  Terry Golden 

## 2019-01-06 NOTE — Telephone Encounter (Signed)
    COVID-19 Pre-Screening Questions:  . In the past 7 to 10 days have you had a cough,  shortness of breath, headache, congestion, fever (100 or greater) body aches, chills, sore throat, or sudden loss of taste or sense of smell? . Have you been around anyone with known Covid 19. . Have you been around anyone who is awaiting Covid 19 test results in the past 7 to 10 days? . Have you been around anyone who has been exposed to Covid 19, or has mentioned symptoms of Covid 19 within the past 7 to 10 days?  If you have any concerns/questions about symptoms patients report during screening (either on the phone or at threshold). Contact the provider seeing the patient or DOD for further guidance.  If neither are available contact a member of the leadership team.          Pt answered No to all pre-screening questions [id number] 01/06/2019

## 2019-01-09 ENCOUNTER — Ambulatory Visit (HOSPITAL_COMMUNITY): Payer: Self-pay | Attending: Cardiology

## 2019-01-09 ENCOUNTER — Other Ambulatory Visit: Payer: Self-pay | Admitting: *Deleted

## 2019-01-09 ENCOUNTER — Other Ambulatory Visit: Payer: Self-pay

## 2019-01-09 ENCOUNTER — Telehealth: Payer: Self-pay | Admitting: Interventional Cardiology

## 2019-01-09 DIAGNOSIS — Q249 Congenital malformation of heart, unspecified: Secondary | ICD-10-CM

## 2019-01-09 DIAGNOSIS — I5022 Chronic systolic (congestive) heart failure: Secondary | ICD-10-CM

## 2019-01-09 DIAGNOSIS — I4892 Unspecified atrial flutter: Secondary | ICD-10-CM

## 2019-01-09 DIAGNOSIS — I272 Pulmonary hypertension, unspecified: Secondary | ICD-10-CM

## 2019-01-09 DIAGNOSIS — I059 Rheumatic mitral valve disease, unspecified: Secondary | ICD-10-CM

## 2019-01-09 LAB — CBC
Hematocrit: 37.7 % (ref 37.5–51.0)
Hemoglobin: 12.8 g/dL — ABNORMAL LOW (ref 13.0–17.7)
MCH: 32.6 pg (ref 26.6–33.0)
MCHC: 34 g/dL (ref 31.5–35.7)
MCV: 96 fL (ref 79–97)
Platelets: 231 10*3/uL (ref 150–450)
RBC: 3.93 x10E6/uL — ABNORMAL LOW (ref 4.14–5.80)
RDW: 12.3 % (ref 11.6–15.4)
WBC: 4.9 10*3/uL (ref 3.4–10.8)

## 2019-01-09 LAB — BASIC METABOLIC PANEL
BUN/Creatinine Ratio: 10 (ref 9–20)
BUN: 11 mg/dL (ref 6–24)
CO2: 25 mmol/L (ref 20–29)
Calcium: 9.2 mg/dL (ref 8.7–10.2)
Chloride: 102 mmol/L (ref 96–106)
Creatinine, Ser: 1.08 mg/dL (ref 0.76–1.27)
GFR calc Af Amer: 90 mL/min/{1.73_m2} (ref >59)
GFR calc non Af Amer: 78 mL/min/{1.73_m2} (ref >59)
Glucose: 74 mg/dL (ref 65–99)
Potassium: 3.6 mmol/L (ref 3.5–5.2)
Sodium: 140 mmol/L (ref 134–144)

## 2019-01-09 MED ORDER — LISINOPRIL 5 MG PO TABS: 5.0000 mg | ORAL_TABLET | Freq: Every day | ORAL | 3 refills | Status: DC

## 2019-01-09 MED ORDER — APIXABAN 5 MG PO TABS: 5.0000 mg | ORAL_TABLET | Freq: Two times a day (BID) | ORAL | 1 refills | Status: DC

## 2019-01-09 MED ORDER — DILTIAZEM HCL ER COATED BEADS 240 MG PO CP24: 240.0000 mg | ORAL_CAPSULE | Freq: Every day | ORAL | 3 refills | Status: DC

## 2019-01-09 NOTE — Telephone Encounter (Signed)
Called and instructed patient to continue taking his diltiazem, eliquis, and lisinopril. Refills sent in to preferred pharmacy.

## 2019-01-09 NOTE — Telephone Encounter (Signed)
Pt. Wants to know how long do you need to continue All 3 medications? He wants to know if he just continues them until told to stop? Pt. Requested call back at [phone]

## 2019-01-10 ENCOUNTER — Encounter: Payer: Self-pay | Admitting: Physician Assistant

## 2019-01-12 ENCOUNTER — Telehealth: Payer: Self-pay | Admitting: Interventional Cardiology

## 2019-01-12 NOTE — Telephone Encounter (Signed)
New message   Pt c/o medication issue:  1. Name of Medication:apixaban (ELIQUIS) 5 MG TABS tablet  2. How are you currently taking this medication (dosage and times per day)? Twice daily  3. Are you having a reaction (difficulty breathing--STAT)?n/a  4. What is your medication issue?patient states that this medication is $500.00 patient would like to see if there is a cheaper substitute for this medication. Please call.

## 2019-01-12 NOTE — Telephone Encounter (Signed)
The pt has no insurance so I gave him the option of switching to Coumadin from Eliquis and I briefly explained what involved with Coumadin or he can apply for pt asst through [employer]. He wants to apply for pt asst.  He is aware that we are leaving him a BMS pt asst application and 2 bottles of Eliquis samples in the downstairs lobby of the [employer] office at [facility] in [place] for him to pick up.  He is aware to provide his 2019 proof of income and his 2020 out of pocket expense report from his pharmacy along with his completed application.

## 2019-01-12 NOTE — Telephone Encounter (Signed)
Will route this message to our Prior [place], to see if she could assist this pt with some sort of pt assistance program to afford his eliquis.  Jeani Hawking, is there any chance you could assist this pt with some kind of pt assistance so that he can continue taking his needed Eliquis?   Please advise!

## 2019-01-20 ENCOUNTER — Ambulatory Visit: Payer: Self-pay | Admitting: Surgery

## 2019-01-20 NOTE — H&P (Signed)
History of Present Illness Terry Golden. Zair Borawski MD; 01/20/2019 9:41 AM) The patient is a 53 year old male who presents with a complaint of Mass. Referred by Dr. Coletta Memos for right shoulder mass Cards - [place]  This is a 53 year old male with congenital heart disease with history of mitral valve repair in 2000 as well as repair of atrioventricular septal defect presents with an enlarging mass on his right shoulder. Likely has been present for some time but it recently became larger and more uncomfortable. He gets twinges of pain in the shoulder when he moves. The patient is very lean and this mass is very noticeable. He presents now to discuss excision The patient was recently hospitalized for congestive heart failure. He has several cardiology appointments scheduled in the near future. He is anticoagulated on Eliquis.   Past Surgical History Emeline Gins, [place]; 01/20/2019 9:18 AM) Valve Replacement  Diagnostic Studies History Emeline Gins, [place]; 01/20/2019 9:18 AM) Colonoscopy never  Allergies Emeline Gins, CMA; 01/20/2019 9:20 AM) Tensilon *ANTIMYASTHENIC/CHOLINERGIC AGENTS* Allergies Reconciled  Medication History Emeline Gins, CMA; 01/20/2019 9:19 AM) dilTIAZem HCl ER Coated Beads (240MG  Capsule ER 24HR, Oral) Active. Eliquis (5MG  Tablet, Oral) Active. Lisinopril (5MG  Tablet, Oral) Active. Medications Reconciled  Social History Emeline Gins, [place]; 01/20/2019 9:18 AM) Alcohol use Remotely quit alcohol use. No drug use Tobacco use Current every day smoker.  Family History Emeline Gins, [place]; 01/20/2019 9:18 AM) Heart Disease Father. Heart disease in male family member before age 23 Hypertension Father.  Other Problems Emeline Gins, CMA; 01/20/2019 9:18 AM) Heart murmur     Review of Systems Emeline Gins CMA; 01/20/2019 9:18 AM) General Not Present- Appetite Loss, Chills, Fatigue, Fever, Night Sweats, Weight Gain and Weight  Loss. Skin Not Present- Change in Wart/Mole, Dryness, Hives, Jaundice, New Lesions, Non-Healing Wounds, Rash and Ulcer. HEENT Not Present- Earache, Hearing Loss, Hoarseness, Nose Bleed, Oral Ulcers, Ringing in the Ears, Seasonal Allergies, Sinus Pain, Sore Throat, Visual Disturbances, Wears glasses/contact lenses and Yellow Eyes. Respiratory Not Present- Bloody sputum, Chronic Cough, Difficulty Breathing, Snoring and Wheezing. Breast Not Present- Breast Mass, Breast Pain, Nipple Discharge and Skin Changes. Gastrointestinal Not Present- Abdominal Pain, Bloating, Bloody Stool, Change in Bowel Habits, Chronic diarrhea, Constipation, Difficulty Swallowing, Excessive gas, Gets full quickly at meals, Hemorrhoids, Indigestion, Nausea, Rectal Pain and Vomiting. Male Genitourinary Not Present- Blood in Urine, Change in Urinary Stream, Frequency, Impotence, Nocturia, Painful Urination, Urgency and Urine Leakage.  Vitals Emeline Gins CMA; 01/20/2019 9:18 AM) 01/20/2019 9:18 AM Weight: 147 lb Height: 69in Body Surface Area: 1.81 m Body Mass Index: 21.71 kg/m  Temp.: 98.45F  Pulse: 70 (Regular)  BP: 132/84 (Sitting, Left Arm, Standard)        Physical Exam Rodman Key K. Ventura Leggitt MD; 01/20/2019 9:42 AM)  The physical exam findings are as follows: Note:WDWN in NAD Eyes: Pupils equal, round; sclera anicteric HENT: Oral mucosa moist; good dentition Neck: No masses palpated, no thyromegaly Lungs: CTA bilaterally; normal respiratory effort CV: Regular rate and rhythm; no murmurs; extremities well-perfused with no edema Abd: +bowel sounds, soft, non-tender, no palpable organomegaly; no palpable hernias Posterior right shoulder shows a 13 cm diameter subcutaneous mass. The edges are very well demarcated. The mass is mobile and does not seem fixed to the underlying fascia. No overlying skin changes. Skin: Warm, dry; no sign of jaundice Psychiatric - alert and oriented x 4; calm mood and  affect    Assessment & Plan Rodman Key K. 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 [name] and Dr. [name] Hopefully will be able to hold ELiquis for surgery   Schedule for Surgery - Excision of subcutaneous lipoma - right shoulder. The surgical procedure has been discussed with the patient. Potential risks, benefits, alternative treatments, and expected outcomes have been explained. All of the patient's questions at this time have been answered. The likelihood of reaching the patient's treatment goal is good. The patient understand the proposed surgical procedure and wishes to proceed.  He will need a drain post-op Schedule at [facility] due to cardiac issues   Terry Golden. Georgette Dover, MD, [place] [facility] Beeper [phone]  01/20/2019 9:42 AM

## 2019-01-23 ENCOUNTER — Other Ambulatory Visit: Payer: Self-pay

## 2019-01-23 ENCOUNTER — Ambulatory Visit (HOSPITAL_COMMUNITY)
Admission: RE | Admit: 2019-01-23 | Discharge: 2019-01-23 | Disposition: A | Discharge status: Home / Self Care | Payer: Self-pay | Source: Ambulatory Visit | Attending: Nurse Practitioner | Admitting: Nurse Practitioner

## 2019-01-23 ENCOUNTER — Encounter (HOSPITAL_COMMUNITY): Payer: Self-pay | Admitting: Physician Assistant

## 2019-01-23 VITALS — BP 138/82 | HR 64 | Ht 69.0 in | Wt 146.0 lb

## 2019-01-23 DIAGNOSIS — I110 Hypertensive heart disease with heart failure: Secondary | ICD-10-CM | POA: Insufficient documentation

## 2019-01-23 DIAGNOSIS — Q249 Congenital malformation of heart, unspecified: Secondary | ICD-10-CM | POA: Insufficient documentation

## 2019-01-23 DIAGNOSIS — I4891 Unspecified atrial fibrillation: Secondary | ICD-10-CM | POA: Insufficient documentation

## 2019-01-23 DIAGNOSIS — I4892 Unspecified atrial flutter: Secondary | ICD-10-CM

## 2019-01-23 DIAGNOSIS — Z7901 Long term (current) use of anticoagulants: Secondary | ICD-10-CM | POA: Insufficient documentation

## 2019-01-23 DIAGNOSIS — F17210 Nicotine dependence, cigarettes, uncomplicated: Secondary | ICD-10-CM | POA: Insufficient documentation

## 2019-01-23 DIAGNOSIS — Z79899 Other long term (current) drug therapy: Secondary | ICD-10-CM | POA: Insufficient documentation

## 2019-01-23 NOTE — Progress Notes (Signed)
Primary Care Physician: Bernerd Limbo, MD Primary Cardiologist: Dr [name] Primary Electrophysiologist: Dr [name] Referring Physician: Dr Okey Dupre. is a 53 y.o. 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  Cardiac catheterization in 2017 demonstrated nonobstructive coronary artery disease who presents for follow up in the [facility]. Patient was admitted on 12/04/18 with atrial flutter with RVR and acute CHF. EF was 40% and was felt to be tachycardia mediated. Seen by Dr [name] 12/12/18 who recommend continuing diltiazem.  On follow up today, he reports that he is doing well on diltiazem with decreased palpitations. He has not had any heart racing. No symptoms of fluid overload. He is in SR today.   Today, he denies symptoms of palpitations, chest pain, shortness of breath, orthopnea, PND, lower extremity edema, dizziness, presyncope, syncope, snoring, daytime somnolence, bleeding, or neurologic sequela. The patient is tolerating medications without difficulties and is otherwise without complaint today.    Atrial Fibrillation Risk Factors:  he does not have symptoms or diagnosis of sleep apnea. He does have a history of rheumatic fever. he does not have a history of alcohol use.   he has a BMI of Body mass index is 21.56 kg/m.Marland Kitchen Filed Weights   01/23/19 1034  Weight: 66.2 kg    Family History  Problem Relation Age of Onset  . Diabetes Sister   . Mental illness Sister   . Heart disease Sister   . Arthritis Other   . Hypertension Other   . Alcohol abuse Other   . Heart attack Father   . Stroke Father   . Heart disease Other   . Arthritis Other   . Heart disease Other   . Stroke Other   . Mental  illness Other   . Kidney disease Other      Atrial Fibrillation Management history:  Previous antiarrhythmic drugs: none Previous cardioversions: none Previous ablations: none CHADS2VASC score: 2 Anticoagulation history: Eliquis   Past Medical History:  Diagnosis Date  . ASD (atrial septal defect)    large   . Congenital heart disease    ASVD, Cleft Mitral Valve  . Echocardiogram    Echo 01/2019: EF 45-50, mod basal septal hypertrophy, Gr 2 DD, s/p VSD closure with no residual shunt,s/p MV ring with mod MR, s/p ASD closure with no residual shunt  . 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 Mitral valve disease 03/25/1999   s/p mitral valve repair with commissuroplasty   . Rheumatic fever   . Syncope    1999  . Typical atrial flutter ([place])   . VSD (ventricular septal defect)    small   Past Surgical History:  Procedure Laterality Date  . CARDIAC CATHETERIZATION N/A 09/20/2015   Procedure: Right/Left Heart Cath and Coronary Angiography;  Surgeon: Jettie Booze, MD;  Location: [place] CV LAB;  Service: Cardiovascular;  Laterality: N/A;  . CARDIAC SURGERY     s/p partial AVSD repair and cleft mitral valve repair with commissuroplasty  . CARDIAC VALVE REPLACEMENT    . CARDIOVERSION N/A 12/07/2018   Procedure: CARDIOVERSION;  Surgeon: Dorothy Spark, MD;  Location: [facility] ENDOSCOPY;  Service: Cardiovascular;  Laterality: N/A;  . TEE WITHOUT CARDIOVERSION N/A 12/07/2018   Procedure: TRANSESOPHAGEAL  ECHOCARDIOGRAM (TEE);  Surgeon: Dorothy Spark, MD;  Location: [facility] ENDOSCOPY;  Service: Cardiovascular;  Laterality: N/A;    Current Outpatient Medications  Medication Sig Dispense Refill  . apixaban (ELIQUIS) 5 MG TABS tablet Take 1 tablet (5 mg total) by mouth 2 (two) times daily. 180 tablet 1  . diltiazem (CARDIZEM CD) 240 MG 24 hr capsule Take 1 capsule (240 mg total) by mouth daily. 90 capsule 3  . lisinopril (ZESTRIL) 5 MG tablet Take 1 tablet (5 mg total) by mouth  daily. 90 tablet 3   No current facility-administered medications for this encounter.     Allergies  Allergen Reactions  . Tensilon [Edrophonium] Other (See Comments)    Blacked out     Social History   Socioeconomic History  . Marital status: Divorced    Spouse name: Not on file  . Number of children: 1  . Years of education: 80  . Highest education level: Not on file  Occupational History  . Not on file  Social Needs  . Financial resource strain: Not on file  . Food insecurity    Worry: Not on file    Inability: Not on file  . Transportation needs    Medical: Not on file    Non-medical: Not on file  Tobacco Use  . Smoking status: Current Every Day Smoker    Packs/day: 0.50    Types: Cigarettes  . Smokeless tobacco: Never Used  . Tobacco comment: 0.5 ppd; Started smoking as a teenager  Substance and Sexual Activity  . Alcohol use: Yes    Alcohol/week: 0.0 standard drinks    Comment: Occasional social  . Drug use: No  . Sexual activity: Not on file  Lifestyle  . Physical activity    Days per week: Not on file    Minutes per session: Not on file  . Stress: Not on file  Relationships  . Social Herbalist on phone: Not on file    Gets together: Not on file    Attends religious service: Not on file    Active member of club or organization: Not on file    Attends meetings of clubs or organizations: Not on file    Relationship status: Not on file  . Intimate partner violence    Fear of current or ex partner: Not on file    Emotionally abused: Not on file    Physically abused: Not on file    Forced sexual activity: Not on file  Other Topics Concern  . Not on file  Social History Narrative  . Not on file     ROS- All systems are reviewed and negative except as per the HPI above.  Physical Exam: Vitals:   01/23/19 1034  BP: 138/82  Pulse: 64  Weight: 66.2 kg  Height: 5\' 9"  (1.753 m)    GEN- The patient is well appearing, alert and oriented  x 3 today.   Head- normocephalic, atraumatic Eyes-  Sclera clear, conjunctiva pink Ears- hearing intact Oropharynx- clear Neck- supple  Lungs- Clear to ausculation bilaterally, normal work of breathing Heart- Regular rate and rhythm, 3/6 systolic murmur, rubs or gallops  GI- soft, NT, ND, + BS Extremities- no clubbing, cyanosis, or edema MS- no significant deformity or atrophy Skin- no rash or lesion Psych- euthymic mood, full affect Neuro- strength and sensation are intact  Wt Readings from Last 3 Encounters:  01/23/19 66.2 kg  12/21/18 65.3 kg  12/12/18 63.5 kg  EKG today demonstrates SR HR 64, NST (baseline), PR 192, QRS 102, QTc 437  Echo 01/09/19 demonstrated   1. The left ventricle has mildly reduced systolic function, with an ejection fraction of 45-50%. The cavity size was normal. Moderate basal septal hypertrophy. Left ventricular diastolic Doppler parameters are consistent with pseudonormalization.  Elevated left ventricular end-diastolic pressure Left ventricular diffuse hypokinesis. S/P VSD closure with no evidence of residual shunt by colorflow doppler.  2. The right ventricle has normal systolic function. The cavity was normal. There is no increase in right ventricular wall thickness.  3. S/P MV ring. Moderate thickening of the anterior mitral valve leaflet. Mitral valve regurgitation is moderate by color flow Doppler.  4. S/P ASD closure with no evidence of residual shunt by colorflow doppler.  Epic records are reviewed at length today  Assessment and Plan:  1. Atrial flutter Patient has not had reoccurrence of atrial flutter. General education about atrial flutter and anticoagulation provided and questions answered. Continue diltiazem 240 mg daily Continue Eliquis 5 mg BID Per Dr [name], would consider AAD or ablation if arrhythmia becomes sustained.   This patients CHA2DS2-VASc Score and unadjusted Ischemic Stroke Rate (% per year) is equal to 2.2 % stroke  rate/year from a score of 2  Above score calculated as 1 point each if present [CHF, HTN, DM, Vascular=MI/PAD/Aortic Plaque, Age if 65-74, or Male] Above score calculated as 2 points each if present [Age > 75, or Stroke/TIA/TE]   2. HTN Stable, no changes today.  3. S/p MVR Stable, no changes today.    Follow up with Dr [name] as scheduled. AF clinic in 3 months.    [facility] [address] [place], [place] [address] [phone] 01/23/2019 3:23 PM

## 2019-01-24 ENCOUNTER — Ambulatory Visit (HOSPITAL_COMMUNITY): Payer: Self-pay | Admitting: Nurse Practitioner

## 2019-01-24 ENCOUNTER — Telehealth: Payer: Self-pay | Admitting: *Deleted

## 2019-01-24 NOTE — Telephone Encounter (Signed)
Patient with diagnosis of aflutter on Eliquis for anticoagulation.    Procedure: EXCISION OF SUBCUTANEOUS LIPOMA Date of procedure: TBD  CHADS2-VASc score of  3 (CHF, HTN, AGE, DM2, stroke/tia x 2, CAD, AGE, male)  CrCl 74ml/min  Per office protocol, patient can hold Eliquis for 2 days prior to procedure.

## 2019-01-24 NOTE — Telephone Encounter (Signed)
   [facility] Pre-operative Risk Assessment    Request for surgical clearance:  1. What type of surgery is being performed? EXCISION OF SUBCUTANEOUS LIPOMA  2. When is this surgery scheduled? TBD  3. What type of clearance is required (medical clearance vs. Pharmacy clearance to hold med vs. Both)? BOTH  4. Are there any medications that need to be held prior to surgery and how long? ELIQUIS  5. Practice name and name of physician performing surgery? [facility]; DR. Rodman Key TSUEI  6. What is your office phone number [phone]   7.   What is your office fax number [phone]  8.   Anesthesia type (None, local, MAC, general) ? GENERAL   Julaine Hua 01/24/2019, 2:10 PM  _________________________________________________________________   (provider comments below)

## 2019-01-24 NOTE — Telephone Encounter (Signed)
   Primary Cardiologist: [name], MD  Chart reviewed as part of pre-operative protocol coverage. 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 Cardiac catheterization in 2017 demonstrated nonobstructive coronary artery disease in the setting of normal LVEF. He was seen by Dr. [name] in 12/2018 who recommended continuing diltiazem. 2D echo 01/09/19 showed EF 45-50%, moderate basal septal hypertrophy, elevated LVEDP, moderate MR. He saw Roderic Palau NP yesterday in afib clinic and was doing well.  Patient has not had ischemic eval since his EF has remained persistently low. Since lipoma excision will be under general anesthesia, will route to Dr. [name] to see if he feels compelled for patient to have this evaluated prior to surgery. With h/o cardiac repair I wonder if nuc would be challenging to interpret. The patient does not regularly exercise but is active with ADLs (carrying in groceries, going up stairs, light yardwork trimming bushes) without any angina, dyspnea or signs of volume overload. Dr. [name] - Please route response to P CV DIV PREOP (the pre-op pool). Thank you.  Terry Pitter, PA-C 01/24/2019, 2:37 PM

## 2019-01-26 NOTE — Telephone Encounter (Signed)
No need for ischemic testing before lipoma removal. OK to follow office protocol for anticoagulation.

## 2019-01-26 NOTE — Telephone Encounter (Signed)
Waiting on Dr. Hassell Done input, see Dayna's note.

## 2019-01-27 ENCOUNTER — Telehealth: Payer: Self-pay

## 2019-01-27 NOTE — Telephone Encounter (Deleted)
Left a voice message for the patient to call the office back to be given information holding his medication for his upcoming procedure

## 2019-01-27 NOTE — Telephone Encounter (Addendum)
Called and informed patient about holding his Eliquis 2 days prior to the procedure and restarting as soon as possible afterwards.

## 2019-01-27 NOTE — Telephone Encounter (Signed)
Left a voice message for the patient to call the office back to be given information holding his medication for his upcoming procedure

## 2019-01-27 NOTE — Telephone Encounter (Signed)
   Primary Cardiologist: [name], MD  Chart reviewed as part of pre-operative protocol coverage. Given past medical history and time since last visit, based on ACC/AHA guidelines, Terry Golden. would be at acceptable risk for the planned procedure without further cardiovascular testing.   I will route this recommendation to the requesting party via Epic fax function and remove from pre-op pool.  Please call with questions. Per clinical pharmacist, he will need to hold Eliquis for 2 days prior to the procedure and restart it as soon as possible afterward at the discretion of the surgeon.  [name], [place] 01/27/2019, 9:02 AM

## 2019-02-13 ENCOUNTER — Telehealth: Payer: Self-pay | Admitting: Interventional Cardiology

## 2019-02-13 NOTE — Telephone Encounter (Signed)
Called pt to inquire about the assistance application that the pt was given and pt stated that he could drop it off tomorrow. Pt would like some samples of Eliquis. Please address

## 2019-02-13 NOTE — Telephone Encounter (Signed)
New Message ° ° °Patient calling the office for samples of medication: ° ° °1.  What medication and dosage are you requesting samples for?apixaban (ELIQUIS) 5 MG TABS tablet  ° °2.  Are you currently out of this medication? Yes  ° ° ° °

## 2019-02-14 NOTE — Telephone Encounter (Signed)
**Note De-Identified Terry Golden Obfuscation** LMTCB. I did remind the pt in the VM that I left him that he will need to present his 2019 proof of income and his 2020 out of pocket expense report from his pharmacy along with his completed [employer] PT Asst application.

## 2019-02-14 NOTE — Telephone Encounter (Signed)
Follow up    Patient is calling to confirm whether or not samples are available. I also did advise him what he needed attached to his patient assistance application.

## 2019-02-15 NOTE — Telephone Encounter (Signed)
Called pt to inform him per Mardene Celeste Via, LPN that pt needed to bring the completed [employer]MS PT asst application, 9038 out of pocket expense report and his present 2019 proof of income back ASAP to be able to assist with samples of Eliquis, because samples are for pt that is just starting on this medication. I advised pt that if he has any other problems, questions or concerns to call the office. Pt verbalized understanding.

## 2019-02-21 ENCOUNTER — Telehealth: Payer: Self-pay | Admitting: Interventional Cardiology

## 2019-02-21 NOTE — Telephone Encounter (Signed)

## 2019-02-21 NOTE — Progress Notes (Signed)
Cardiology Office Note   Date:  02/22/2019   ID:  Terry Santee., DOB 1966-04-11, MRN [id number]  PCP:  Bernerd Limbo, MD    No chief complaint on file.  AFib  Wt Readings from Last 3 Encounters:  02/22/19 147 lb 6.4 oz (66.9 kg)  01/23/19 146 lb (66.2 kg)  12/21/18 144 lb (65.3 kg)       History of Present Illness: Terry Hang. is a 53 y.o. male   with a history of atrial flutter, rheumatic fever, congenital heart disease withpossible ostium primumASD, small residual VSD and severe MR with acleft mitral valve,status post atrioventricular septal defect repair and mitral valve repair at [facility] in 2000,tobacco use. Cardiac catheterization in 2017 demonstrated nonobstructive coronary artery disease who presents for follow up in the [facility]. Patient was admitted on 12/04/18 with atrial flutter with RVR and acute CHF. EF was 40% and was felt to be tachycardia mediated. Seen by Dr [name] 12/12/18 who recommend continuing diltiazem.  TEE/CV in 12/2018.  Denies : Chest pain. Dizziness. Leg edema. Nitroglycerin use. Orthopnea. Paroxysmal nocturnal dyspnea. Shortness of breath. Syncope.   Palpitations reduced.  Past Medical History:  Diagnosis Date  . ASD (atrial septal defect)    large   . Congenital heart disease    ASVD, Cleft Mitral Valve  . Echocardiogram    Echo 01/2019: EF 45-50, mod basal septal hypertrophy, Gr 2 DD, s/p VSD closure with no residual shunt,s/p MV ring with mod MR, s/p ASD closure with no residual shunt  . 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 Mitral valve disease 03/25/1999   s/p mitral valve repair with commissuroplasty   . Rheumatic fever   . Syncope    1999  . Typical atrial flutter ([place])   . VSD (ventricular septal defect)    small    Past Surgical History:  Procedure Laterality Date  . CARDIAC CATHETERIZATION N/A 09/20/2015   Procedure: Right/Left Heart Cath and Coronary Angiography;  Surgeon: Jettie Booze, MD;  Location: [place] CV LAB;  Service: Cardiovascular;  Laterality: N/A;  . CARDIAC SURGERY     s/p partial AVSD repair and cleft mitral valve repair with commissuroplasty  . CARDIAC VALVE REPLACEMENT    . CARDIOVERSION N/A 12/07/2018   Procedure: CARDIOVERSION;  Surgeon: Dorothy Spark, MD;  Location: [place];  Service: Cardiovascular;  Laterality: N/A;  . TEE WITHOUT CARDIOVERSION N/A 12/07/2018   Procedure: TRANSESOPHAGEAL ECHOCARDIOGRAM (TEE);  Surgeon: Dorothy Spark, MD;  Location: [facility] ENDOSCOPY;  Service: Cardiovascular;  Laterality: N/A;     Current Outpatient Medications  Medication Sig Dispense Refill  . apixaban (ELIQUIS) 5 MG TABS tablet Take 1 tablet (5 mg total) by mouth 2 (two) times daily. 180 tablet 1  . diltiazem (CARDIZEM CD) 240 MG 24 hr capsule Take 1 capsule (240 mg total) by mouth daily. 90 capsule 3  . lisinopril (ZESTRIL) 5 MG tablet Take 1 tablet (5 mg total) by mouth daily. 90 tablet 3   No current facility-administered medications for this visit.     Allergies:   Tensilon [edrophonium]    Social History:  The patient  reports that he has been smoking cigarettes. He has been smoking about 0.50 packs per day. He has never used smokeless tobacco. He reports current alcohol use. He reports that he does not use drugs.   Family History:  The patient's family history includes Alcohol abuse in an other family  member; Arthritis in some other family members; Diabetes in his sister; Heart attack in his father; Heart disease in his sister and other family members; Hypertension in an other family member; Kidney disease in an other family member; Mental illness in his sister and another family member; Stroke in his father and another family member.    ROS:  Please see the history of present illness.   Otherwise, review of systems are positive for palpitations improved.   All other systems are reviewed and negative.    PHYSICAL EXAM: VS:  BP  128/70   Pulse 69   Ht 5\' 9"  (1.753 m)   Wt 147 lb 6.4 oz (66.9 kg)   SpO2 93%   BMI 21.77 kg/m  , BMI Body mass index is 21.77 kg/m. GEN: Well nourished, well developed, in no acute distress  HEENT: normal  Neck: no JVD, carotid bruits, or masses Cardiac: irregularly irreglar; 2/6 systolic murmurs, no rubs, or gallops,no edema  Respiratory:  clear to auscultation bilaterally, normal work of breathing GI: soft, nontender, nondistended, + BS MS: no deformity or atrophy  Skin: warm and dry, no rash Neuro:  Strength and sensation are intact Psych: euthymic mood, full affect   EKG:   The ekg ordered June 2020 demonstrates NSR, LVH   Recent Labs: 12/04/2018: B Natriuretic Peptide 240.6; TSH 1.391 12/07/2018: ALT 35 01/09/2019: BUN 11; Creatinine, Ser 1.08; Hemoglobin 12.8; Platelets 231; Potassium 3.6; Sodium 140   Lipid Panel    Component Value Date/Time   CHOL 161 12/04/2018 0859   CHOL 176 09/05/2018 1000   TRIG 53 12/04/2018 0859   HDL 43 12/04/2018 0859   HDL 54 09/05/2018 1000   CHOLHDL 3.7 12/04/2018 0859   VLDL 11 12/04/2018 0859   LDLCALC 107 (H) 12/04/2018 0859   LDLCALC 113 (H) 09/05/2018 1000     Other studies Reviewed: Additional studies/ records that were reviewed today with results demonstrating: Cardioversion records reviewed.   ASSESSMENT AND PLAN:  1. Atrial flutter: Now in NSR.  Eliquis for stroke prevention.  Feels better in NSR.  He can tell a difference.  May need to consdier AAD if he has a recurrence. 2. Elevated BP: The current medical regimen is effective;  continue present plan and medications. 3. Tobacco abuse: Cutting back.  He is down to 2 cigs/day. 4. Decreased LV function/chronic systolic heart failure: Appears euvolemic. This patients CHA2DS2-VASc Score and unadjusted Ischemic Stroke Rate (% per year) is equal to 2.2 % stroke rate/year from a score of 2  Above score calculated as 1 point each if present [CHF, HTN, DM,  Vascular=MI/PAD/Aortic Plaque, Age if 65-74, or Male] Above score calculated as 2 points each if present [Age > 75, or Stroke/TIA/TE]  5. Recheck echo in 04/2019.   Current medicines are reviewed at length with the patient today.  The patient concerns regarding his medicines were addressed.  The following changes have been made:  No change  Labs/ tests ordered today include:  No orders of the defined types were placed in this encounter.   Recommend 150 minutes/week of aerobic exercise Low fat, low carb, high fiber diet recommended  Disposition:   FU in 6 months   Signed, [name], MD  02/22/2019 11:17 AM    [address], [place], [place]  [address] Phone: [phone]; Fax: [phone]

## 2019-02-22 ENCOUNTER — Encounter: Payer: Self-pay | Admitting: Interventional Cardiology

## 2019-02-22 ENCOUNTER — Other Ambulatory Visit: Payer: Self-pay

## 2019-02-22 ENCOUNTER — Ambulatory Visit (INDEPENDENT_AMBULATORY_CARE_PROVIDER_SITE_OTHER): Payer: Self-pay | Admitting: Interventional Cardiology

## 2019-02-22 ENCOUNTER — Telehealth: Payer: Self-pay

## 2019-02-22 VITALS — BP 128/70 | HR 69 | Ht 69.0 in | Wt 147.4 lb

## 2019-02-22 DIAGNOSIS — F17200 Nicotine dependence, unspecified, uncomplicated: Secondary | ICD-10-CM

## 2019-02-22 DIAGNOSIS — I5022 Chronic systolic (congestive) heart failure: Secondary | ICD-10-CM

## 2019-02-22 DIAGNOSIS — I4892 Unspecified atrial flutter: Secondary | ICD-10-CM

## 2019-02-22 DIAGNOSIS — I059 Rheumatic mitral valve disease, unspecified: Secondary | ICD-10-CM

## 2019-02-22 NOTE — Patient Instructions (Signed)
Medication Instructions:  No changes If you need a refill on your cardiac medications before your next appointment, please call your pharmacy.   Lab work: none If you have labs (blood work) drawn today and your tests are completely normal, you will receive your results only by: Marland Kitchen MyChart Message (if you have MyChart) OR . A paper copy in the mail If you have any lab test that is abnormal or we need to change your treatment, we will call you to review the results.  Testing/Procedures: Your physician has requested that you have an echocardiogram. Echocardiography is a painless test that uses sound waves to create images of your heart. It provides your doctor with information about the size and shape of your heart and how well your heart's chambers and valves are working. This procedure takes approximately one hour. There are no restrictions for this procedure.  PLEASE SCHEDULE FOR MID AUGUST PER DR. VARANASI  Follow-Up: At [facility], you and your health needs are our priority.  As part of our continuing mission to provide you with exceptional heart care, we have created designated Provider Care Teams.  These Care Teams include your primary Cardiologist (physician) and Advanced Practice Providers (APPs -  Physician Assistants and Nurse Practitioners) who all work together to provide you with the care you need, when you need it. You will need a follow up appointment in 4 months.  Please call our office 2 months in advance to schedule this appointment.  You may see [name], MD or one of the following Advanced Practice Providers on your designated Care Team:   [place], PA-C Melina Copa, PA-C . Ermalinda Barrios, PA-C  Any Other Special Instructions Will Be Listed Below (If Applicable).

## 2019-02-22 NOTE — Telephone Encounter (Signed)
The pt dropped off his BMS Pt Asst application at he office today. He advised the staff that he does not work so he has no proof of income. He was given 1 bottle of Eliquis 5 mg samples again today.  I called the pt and asked that he call BMS daily starting Friday 7/24 to check the progress of his application as he may need to provide more information such as a medicare denial letter and/or a signed letter stating that he earns no income. (He asked me to call back and leave BMSs phone number on his VM as he is driving and cannot write the number down at this time. I did as he requested and left him a VM with BMS phone number)  I made him aware that we need BMSs determination ASAP as the office will not be able to provide him many more samples and he will need to be switched to less expensive medication which most likely will be Coumadin.  He verbalized understanding and thanked me for calling him to let him know.

## 2019-02-23 NOTE — Telephone Encounter (Signed)
I completed the provider portion of the Pt Asst application and had Dr Angelena Form sign (he was DOD) Completed form was faxed to BMS.Marland KitchenMarland Kitchen

## 2019-02-24 NOTE — Telephone Encounter (Signed)
Letter received via fax from [facility] stating that they have approved the pt for pt assistance with his Eliquis. Approval good from 02/23/2019 until 02/23/2020.  I have notified the pt of this approval.

## 2019-03-06 ENCOUNTER — Ambulatory Visit: Payer: Self-pay | Admitting: Family Medicine

## 2019-03-10 ENCOUNTER — Ambulatory Visit (INDEPENDENT_AMBULATORY_CARE_PROVIDER_SITE_OTHER): Payer: Self-pay | Admitting: Family Medicine

## 2019-03-10 ENCOUNTER — Other Ambulatory Visit: Payer: Self-pay

## 2019-03-10 ENCOUNTER — Encounter: Payer: Self-pay | Admitting: Family Medicine

## 2019-03-10 ENCOUNTER — Telehealth: Payer: Self-pay | Admitting: Interventional Cardiology

## 2019-03-10 VITALS — BP 140/74 | HR 71 | Temp 97.7°F | Resp 14 | Ht 69.0 in | Wt 147.0 lb

## 2019-03-10 DIAGNOSIS — Q249 Congenital malformation of heart, unspecified: Secondary | ICD-10-CM

## 2019-03-10 DIAGNOSIS — F17200 Nicotine dependence, unspecified, uncomplicated: Secondary | ICD-10-CM

## 2019-03-10 MED ORDER — LISINOPRIL 5 MG PO TABS: 5.0000 mg | ORAL_TABLET | Freq: Every day | ORAL | 3 refills | Status: DC

## 2019-03-10 NOTE — Patient Instructions (Signed)

## 2019-03-10 NOTE — Telephone Encounter (Signed)
New Message    *STAT* If patient is at the pharmacy, call can be transferred to refill team.   1. Which medications need to be refilled? (please list name of each medication and dose if known) lisinopril (ZESTRIL) 5 MG tablet   2. Which pharmacy/location (including street and city if local pharmacy) is medication to be sent to? [id number] [place], [place] - [address]  3. Do they need a 30 day or 90 day supply? [place]

## 2019-03-10 NOTE — Progress Notes (Signed)
  Patient [facility]   Progress Note: General Provider: Lanae Boast, FNP  SUBJECTIVE:   Terry Golden. is a 53 y.o. male who  has a past medical history of ASD (atrial septal defect), Congenital heart disease, Echocardiogram, Lipoma of shoulder (03/17/2012), Mitral valve disease (03/25/1999), Rheumatic fever, Syncope, Typical atrial flutter ([place]), and VSD (ventricular septal defect).. Patient presents today for Follow-up (6 month for mitral valve disease ) Since the last visit, he was hospitalized for new onset A-Fib from 5/3-12/07/2018. Cardioversion performed and started on Eliquis. Patient is followed by cardiology. He stats that he is feeling much better and denies palpitations since hospitalization. No concerns today.    Review of Systems  Constitutional: Negative.   HENT: Negative.   Eyes: Negative.   Respiratory: Negative.   Cardiovascular: Negative.   Gastrointestinal: Negative.   Genitourinary: Negative.   Musculoskeletal: Negative.   Skin: Negative.   Neurological: Negative.   Psychiatric/Behavioral: Negative.      OBJECTIVE: BP 140/74 (BP Location: Left Arm, Patient Position: Sitting, Cuff Size: Normal)   Pulse 71   Temp 97.7 F (36.5 C) (Oral)   Resp 14   Ht 5\' 9"  (1.753 m)   Wt 147 lb (66.7 kg)   SpO2 100%   BMI 21.71 kg/m   Wt Readings from Last 3 Encounters:  03/10/19 147 lb (66.7 kg)  02/22/19 147 lb 6.4 oz (66.9 kg)  01/23/19 146 lb (66.2 kg)     Physical Exam Vitals signs and nursing note reviewed.  Constitutional:      General: He is not in acute distress.    Appearance: Normal appearance.  HENT:     Head: Normocephalic and atraumatic.  Eyes:     Extraocular Movements: Extraocular movements intact.     Conjunctiva/sclera: Conjunctivae normal.     Pupils: Pupils are equal, round, and reactive to light.  Cardiovascular:     Rate and Rhythm: Normal rate and regular rhythm.     Heart sounds: No  murmur.  Pulmonary:     Effort: Pulmonary effort is normal.     Breath sounds: Normal breath sounds.  Musculoskeletal: Normal range of motion.  Skin:    General: Skin is warm and dry.  Neurological:     Mental Status: He is alert and oriented to person, place, and time.  Psychiatric:        Mood and Affect: Mood normal.        Behavior: Behavior normal.        Thought Content: Thought content normal.        Judgment: Judgment normal.     ASSESSMENT/PLAN:   1. Congenital heart disease No medication changes warranted at the present time.  Patient is being followed by a specialist for this condition. Patient advised to continue with follow up appointments. PCP will continue to monitor progress.    2. Smoker Smoking cessation instruction/counseling given:  counseled patient on the dangers of tobacco use, advised patient to stop smoking, and reviewed strategies to maximize success    Return in about 6 months (around 09/10/2019).    The patient was given clear instructions to go to ER or return to medical center if symptoms do not improve, worsen or new problems develop. The patient verbalized understanding and agreed with plan of care.   Ms. Doug Sou. Nathaneil Canary, FNP-BC [facility] [address] [place],  [address] [phone]

## 2019-03-20 ENCOUNTER — Other Ambulatory Visit: Payer: Self-pay

## 2019-03-20 ENCOUNTER — Ambulatory Visit (HOSPITAL_COMMUNITY): Payer: Self-pay

## 2019-03-27 ENCOUNTER — Encounter: Payer: Self-pay | Admitting: Family Medicine

## 2019-04-12 ENCOUNTER — Encounter (HOSPITAL_COMMUNITY): Payer: Self-pay | Admitting: *Deleted

## 2019-04-12 ENCOUNTER — Encounter (HOSPITAL_COMMUNITY): Payer: Self-pay

## 2019-04-17 ENCOUNTER — Ambulatory Visit (HOSPITAL_COMMUNITY): Payer: Self-pay | Attending: Cardiology

## 2019-04-17 ENCOUNTER — Other Ambulatory Visit: Payer: Self-pay

## 2019-04-17 DIAGNOSIS — F17200 Nicotine dependence, unspecified, uncomplicated: Secondary | ICD-10-CM | POA: Insufficient documentation

## 2019-04-17 DIAGNOSIS — I4892 Unspecified atrial flutter: Secondary | ICD-10-CM | POA: Insufficient documentation

## 2019-04-17 DIAGNOSIS — I5022 Chronic systolic (congestive) heart failure: Secondary | ICD-10-CM | POA: Insufficient documentation

## 2019-04-17 DIAGNOSIS — I059 Rheumatic mitral valve disease, unspecified: Secondary | ICD-10-CM | POA: Insufficient documentation

## 2019-04-26 ENCOUNTER — Encounter (HOSPITAL_COMMUNITY): Payer: Self-pay | Admitting: Physician Assistant

## 2019-04-26 ENCOUNTER — Other Ambulatory Visit: Payer: Self-pay

## 2019-04-26 ENCOUNTER — Ambulatory Visit (HOSPITAL_COMMUNITY): Payer: Self-pay | Admitting: Physician Assistant

## 2019-04-26 ENCOUNTER — Ambulatory Visit (HOSPITAL_COMMUNITY)
Admission: RE | Admit: 2019-04-26 | Discharge: 2019-04-26 | Disposition: A | Discharge status: Home / Self Care | Payer: Self-pay | Source: Ambulatory Visit | Attending: Physician Assistant | Admitting: Physician Assistant

## 2019-04-26 VITALS — BP 150/88 | HR 65 | Ht 69.0 in | Wt 152.6 lb

## 2019-04-26 DIAGNOSIS — Q249 Congenital malformation of heart, unspecified: Secondary | ICD-10-CM | POA: Insufficient documentation

## 2019-04-26 DIAGNOSIS — Q211 Atrial septal defect: Secondary | ICD-10-CM | POA: Insufficient documentation

## 2019-04-26 DIAGNOSIS — Z79899 Other long term (current) drug therapy: Secondary | ICD-10-CM | POA: Insufficient documentation

## 2019-04-26 DIAGNOSIS — Z7901 Long term (current) use of anticoagulants: Secondary | ICD-10-CM | POA: Insufficient documentation

## 2019-04-26 DIAGNOSIS — F17210 Nicotine dependence, cigarettes, uncomplicated: Secondary | ICD-10-CM | POA: Insufficient documentation

## 2019-04-26 DIAGNOSIS — I059 Rheumatic mitral valve disease, unspecified: Secondary | ICD-10-CM | POA: Insufficient documentation

## 2019-04-26 DIAGNOSIS — I483 Typical atrial flutter: Secondary | ICD-10-CM | POA: Insufficient documentation

## 2019-04-26 DIAGNOSIS — Z952 Presence of prosthetic heart valve: Secondary | ICD-10-CM | POA: Insufficient documentation

## 2019-04-26 DIAGNOSIS — Z8249 Family history of ischemic heart disease and other diseases of the circulatory system: Secondary | ICD-10-CM | POA: Insufficient documentation

## 2019-04-26 DIAGNOSIS — I4892 Unspecified atrial flutter: Secondary | ICD-10-CM

## 2019-04-26 MED ORDER — DILTIAZEM HCL ER COATED BEADS 300 MG PO CP24: 300.0000 mg | ORAL_CAPSULE | Freq: Every day | ORAL | 2 refills | Status: DC

## 2019-04-26 NOTE — Progress Notes (Addendum)
Primary Care Physician: Lanae Boast, FNP Primary Cardiologist: Dr [name] Primary Electrophysiologist: Dr [name] Referring Physician: Dr Okey Dupre. is a 53 y.o. 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  Cardiac catheterization in 2017 demonstrated nonobstructive coronary artery disease who presents for follow up in the [facility]. Patient was admitted on 12/04/18 with atrial flutter with RVR and acute CHF. EF was 40% and was felt to be tachycardia mediated. Seen by Dr [name] 12/12/18 who recommend continuing diltiazem.  On follow up today, patient reports that he has done reasonably well. He has had some increased palpitations with activity the past couple days. The palpitations resolve quickly, within a few seconds. His recent echo showed normal EF 55-60%.  Today, he denies symptoms of chest pain, shortness of breath, orthopnea, PND, lower extremity edema, dizziness, presyncope, syncope, snoring, daytime somnolence, bleeding, or neurologic sequela. The patient is tolerating medications without difficulties and is otherwise without complaint today.    Atrial Fibrillation Risk Factors:  he does not have symptoms or diagnosis of sleep apnea. He does have a history of rheumatic fever. he does not have a history of alcohol use.   he has a BMI of Body mass index is 22.54 kg/m.Marland Kitchen Filed Weights   04/26/19 1108  Weight: 69.2 kg    Family History  Problem Relation Age of Onset  . Diabetes Sister   . Mental illness Sister   . Heart disease Sister   . Arthritis Other   . Hypertension Other   . Alcohol abuse Other   . Heart attack Father   . Stroke Father   . Heart disease Other   . Arthritis Other   .  Heart disease Other   . Stroke Other   . Mental illness Other   . Kidney disease Other      Atrial Fibrillation Management history:  Previous antiarrhythmic drugs: none Previous cardioversions: none Previous ablations: none CHADS2VASC score: 2 Anticoagulation history: Eliquis   Past Medical History:  Diagnosis Date  . ASD (atrial septal defect)    large   . Congenital heart disease    ASVD, Cleft Mitral Valve  . Echocardiogram    Echo 01/2019: EF 45-50, mod basal septal hypertrophy, Gr 2 DD, s/p VSD closure with no residual shunt,s/p MV ring with mod MR, s/p ASD closure with no residual shunt  . 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 Mitral valve disease 03/25/1999   s/p mitral valve repair with commissuroplasty   . Rheumatic fever   . Syncope    1999  . Typical atrial flutter ([place])   . VSD (ventricular septal defect)    small   Past Surgical History:  Procedure Laterality Date  . CARDIAC CATHETERIZATION N/A 09/20/2015   Procedure: Right/Left Heart Cath and Coronary Angiography;  Surgeon: Jettie Booze, MD;  Location: [facility];  Service: Cardiovascular;  Laterality: N/A;  . CARDIAC SURGERY     s/p partial AVSD repair and cleft mitral valve repair with commissuroplasty  . CARDIAC VALVE REPLACEMENT    . CARDIOVERSION N/A 12/07/2018   Procedure: CARDIOVERSION;  Surgeon: Dorothy Spark, MD;  Location: [place];  Service: Cardiovascular;  Laterality: N/A;  . TEE WITHOUT CARDIOVERSION N/A 12/07/2018  Procedure: TRANSESOPHAGEAL ECHOCARDIOGRAM (TEE);  Surgeon: Dorothy Spark, MD;  Location: [facility];  Service: Cardiovascular;  Laterality: N/A;    Current Outpatient Medications  Medication Sig Dispense Refill  . apixaban (ELIQUIS) 5 MG TABS tablet Take 1 tablet (5 mg total) by mouth 2 (two) times daily. 180 tablet 1  . diltiazem (CARDIZEM CD) 240 MG 24 hr capsule Take 1 capsule (240 mg total) by mouth daily. 90 capsule 3  . lisinopril (ZESTRIL) 5  MG tablet Take 1 tablet (5 mg total) by mouth daily. 90 tablet 3   No current facility-administered medications for this encounter.     Allergies  Allergen Reactions  . Tensilon [Edrophonium] Other (See Comments)    Blacked out     Social History   Socioeconomic History  . Marital status: Divorced    Spouse name: Not on file  . Number of children: 1  . Years of education: 70  . Highest education level: Not on file  Occupational History  . Not on file  Social Needs  . Financial resource strain: Not on file  . Food insecurity    Worry: Not on file    Inability: Not on file  . Transportation needs    Medical: Not on file    Non-medical: Not on file  Tobacco Use  . Smoking status: Current Every Day Smoker    Packs/day: 0.03    Types: Cigarettes  . Smokeless tobacco: Never Used  . Tobacco comment: 3 per day; Started smoking as a teenager  Substance and Sexual Activity  . Alcohol use: Yes    Alcohol/week: 0.0 standard drinks    Comment: Occasional social  . Drug use: No  . Sexual activity: Not on file  Lifestyle  . Physical activity    Days per week: Not on file    Minutes per session: Not on file  . Stress: Not on file  Relationships  . Social Herbalist on phone: Not on file    Gets together: Not on file    Attends religious service: Not on file    Active member of club or organization: Not on file    Attends meetings of clubs or organizations: Not on file    Relationship status: Not on file  . Intimate partner violence    Fear of current or ex partner: Not on file    Emotionally abused: Not on file    Physically abused: Not on file    Forced sexual activity: Not on file  Other Topics Concern  . Not on file  Social History Narrative  . Not on file     ROS- All systems are reviewed and negative except as per the HPI above.  Physical Exam: Vitals:   04/26/19 1108  BP: (!) 150/88  Pulse: 65  Weight: 69.2 kg  Height: 5\' 9"  (1.753 m)     GEN- The patient is well appearing, alert and oriented x 3 today.   HEENT-head normocephalic, atraumatic, sclera clear, conjunctiva pink, hearing intact, trachea midline. Lungs- Clear to ausculation bilaterally, normal work of breathing Heart- Regular rate and rhythm, no rubs or gallops. 2/6 systolic murmur GI- soft, NT, ND, + BS Extremities- no clubbing, cyanosis, or edema MS- no significant deformity or atrophy Skin- no rash or lesion Psych- euthymic mood, full affect Neuro- strength and sensation are intact   Wt Readings from Last 3 Encounters:  04/26/19 69.2 kg  03/10/19 66.7 kg  02/22/19 66.9 kg  EKG today demonstrates SR HR 65, LAFB, NST, PR 196, QRS 100, QTc 440  Echo 04/17/19 demonstrated  1. The left ventricle has normal systolic function, with an ejection fraction of 55-60%. The cavity size was normal. There is moderate concentric left ventricular hypertrophy. Left ventricular diastolic Doppler parameters are consistent with  pseudonormalization. Elevated left atrial and left ventricular end-diastolic pressures The E/e' is 24.  2. The right ventricle has normal systolic function. The cavity was normal. There is no increase in right ventricular wall thickness. Right ventricular systolic pressure is normal with an estimated pressure of 20.4 mmHg.  3. Left atrial size was mildly dilated.  4. A commisuroplasty valve is present in the mitral position. Procedure Date: 03/25/1999.  5. Mitral valve regurgitation is mild to moderate by color flow Doppler. Mild mitral valve stenosis.  6. The aortic valve is tricuspid. No stenosis of the aortic valve.  7. The aorta is normal unless otherwise noted.  8. The aortic root, ascending aorta and aortic arch are normal in size and structure.  9. 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 No  residual shunting seen.  Epic records are reviewed at length today  Assessment and Plan:  1. Atrial flutter Patient having brief episodes of heart racing lasting just a few seconds. Increase diltiazem to 300 mg daily Continue Eliquis 5 mg BID Per Dr [name], would consider AAD or ablation if arrhythmia becomes sustained.   This patients CHA2DS2-VASc Score and unadjusted Ischemic Stroke Rate (% per year) is equal to 2.2 % stroke rate/year from a score of 2  Above score calculated as 1 point each if present [CHF, HTN, DM, Vascular=MI/PAD/Aortic Plaque, Age if 65-74, or Male] Above score calculated as 2 points each if present [Age > 75, or Stroke/TIA/TE]   2. HTN Elevated today, med changes as above.  3. S/p MVR Recent echo showed mild-mod MR, stable from prior.   Follow up with Dr [name] per recall. AF clinic in 6 months. Patient also requests to speak with CSW, will refer.    [facility] [address] [place], [place] [address] [phone] 04/26/2019 11:37 AM

## 2019-04-26 NOTE — Patient Instructions (Signed)
Increase cardizem to 300mg once a day  

## 2019-04-27 ENCOUNTER — Telehealth (HOSPITAL_COMMUNITY): Payer: Self-pay | Admitting: Licensed Clinical Social Worker

## 2019-04-27 NOTE — Telephone Encounter (Signed)
CSW consulted yesterday to speak with pt regarding insurance concerns.  CSW called pt to discuss.  Pt reports he has applied for both medicaid and disability this year and was denied both despite involving a Chief Executive Officer.  Patient states he has not worked since 2016 due to medical limitations and has been staying with his mom who is supporting him.  CSW discussed alternative options to get coverage with patient.  First discussed [employer] which it pt qualifies would help backpay for all [facility] bills for up to 240 days and would act as coverage for [facility] facilities for 6 months moving forward.  CSW mailing patient application as well as non-filing form as patient has not filed taxes in last several years and needs proof of this.  CSW also discussed that the marketplace was opening up in October and that he should look into applying for longer term healthcare coverage through that program.  Pt states that his mom has already suggested this and they are planning to look into it next month when it is available.  Pt reports he is able to afford his medications at this time despite lack of insurance- has patient assistance for eliquis and his other 2 medications are affordable with GoodRx card.  No further needs at this time- CSW encouraged pt to reach out if he had any questions regarding the [facility] Assistance application once it arrives.  Jorge Ny, LCSW Clinical Social Worker [facility] Desk#: [phone] Cell#: [phone]

## 2019-08-14 NOTE — Progress Notes (Deleted)
Cardiology Office Note    Date:  08/14/2019   ID:  Terry Golden., DOB May 21, 1966, MRN [id number]  PCP:  Lanae Boast, [place]  Cardiologist: [name], MD EPS: [name], MD  No chief complaint on file.   History of Present Illness:  Terry Golden. is a 54 y.o. 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  Cardiac catheterization in 2017 nonobstructive coronary artery disease.  Also has hypertension, tobacco abuse, LV dysfunction with chronic systolic CHF LVEF [id number] follow-up echo 04/2019 LVEF 55 to 60%.  Patient was admitted on 12/04/18 with atrial flutter with RVR and acute CHF. EF was 40% and was felt to be tachycardia mediated. Seen by Dr [name] 12/12/18 who recommend continuing diltiazem.S/P TEE/CV in 12/2018.  Patient was last seen in the A. fib clinic 04/26/2019 at which time he was still having brief episodes of heart racing.  Diltiazem increased to 300 mg daily.  Per Dr. [name] would consider AAD or ablation if arrhythmias become sustained.        Past Medical History:  Diagnosis Date  . ASD (atrial septal defect)    large   . Congenital heart disease    ASVD, Cleft Mitral Valve  . Echocardiogram    Echo 01/2019: EF 45-50, mod basal septal hypertrophy, Gr 2 DD, s/p VSD closure with no residual shunt,s/p MV ring with mod MR, s/p ASD closure with no residual shunt  . 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 Mitral valve disease 03/25/1999   s/p mitral valve repair with commissuroplasty   . Rheumatic fever   . Syncope    1999  . Typical atrial flutter ([place])   . VSD (ventricular septal defect)    small    Past Surgical History:  Procedure Laterality Date  . CARDIAC CATHETERIZATION N/A 09/20/2015   Procedure: Right/Left  Heart Cath and Coronary Angiography;  Surgeon: Jettie Booze, MD;  Location: [place] [facility];  Service: Cardiovascular;  Laterality: N/A;  . CARDIAC SURGERY     s/p partial AVSD repair and cleft mitral valve repair with commissuroplasty  . CARDIAC VALVE REPLACEMENT    . CARDIOVERSION N/A 12/07/2018   Procedure: CARDIOVERSION;  Surgeon: Dorothy Spark, MD;  Location: [place];  Service: Cardiovascular;  Laterality: N/A;  . TEE WITHOUT CARDIOVERSION N/A 12/07/2018   Procedure: TRANSESOPHAGEAL ECHOCARDIOGRAM (TEE);  Surgeon: Dorothy Spark, MD;  Location: [facility] ENDOSCOPY;  Service: Cardiovascular;  Laterality: N/A;    Current Medications: No outpatient medications have been marked as taking for the 08/15/19 encounter (Appointment) with Imogene Burn, PA-C.     Allergies:   Tensilon [edrophonium]   Social History   Socioeconomic History  . Marital status: Divorced    Spouse name: Not on file  . Number of children: 1  . Years of education: 79  . Highest education level: Not on file  Occupational History  . Not on file  Tobacco Use  . Smoking status: Current Every Day Smoker    Packs/day: 0.03    Types: Cigarettes  . Smokeless tobacco: Never Used  . Tobacco comment: 3 per day; Started smoking as a teenager  Substance and Sexual Activity  . Alcohol use: Yes    Alcohol/week: 0.0 standard drinks  Comment: Occasional social  . Drug use: No  . Sexual activity: Not on file  Other Topics Concern  . Not on file  Social History Narrative  . Not on file   Social Determinants of Health   Financial Resource Strain:   . Difficulty of Paying Living Expenses: Not on file  Food Insecurity:   . Worried About Charity fundraiser in the Last Year: Not on file  . Ran Out of Food in the Last Year: Not on file  Transportation Needs:   . Lack of Transportation (Medical): Not on file  . Lack of Transportation (Non-Medical): Not on file  Physical Activity:   . Days of  Exercise per Week: Not on file  . Minutes of Exercise per Session: Not on file  Stress:   . Feeling of Stress : Not on file  Social Connections:   . Frequency of Communication with Friends and Family: Not on file  . Frequency of Social Gatherings with Friends and Family: Not on file  . Attends Religious Services: Not on file  . Active Member of Clubs or Organizations: Not on file  . Attends Archivist Meetings: Not on file  . Marital Status: Not on file     Family History:  The patient's ***family history includes Alcohol abuse in an other family member; Arthritis in some other family members; Diabetes in his sister; Heart attack in his father; Heart disease in his sister and other family members; Hypertension in an other family member; Kidney disease in an other family member; Mental illness in his sister and another family member; Stroke in his father and another family member.   ROS:   Please see the history of present illness.    ROS All other systems reviewed and are negative.   PHYSICAL EXAM:   VS:  There were no vitals taken for this visit.  Physical Exam  GEN: Well nourished, well developed, in no acute distress  HEENT: normal  Neck: no JVD, carotid bruits, or masses Cardiac:RRR; no murmurs, rubs, or gallops  Respiratory:  clear to auscultation bilaterally, normal work of breathing GI: soft, nontender, nondistended, + BS Ext: without cyanosis, clubbing, or edema, Good distal pulses bilaterally MS: no deformity or atrophy  Skin: warm and dry, no rash Neuro:  Alert and Oriented x 3, Strength and sensation are intact Psych: euthymic mood, full affect  Wt Readings from Last 3 Encounters:  04/26/19 152 lb 9.6 oz (69.2 kg)  03/10/19 147 lb (66.7 kg)  02/22/19 147 lb 6.4 oz (66.9 kg)      Studies/Labs Reviewed:   EKG:  EKG is*** ordered today.  The ekg ordered today demonstrates ***  Recent Labs: 12/04/2018: B Natriuretic Peptide 240.6; TSH 1.391 12/07/2018: ALT  35 01/09/2019: BUN 11; Creatinine, Ser 1.08; Hemoglobin 12.8; Platelets 231; Potassium 3.6; Sodium 140   Lipid Panel    Component Value Date/Time   CHOL 161 12/04/2018 0859   CHOL 176 09/05/2018 1000   TRIG 53 12/04/2018 0859   HDL 43 12/04/2018 0859   HDL 54 09/05/2018 1000   CHOLHDL 3.7 12/04/2018 0859   VLDL 11 12/04/2018 0859   LDLCALC 107 (H) 12/04/2018 0859   LDLCALC 113 (H) 09/05/2018 1000    Additional studies/ records that were reviewed today include:  Echo 04/17/19   1. The left ventricle has normal systolic function, with an ejection fraction of 55-60%. The cavity size was normal. There is moderate concentric left ventricular hypertrophy. Left ventricular diastolic Doppler  parameters are consistent with  pseudonormalization. Elevated left atrial and left ventricular end-diastolic pressures The E/e' is 24.  2. The right ventricle has normal systolic function. The cavity was normal. There is no increase in right ventricular wall thickness. Right ventricular systolic pressure is normal with an estimated pressure of 20.4 mmHg.  3. Left atrial size was mildly dilated.  4. A commisuroplasty valve is present in the mitral position. Procedure Date: 03/25/1999.  5. Mitral valve regurgitation is mild to moderate by color flow Doppler. Mild mitral valve stenosis.  6. The aortic valve is tricuspid. No stenosis of the aortic valve.  7. The aorta is normal unless otherwise noted.  8. The aortic root, ascending aorta and aortic arch are normal in size and structure.  9. 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 No residual shunting seen.       ASSESSMENT:    No diagnosis found.   PLAN:  In order of problems listed above:  History of atrial flutter status post TEE/CV 12/2018 score equals 2 on Eliquis.  Diltiazem increased to 300  mg daily 04/2019 for palpitations.  Dr. [name] would consider AAD or ablation if arrhythmia becomes sustained.  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  Concerns regarding medicines are outlined above.  Medication changes, Labs and Tests ordered today are listed in the Patient Instructions below. There are no Patient Instructions on file for this visit.   Signed, Ermalinda Barrios, PA-C  08/14/2019 2:40 PM    [facility] [place], [place], [place]  [address] Phone: [phone]; Fax: [phone]

## 2019-08-15 ENCOUNTER — Ambulatory Visit: Payer: Self-pay | Admitting: Physician Assistant

## 2019-09-06 ENCOUNTER — Other Ambulatory Visit: Payer: Self-pay

## 2019-09-06 ENCOUNTER — Ambulatory Visit (INDEPENDENT_AMBULATORY_CARE_PROVIDER_SITE_OTHER): Payer: Self-pay | Admitting: Nurse Practitioner

## 2019-09-06 ENCOUNTER — Encounter: Payer: Self-pay | Admitting: Nurse Practitioner

## 2019-09-06 VITALS — BP 144/76 | HR 55 | Temp 97.9°F | Resp 16 | Ht 69.0 in | Wt 149.0 lb

## 2019-09-06 DIAGNOSIS — I4891 Unspecified atrial fibrillation: Secondary | ICD-10-CM

## 2019-09-06 DIAGNOSIS — I484 Atypical atrial flutter: Secondary | ICD-10-CM

## 2019-09-06 DIAGNOSIS — Z0000 Encounter for general adult medical examination without abnormal findings: Secondary | ICD-10-CM

## 2019-09-06 NOTE — Patient Instructions (Signed)
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
## 2019-09-07 LAB — CBC WITH DIFFERENTIAL/PLATELET
Basophils Absolute: 0 10*3/uL (ref 0.0–0.2)
Basos: 1 %
EOS (ABSOLUTE): 0.3 10*3/uL (ref 0.0–0.4)
Eos: 6 %
Hematocrit: 36 % — ABNORMAL LOW (ref 37.5–51.0)
Hemoglobin: 12.1 g/dL — ABNORMAL LOW (ref 13.0–17.7)
Immature Grans (Abs): 0 10*3/uL (ref 0.0–0.1)
Immature Granulocytes: 0 %
Lymphocytes Absolute: 1.5 10*3/uL (ref 0.7–3.1)
Lymphs: 26 %
MCH: 33.1 pg — ABNORMAL HIGH (ref 26.6–33.0)
MCHC: 33.6 g/dL (ref 31.5–35.7)
MCV: 98 fL — ABNORMAL HIGH (ref 79–97)
Monocytes Absolute: 0.3 10*3/uL (ref 0.1–0.9)
Monocytes: 6 %
Neutrophils Absolute: 3.6 10*3/uL (ref 1.4–7.0)
Neutrophils: 61 %
Platelets: 285 10*3/uL (ref 150–450)
RBC: 3.66 x10E6/uL — ABNORMAL LOW (ref 4.14–5.80)
RDW: 12.5 % (ref 11.6–15.4)
WBC: 5.8 10*3/uL (ref 3.4–10.8)

## 2019-09-07 LAB — COMP. METABOLIC PANEL (12)
AST: 14 IU/L (ref 0–40)
Albumin/Globulin Ratio: 1.7 (ref 1.2–2.2)
Albumin: 4 g/dL (ref 3.8–4.9)
Alkaline Phosphatase: 71 IU/L (ref 39–117)
BUN/Creatinine Ratio: 11 (ref 9–20)
BUN: 10 mg/dL (ref 6–24)
Bilirubin Total: 0.3 mg/dL (ref 0.0–1.2)
Calcium: 9 mg/dL (ref 8.7–10.2)
Chloride: 107 mmol/L — ABNORMAL HIGH (ref 96–106)
Creatinine, Ser: 0.95 mg/dL (ref 0.76–1.27)
GFR calc Af Amer: 104 mL/min/{1.73_m2} (ref >59)
GFR calc non Af Amer: 90 mL/min/{1.73_m2} (ref >59)
Globulin, Total: 2.4 g/dL (ref 1.5–4.5)
Glucose: 79 mg/dL (ref 65–99)
Potassium: 4.7 mmol/L (ref 3.5–5.2)
Sodium: 142 mmol/L (ref 134–144)
Total Protein: 6.4 g/dL (ref 6.0–8.5)

## 2019-09-07 LAB — MAGNESIUM: Magnesium: 2.1 mg/dL (ref 1.6–2.3)

## 2019-09-07 LAB — VITAMIN D 25 HYDROXY (VIT D DEFICIENCY, FRACTURES): Vit D, 25-Hydroxy: 30.3 ng/mL (ref 30.0–100.0)

## 2019-09-07 LAB — VITAMIN B12: Vitamin B-12: 423 pg/mL (ref 232–1245)

## 2019-10-05 ENCOUNTER — Other Ambulatory Visit: Payer: Self-pay | Admitting: Interventional Cardiology

## 2019-10-11 ENCOUNTER — Other Ambulatory Visit: Payer: Self-pay | Admitting: Interventional Cardiology

## 2019-10-23 ENCOUNTER — Encounter (HOSPITAL_COMMUNITY): Payer: Self-pay | Admitting: Physician Assistant

## 2019-10-23 ENCOUNTER — Other Ambulatory Visit: Payer: Self-pay

## 2019-10-23 ENCOUNTER — Ambulatory Visit (HOSPITAL_COMMUNITY)
Admission: RE | Admit: 2019-10-23 | Discharge: 2019-10-23 | Disposition: A | Discharge status: Home / Self Care | Payer: Self-pay | Source: Ambulatory Visit | Attending: Physician Assistant | Admitting: Physician Assistant

## 2019-10-23 VITALS — BP 140/86 | HR 65 | Ht 69.0 in | Wt 151.0 lb

## 2019-10-23 DIAGNOSIS — F17210 Nicotine dependence, cigarettes, uncomplicated: Secondary | ICD-10-CM | POA: Insufficient documentation

## 2019-10-23 DIAGNOSIS — D6869 Other thrombophilia: Secondary | ICD-10-CM

## 2019-10-23 DIAGNOSIS — Q210 Ventricular septal defect: Secondary | ICD-10-CM | POA: Insufficient documentation

## 2019-10-23 DIAGNOSIS — Q211 Atrial septal defect: Secondary | ICD-10-CM | POA: Insufficient documentation

## 2019-10-23 DIAGNOSIS — Z79899 Other long term (current) drug therapy: Secondary | ICD-10-CM | POA: Insufficient documentation

## 2019-10-23 DIAGNOSIS — I10 Essential (primary) hypertension: Secondary | ICD-10-CM | POA: Insufficient documentation

## 2019-10-23 DIAGNOSIS — Z7901 Long term (current) use of anticoagulants: Secondary | ICD-10-CM | POA: Insufficient documentation

## 2019-10-23 DIAGNOSIS — Q238 Other congenital malformations of aortic and mitral valves: Secondary | ICD-10-CM | POA: Insufficient documentation

## 2019-10-23 DIAGNOSIS — I4892 Unspecified atrial flutter: Secondary | ICD-10-CM

## 2019-10-23 DIAGNOSIS — Z8249 Family history of ischemic heart disease and other diseases of the circulatory system: Secondary | ICD-10-CM | POA: Insufficient documentation

## 2019-10-23 DIAGNOSIS — Z952 Presence of prosthetic heart valve: Secondary | ICD-10-CM | POA: Insufficient documentation

## 2019-10-23 NOTE — Progress Notes (Signed)
Primary Care Physician: Lanae Boast, FNP Primary Cardiologist: Dr [name] Primary Electrophysiologist: Dr [name] Referring Physician: Dr Okey Dupre. is a 54 y.o. 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  Cardiac catheterization in 2017 demonstrated nonobstructive coronary artery disease who presents for follow up in the [facility]. Patient was admitted on 12/04/18 with atrial flutter with RVR and acute CHF. EF was 40% and was felt to be tachycardia mediated. Seen by Dr [name] 12/12/18 who recommend continuing diltiazem. Follow up echo showed normal EF 55-60%.  On follow up today, patient reports that he has done well since his last visit. He has rare palpitations which last about 1-2 seconds and then resolve. He is tolerating the medication without difficulty.   Today, he denies symptoms of chest pain, shortness of breath, orthopnea, PND, lower extremity edema, dizziness, presyncope, syncope, snoring, daytime somnolence, bleeding, or neurologic sequela. The patient is tolerating medications without difficulties and is otherwise without complaint today.    Atrial Fibrillation Risk Factors:  he does not have symptoms or diagnosis of sleep apnea. He does have a history of rheumatic fever. he does not have a history of alcohol use.   he has a BMI of Body mass index is 22.3 kg/m.Marland Kitchen Filed Weights   10/23/19 0839  Weight: 68.5 kg    Family History  Problem Relation Age of Onset  . Diabetes Sister   . Mental illness Sister   . Heart disease Sister   . Arthritis Other   . Hypertension Other   . Alcohol abuse Other   . Heart attack Father   . Stroke Father   . Heart disease Other   . Arthritis Other   .  Heart disease Other   . Stroke Other   . Mental illness Other   . Kidney disease Other      Atrial Fibrillation Management history:  Previous antiarrhythmic drugs: none Previous cardioversions: none Previous ablations: none CHADS2VASC score: 2 Anticoagulation history: Eliquis   Past Medical History:  Diagnosis Date  . ASD (atrial septal defect)    large   . Congenital heart disease    ASVD, Cleft Mitral Valve  . Echocardiogram    Echo 01/2019: EF 45-50, mod basal septal hypertrophy, Gr 2 DD, s/p VSD closure with no residual shunt,s/p MV ring with mod MR, s/p ASD closure with no residual shunt  . 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 Mitral valve disease 03/25/1999   s/p mitral valve repair with commissuroplasty   . Rheumatic fever   . Syncope    1999  . Typical atrial flutter ([place])   . VSD (ventricular septal defect)    small   Past Surgical History:  Procedure Laterality Date  . CARDIAC CATHETERIZATION N/A 09/20/2015   Procedure: Right/Left Heart Cath and Coronary Angiography;  Surgeon: Jettie Booze, MD;  Location: [place] [facility];  Service: Cardiovascular;  Laterality: N/A;  . CARDIAC SURGERY     s/p partial AVSD repair and cleft mitral valve repair with commissuroplasty  . CARDIAC VALVE REPLACEMENT    . CARDIOVERSION N/A 12/07/2018   Procedure: CARDIOVERSION;  Surgeon: Dorothy Spark, MD;  Location: [facility] ENDOSCOPY;  Service: Cardiovascular;  Laterality: N/A;  . TEE WITHOUT  CARDIOVERSION N/A 12/07/2018   Procedure: TRANSESOPHAGEAL ECHOCARDIOGRAM (TEE);  Surgeon: Dorothy Spark, MD;  Location: [facility] ENDOSCOPY;  Service: Cardiovascular;  Laterality: N/A;    Current Outpatient Medications  Medication Sig Dispense Refill  . apixaban (ELIQUIS) 5 MG TABS tablet Take 1 tablet (5 mg total) by mouth 2 (two) times daily. 180 tablet 1  . diltiazem (CARDIZEM CD) 300 MG 24 hr capsule Take 1 capsule (300 mg total) by mouth daily. 90 capsule 1  . lisinopril (ZESTRIL) 5  MG tablet Take 1 tablet (5 mg total) by mouth daily. 90 tablet 3   No current facility-administered medications for this encounter.    Allergies  Allergen Reactions  . Tensilon [Edrophonium] Other (See Comments)    Blacked out     Social History   Socioeconomic History  . Marital status: Divorced    Spouse name: Not on file  . Number of children: 1  . Years of education: 70  . Highest education level: Not on file  Occupational History  . Not on file  Tobacco Use  . Smoking status: Current Every Day Smoker    Packs/day: 0.05    Types: Cigarettes  . Smokeless tobacco: Never Used  . Tobacco comment: 5 cigarettes per day  Substance and Sexual Activity  . Alcohol use: Not Currently    Alcohol/week: 0.0 standard drinks    Comment: Occasional social  . Drug use: No  . Sexual activity: Not on file  Other Topics Concern  . Not on file  Social History Narrative  . Not on file   Social Determinants of Health   Financial Resource Strain:   . Difficulty of Paying Living Expenses:   Food Insecurity:   . Worried About Charity fundraiser in the Last Year:   . Arboriculturist in the Last Year:   Transportation Needs:   . Film/video editor (Medical):   Marland Kitchen Lack of Transportation (Non-Medical):   Physical Activity:   . Days of Exercise per Week:   . Minutes of Exercise per Session:   Stress:   . Feeling of Stress :   Social Connections:   . Frequency of Communication with Friends and Family:   . Frequency of Social Gatherings with Friends and Family:   . Attends Religious Services:   . Active Member of Clubs or Organizations:   . Attends Archivist Meetings:   Marland Kitchen Marital Status:   Intimate Partner Violence:   . Fear of Current or Ex-Partner:   . Emotionally Abused:   Marland Kitchen Physically Abused:   . Sexually Abused:      ROS- All systems are reviewed and negative except as per the HPI above.  Physical Exam: Vitals:   10/23/19 0839  BP: 140/86  Pulse: 65    Weight: 68.5 kg  Height: 5\' 9"  (1.753 m)    GEN- The patient is well appearing, alert and oriented x 3 today.   HEENT-head normocephalic, atraumatic, sclera clear, conjunctiva pink, hearing intact, trachea midline. Lungs- Clear to ausculation bilaterally, normal work of breathing Heart- Regular rate and rhythm, no rubs or gallops. 2/6 systolic murmur GI- soft, NT, ND, + BS Extremities- no clubbing, cyanosis, or edema MS- no significant deformity or atrophy Skin- no rash or lesion Psych- euthymic mood, full affect Neuro- strength and sensation are intact   Wt Readings from Last 3 Encounters:  10/23/19 68.5 kg  09/06/19 67.6 kg  04/26/19 69.2 kg    EKG today demonstrates SR HR  65, LAFB, NST, PR 200, QRS 104, QTc 430  Echo 04/17/19 demonstrated  1. The left ventricle has normal systolic function, with an ejection fraction of 55-60%. The cavity size was normal. There is moderate concentric left ventricular hypertrophy. Left ventricular diastolic Doppler parameters are consistent with  pseudonormalization. Elevated left atrial and left ventricular end-diastolic pressures The E/e' is 24.  2. The right ventricle has normal systolic function. The cavity was normal. There is no increase in right ventricular wall thickness. Right ventricular systolic pressure is normal with an estimated pressure of 20.4 mmHg.  3. Left atrial size was mildly dilated.  4. A commisuroplasty valve is present in the mitral position. Procedure Date: 03/25/1999.  5. Mitral valve regurgitation is mild to moderate by color flow Doppler. Mild mitral valve stenosis.  6. The aortic valve is tricuspid. No stenosis of the aortic valve.  7. The aorta is normal unless otherwise noted.  8. The aortic root, ascending aorta and aortic arch are normal in size and structure.  9. History of Congenital Heart disease History of AV canal defect with reported ostium primum ASD, VSD, cleft mitral valve s/p repair of ASD, VSD and  repair of cleft mitral valve with commisuroplasty. No residual shunting seen.  Epic records are reviewed at length today  Assessment and Plan:  1. Atrial flutter Patient appears to be maintaining SR. Rare, brief palpitations.  Continue diltiazem to 300 mg daily Continue Eliquis 5 mg BID Per Dr [name], would consider AAD or ablation if arrhythmia becomes sustained.   This patients CHA2DS2-VASc Score and unadjusted Ischemic Stroke Rate (% per year) is equal to 2.2 % stroke rate/year from a score of 2  Above score calculated as 1 point each if present [CHF, HTN, DM, Vascular=MI/PAD/Aortic Plaque, Age if 65-74, or Male] Above score calculated as 2 points each if present [Age > 75, or Stroke/TIA/TE]  2. HTN Stable, no changes today.  3. S/p MVR Recent echo showed mild-mod MR Followed by Dr [name].   Follow up with Dr [name] in 3 months. AF clinic as needed.    [facility] [address] [place], [place] [address] [phone] 10/23/2019 8:55 AM

## 2019-10-30 ENCOUNTER — Ambulatory Visit: Payer: Self-pay | Admitting: Interventional Cardiology

## 2020-01-21 NOTE — Progress Notes (Signed)
Cardiology Office Note   Date:  01/22/2020   ID:  Terry Golden., DOB [birth year], MRN [id number]  PCP:  Lanae Boast, FNP    No chief complaint on file.  Atrial flutter  Wt Readings from Last 3 Encounters:  01/22/20 150 lb (68 kg)  10/23/19 151 lb (68.5 kg)  09/06/19 149 lb (67.6 kg)       History of Present Illness: Terry Golden. is a 54 y.o. male  with a history of atrial flutter, rheumatic fever,congenital heart disease withpossible ostium primumASD, small residual VSD and severe MR with acleft mitral valve,status post atrioventricular septal defect repair and mitral valve repair at [facility] in 2000,tobacco use. Cardiac catheterization in 2017 demonstrated nonobstructive coronary artery diseasewho presents [facility]. Patient was admitted on 12/04/18 with atrial flutter with RVR and acute CHF. EF was 40% and was felt to be tachycardia mediated. Seen by Dr [name] 12/12/18 who recommend continuing diltiazem.  TEE/CV in 12/2018.  04/2019 echo showed improved LV function and mild to moderate MR; no residual intracardiac shunt visualized.  Since the last visit, he has felt well.  He walks daily, but not specifically for exercise.  He has not had his vaccines, or COVID.  Denies : Chest pain. Dizziness. Leg edema. Nitroglycerin use. Orthopnea. Palpitations. Paroxysmal nocturnal dyspnea. Shortness of breath. Syncope.     Past Medical History:  Diagnosis Date  . ASD (atrial septal defect)    large   . Congenital heart disease    ASVD, Cleft Mitral Valve  . Echocardiogram    Echo 01/2019: EF 45-50, mod basal septal hypertrophy, Gr 2 DD, s/p VSD closure with no residual shunt,s/p MV ring with mod MR, s/p ASD closure with no residual shunt  . 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 Mitral valve disease 03/25/1999   s/p mitral valve repair with commissuroplasty   . Rheumatic fever   . Syncope    1999  . Typical  atrial flutter ([place])   . VSD (ventricular septal defect)    small    Past Surgical History:  Procedure Laterality Date  . CARDIAC CATHETERIZATION N/A 09/20/2015   Procedure: Right/Left Heart Cath and Coronary Angiography;  Surgeon: Jettie Booze, MD;  Location: [place] [facility];  Service: Cardiovascular;  Laterality: N/A;  . CARDIAC SURGERY     s/p partial AVSD repair and cleft mitral valve repair with commissuroplasty  . CARDIAC VALVE REPLACEMENT    . CARDIOVERSION N/A 12/07/2018   Procedure: CARDIOVERSION;  Surgeon: Dorothy Spark, MD;  Location: [place];  Service: Cardiovascular;  Laterality: N/A;  . TEE WITHOUT CARDIOVERSION N/A 12/07/2018   Procedure: TRANSESOPHAGEAL ECHOCARDIOGRAM (TEE);  Surgeon: Dorothy Spark, MD;  Location: [facility] ENDOSCOPY;  Service: Cardiovascular;  Laterality: N/A;     Current Outpatient Medications  Medication Sig Dispense Refill  . apixaban (ELIQUIS) 5 MG TABS tablet Take 1 tablet (5 mg total) by mouth 2 (two) times daily. 180 tablet 1  . diltiazem (CARDIZEM CD) 300 MG 24 hr capsule Take 1 capsule (300 mg total) by mouth daily. 90 capsule 1  . lisinopril (ZESTRIL) 5 MG tablet Take 1 tablet (5 mg total) by mouth daily. 90 tablet 3   No current facility-administered medications for this visit.    Allergies:   Tensilon [edrophonium]    Social History:  The patient  reports that he has been smoking cigarettes. He has been smoking about 0.05 packs  per day. He has never used smokeless tobacco. He reports previous alcohol use. He reports that he does not use drugs.   Family History:  The patient's family history includes Alcohol abuse in an other family member; Arthritis in some other family members; Diabetes in his sister; Heart attack in his father; Heart disease in his sister and other family members; Hypertension in an other family member; Kidney disease in an other family member; Mental illness in his sister and another family member; Stroke  in his father and another family member.    ROS:  Please see the history of present illness.   Otherwise, review of systems are positive for difficulty stopping smoking.   All other systems are reviewed and negative.    PHYSICAL EXAM: VS:  BP 134/78   Pulse (!) 53   Ht 5\' 9"  (1.753 m)   Wt 150 lb (68 kg)   SpO2 97%   BMI 22.15 kg/m  , BMI Body mass index is 22.15 kg/m. GEN: Well nourished, well developed, in no acute distress  HEENT: normal  Neck: no JVD, carotid bruits, or masses Cardiac: RRR; 2/6 systolic murmur, no rubs, or gallops,no edema  Respiratory:  clear to auscultation bilaterally, normal work of breathing GI: soft, nontender, nondistended, + BS MS: no deformity or atrophy  Skin: warm and dry, no rash Neuro:  Strength and sensation are intact Psych: euthymic mood, full affect   EKG:   The ekg ordered in 3/21 demonstrates NSR, T wave inversions.   Recent Labs: 09/06/2019: BUN 10; Creatinine, Ser 0.95; Hemoglobin 12.1; Magnesium 2.1; Platelets 285; Potassium 4.7; Sodium 142   Lipid Panel    Component Value Date/Time   CHOL 161 12/04/2018 0859   CHOL 176 09/05/2018 1000   TRIG 53 12/04/2018 0859   HDL 43 12/04/2018 0859   HDL 54 09/05/2018 1000   CHOLHDL 3.7 12/04/2018 0859   VLDL 11 12/04/2018 0859   LDLCALC 107 (H) 12/04/2018 0859   LDLCALC 113 (H) 09/05/2018 1000     Other studies Reviewed: Additional studies/ records that were reviewed today with results demonstrating: stable renal function in 2/21.   ASSESSMENT AND PLAN:  1. Atrial flutter: Eliquis for stroke prevention.  Consider antiarrhythmic drug if he had a recurrence of arrhythmia. No bleeding problems.  2. Hypertension: The current medical regimen is effective;  continue present plan and medications. 3. Tobacco abuse: He needs to stop smoking.  Cutting back.  Can use 14 mg patch. Smoking 5 -10 cigs /day. 4. Chronic systolic heart failure: Improved.  Plan for echo in late 2021.  This patients  CHA2DS2-VASc Score and unadjusted Ischemic Stroke Rate (% per year) is equal to 2.2 % stroke rate/year from a score of 2  Above score calculated as 1 point each if present [CHF, HTN, DM, Vascular=MI/PAD/Aortic Plaque, Age if 65-74, or Male] Above score calculated as 2 points each if present [Age > 75, or Stroke/TIA/TE] He prefers to go to [facility] for vacccine.    Current medicines are reviewed at length with the patient today.  The patient concerns regarding his medicines were addressed.  The following changes have been made:  No change  Labs/ tests ordered today include:  No orders of the defined types were placed in this encounter.   Recommend 150 minutes/week of aerobic exercise Low fat, low carb, high fiber diet recommended  Disposition:   FU in 1 year   Signed, [name], MD  01/22/2020 8:37 AM    [facility] [place], [place], [place]  [address] Phone: [phone]; Fax: [phone]

## 2020-01-22 ENCOUNTER — Ambulatory Visit (INDEPENDENT_AMBULATORY_CARE_PROVIDER_SITE_OTHER): Payer: Self-pay | Admitting: Interventional Cardiology

## 2020-01-22 ENCOUNTER — Other Ambulatory Visit: Payer: Self-pay

## 2020-01-22 ENCOUNTER — Encounter: Payer: Self-pay | Admitting: Interventional Cardiology

## 2020-01-22 VITALS — BP 134/78 | HR 53 | Ht 69.0 in | Wt 150.0 lb

## 2020-01-22 DIAGNOSIS — I484 Atypical atrial flutter: Secondary | ICD-10-CM

## 2020-01-22 DIAGNOSIS — I5022 Chronic systolic (congestive) heart failure: Secondary | ICD-10-CM

## 2020-01-22 DIAGNOSIS — I059 Rheumatic mitral valve disease, unspecified: Secondary | ICD-10-CM

## 2020-01-22 DIAGNOSIS — F17200 Nicotine dependence, unspecified, uncomplicated: Secondary | ICD-10-CM

## 2020-01-22 DIAGNOSIS — Q249 Congenital malformation of heart, unspecified: Secondary | ICD-10-CM

## 2020-01-22 DIAGNOSIS — I272 Pulmonary hypertension, unspecified: Secondary | ICD-10-CM

## 2020-01-22 MED ORDER — NICOTINE 14 MG/24HR TD PT24: 14.0000 mg | MEDICATED_PATCH | Freq: Every day | TRANSDERMAL | 0 refills | Status: DC

## 2020-01-22 NOTE — Patient Instructions (Signed)
Medication Instructions:  Your physician has recommended you make the following change in your medication:   START: nicoderm 14 mg patch for smoking cessation: Use as directed  *If you need a refill on your cardiac medications before your next appointment, please call your pharmacy*   Lab Work: None  If you have labs (blood work) drawn today and your tests are completely normal, you will receive your results only by: Marland Kitchen MyChart Message (if you have MyChart) OR . A paper copy in the mail If you have any lab test that is abnormal or we need to change your treatment, we will call you to review the results.   Testing/Procedures: None   Follow-Up: At [facility], you and your health needs are our priority.  As part of our continuing mission to provide you with exceptional heart care, we have created designated Provider Care Teams.  These Care Teams include your primary Cardiologist (physician) and Advanced Practice Providers (APPs -  Physician Assistants and Nurse Practitioners) who all work together to provide you with the care you need, when you need it.  We recommend signing up for the patient portal called "MyChart".  Sign up information is provided on this After Visit Summary.  MyChart is used to connect with patients for Virtual Visits (Telemedicine).  Patients are able to view lab/test results, encounter notes, upcoming appointments, etc.  Non-urgent messages can be sent to your provider as well.   To learn more about what you can do with MyChart, go to [email or web address].    Your next appointment:   6 month(s)  The format for your next appointment:   In Person  Provider:   You may see [name], MD or one of the following Advanced Practice Providers on your designated Care Team:    Melina Copa, PA-C  Ermalinda Barrios, PA-C    Other Instructions None

## 2020-02-06 ENCOUNTER — Other Ambulatory Visit: Payer: Self-pay

## 2020-02-06 MED ORDER — APIXABAN 5 MG PO TABS: 5.0000 mg | ORAL_TABLET | Freq: Two times a day (BID) | ORAL | 1 refills | Status: DC

## 2020-02-06 NOTE — Telephone Encounter (Signed)
Prescription refill request for Eliquis received. Indication: Atrial Fibrillation Last office visit: 01/22/2020 Scr: 0.95 09/06/2019 Age: 54 Weight: 68 kg  Prescription refilled

## 2020-02-12 ENCOUNTER — Encounter (HOSPITAL_COMMUNITY): Payer: Self-pay

## 2020-02-12 ENCOUNTER — Emergency Department (HOSPITAL_COMMUNITY): Payer: Self-pay

## 2020-02-12 ENCOUNTER — Other Ambulatory Visit: Payer: Self-pay

## 2020-02-12 ENCOUNTER — Emergency Department (HOSPITAL_COMMUNITY)
Admission: EM | Admit: 2020-02-12 | Discharge: 2020-02-13 | Disposition: A | Discharge status: Home / Self Care | Payer: Self-pay | Attending: Emergency Medicine | Admitting: Emergency Medicine

## 2020-02-12 DIAGNOSIS — Y9389 Activity, other specified: Secondary | ICD-10-CM | POA: Insufficient documentation

## 2020-02-12 DIAGNOSIS — N179 Acute kidney failure, unspecified: Secondary | ICD-10-CM | POA: Insufficient documentation

## 2020-02-12 DIAGNOSIS — Z955 Presence of coronary angioplasty implant and graft: Secondary | ICD-10-CM | POA: Insufficient documentation

## 2020-02-12 DIAGNOSIS — N3000 Acute cystitis without hematuria: Secondary | ICD-10-CM | POA: Insufficient documentation

## 2020-02-12 DIAGNOSIS — W1830XA Fall on same level, unspecified, initial encounter: Secondary | ICD-10-CM | POA: Insufficient documentation

## 2020-02-12 DIAGNOSIS — R55 Syncope and collapse: Secondary | ICD-10-CM | POA: Insufficient documentation

## 2020-02-12 DIAGNOSIS — I483 Typical atrial flutter: Secondary | ICD-10-CM | POA: Insufficient documentation

## 2020-02-12 DIAGNOSIS — Z7901 Long term (current) use of anticoagulants: Secondary | ICD-10-CM | POA: Insufficient documentation

## 2020-02-12 DIAGNOSIS — S2232XA Fracture of one rib, left side, initial encounter for closed fracture: Secondary | ICD-10-CM | POA: Insufficient documentation

## 2020-02-12 DIAGNOSIS — S0990XA Unspecified injury of head, initial encounter: Secondary | ICD-10-CM | POA: Insufficient documentation

## 2020-02-12 DIAGNOSIS — F17210 Nicotine dependence, cigarettes, uncomplicated: Secondary | ICD-10-CM | POA: Insufficient documentation

## 2020-02-12 DIAGNOSIS — Y999 Unspecified external cause status: Secondary | ICD-10-CM | POA: Insufficient documentation

## 2020-02-12 DIAGNOSIS — Y929 Unspecified place or not applicable: Secondary | ICD-10-CM | POA: Insufficient documentation

## 2020-02-12 LAB — CBC
HCT: 45.3 % (ref 39.0–52.0)
Hemoglobin: 15.1 g/dL (ref 13.0–17.0)
MCH: 32.2 pg (ref 26.0–34.0)
MCHC: 33.3 g/dL (ref 30.0–36.0)
MCV: 96.6 fL (ref 80.0–100.0)
Platelets: 292 10*3/uL (ref 150–400)
RBC: 4.69 MIL/uL (ref 4.22–5.81)
RDW: 11.9 % (ref 11.5–15.5)
WBC: 10.7 10*3/uL — ABNORMAL HIGH (ref 4.0–10.5)
nRBC: 0 % (ref 0.0–0.2)

## 2020-02-12 LAB — URINALYSIS, ROUTINE W REFLEX MICROSCOPIC
Bilirubin Urine: NEGATIVE
Glucose, UA: NEGATIVE mg/dL
Hgb urine dipstick: NEGATIVE
Ketones, ur: NEGATIVE mg/dL
Nitrite: NEGATIVE
Protein, ur: 30 mg/dL — AB
Specific Gravity, Urine: 1.027 (ref 1.005–1.030)
pH: 5 (ref 5.0–8.0)

## 2020-02-12 LAB — CBG MONITORING, ED: Glucose-Capillary: 113 mg/dL — ABNORMAL HIGH (ref 70–99)

## 2020-02-12 LAB — BASIC METABOLIC PANEL
Anion gap: 12 (ref 5–15)
BUN: 28 mg/dL — ABNORMAL HIGH (ref 6–20)
CO2: 26 mmol/L (ref 22–32)
Calcium: 9.8 mg/dL (ref 8.9–10.3)
Chloride: 99 mmol/L (ref 98–111)
Creatinine, Ser: 1.8 mg/dL — ABNORMAL HIGH (ref 0.61–1.24)
GFR calc Af Amer: 48 mL/min — ABNORMAL LOW (ref >60)
GFR calc non Af Amer: 42 mL/min — ABNORMAL LOW (ref >60)
Glucose, Bld: 109 mg/dL — ABNORMAL HIGH (ref 70–99)
Potassium: 4 mmol/L (ref 3.5–5.1)
Sodium: 137 mmol/L (ref 135–145)

## 2020-02-12 MED ORDER — SODIUM CHLORIDE 0.9% FLUSH
3.0000 mL | Freq: Once | INTRAVENOUS | Status: AC
  Administered 2020-02-12: 3 mL via INTRAVENOUS

## 2020-02-12 MED ORDER — SODIUM CHLORIDE 0.9 % IV BOLUS
1000.0000 mL | Freq: Once | INTRAVENOUS | Status: AC
  Administered 2020-02-12: 1000 mL via INTRAVENOUS

## 2020-02-12 MED ORDER — SODIUM CHLORIDE 0.9 % IV BOLUS
1000.0000 mL | Freq: Once | INTRAVENOUS | Status: AC
  Administered 2020-02-13: 1000 mL via INTRAVENOUS

## 2020-02-12 NOTE — ED Notes (Signed)
Patient transported to X-ray 

## 2020-02-12 NOTE — ED Provider Notes (Signed)
[facility] DEPT Provider Note   CSN: [id number] Arrival date & time: 02/12/20  1728     History Chief Complaint  Patient presents with  . syncopal episode  . Fall    Terry Golden. is a 54 y.o. male.  Terry Golden. is a 54 y.o. male with a history of congenital ASD, mitral valve disease s/p repair, rheumatic fever, atrial flutter on anticoagulation, who presents to the emergency department for evaluation after a syncopal event that occurred earlier this afternoon.  He states that he had been out in the heat mowing his lawn, came in to have something to eat and lay down for a few minutes began to feel hot and lightheaded, stood up and then passed out striking his chin and left side on the wall.  Patient is currently on Eliquis for atrial flutter, but states that he has not been in atrial flutter for a long time ever since they shocked him out of it.  He did not have any palpitations, chest pain or shortness of breath preceding or after the syncopal episode today.  Reports now he feels back at baseline no longer feels lightheaded or hot sweats.  No recent fevers or illness.  No abdominal pain, nausea or vomiting.  States he has been trying to drink enough water but was out in the heat for a long time today.  Had 1 prior syncopal episode with similar prodrome.  Reports mild headache after falling and hitting his head and a small laceration underneath the chin.  Is not sure when his last tetanus vaccination was, but chart review shows it is UTD.  Also complains of some pain over the left lower ribs.  Pain is worse with movement, palpation and breathing.  He has not noted any bruising in this area.  Denies pain elsewhere.  No swelling or pain over the extremities.  No meds prior to arrival.  He reports that the laceration under his chin is already started to heal, denies any continued bleeding.        Past Medical History:  Diagnosis Date  . ASD (atrial  septal defect)    large   . Congenital heart disease    ASVD, Cleft Mitral Valve  . Echocardiogram    Echo 01/2019: EF 45-50, mod basal septal hypertrophy, Gr 2 DD, s/p VSD closure with no residual shunt,s/p MV ring with mod MR, s/p ASD closure with no residual shunt  . 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 Mitral valve disease 03/25/1999   s/p mitral valve repair with commissuroplasty   . Rheumatic fever   . Syncope    1999  . Typical atrial flutter ([place])   . VSD (ventricular septal defect)    small    Patient Active Problem List   Diagnosis Date Noted  . Secondary hypercoagulable state ([place]) 10/23/2019  . Atypical atrial flutter ([place])   . Acute diastolic CHF (congestive heart failure), NYHA class 3 ([place])   . Atrial fibrillation with RVR ([place]) 12/04/2018  . Mitral valve disorder   . Shortness of breath 07/16/2015  . Dyspnea 07/16/2015  . Positional lightheadedness 07/16/2015  . Pain in the chest   . Weight loss 03/17/2012  . Smoker 03/17/2012  . 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  . ASD (atrial septal defect)   . VSD (ventricular septal defect)   . Mitral valve disease   . Syncope   . Congenital heart disease   . Preventative health  care 03/11/2012    Past Surgical History:  Procedure Laterality Date  . CARDIAC CATHETERIZATION N/A 09/20/2015   Procedure: Right/Left Heart Cath and Coronary Angiography;  Surgeon: Jettie Booze, MD;  Location: [facility];  Service: Cardiovascular;  Laterality: N/A;  . CARDIAC SURGERY     s/p partial AVSD repair and cleft mitral valve repair with commissuroplasty  . CARDIAC VALVE REPLACEMENT    . CARDIOVERSION N/A 12/07/2018   Procedure: CARDIOVERSION;  Surgeon: Dorothy Spark, MD;  Location: [facility];  Service: Cardiovascular;  Laterality: N/A;  . TEE WITHOUT CARDIOVERSION N/A 12/07/2018   Procedure: TRANSESOPHAGEAL ECHOCARDIOGRAM (TEE);  Surgeon: Dorothy Spark, MD;  Location: [facility];  Service: Cardiovascular;   Laterality: N/A;       Family History  Problem Relation Age of Onset  . Diabetes Sister   . Mental illness Sister   . Heart disease Sister   . Arthritis Other   . Hypertension Other   . Alcohol abuse Other   . Heart attack Father   . Stroke Father   . Heart disease Other   . Arthritis Other   . Heart disease Other   . Stroke Other   . Mental illness Other   . Kidney disease Other     Social History   Tobacco Use  . Smoking status: Current Every Day Smoker    Packs/day: 0.05    Types: Cigarettes  . Smokeless tobacco: Never Used  . Tobacco comment: 5 cigarettes per day  Vaping Use  . Vaping Use: Never used  Substance Use Topics  . Alcohol use: Not Currently    Alcohol/week: 0.0 standard drinks    Comment: Occasional social  . Drug use: No    Home Medications Prior to Admission medications   Medication Sig Start Date End Date Taking? Authorizing Provider  apixaban (ELIQUIS) 5 MG TABS tablet Take 1 tablet (5 mg total) by mouth 2 (two) times daily. 02/06/20   Jettie Booze, MD  diltiazem (CARDIZEM CD) 300 MG 24 hr capsule Take 1 capsule (300 mg total) by mouth daily. 10/12/19   Fenton, Clint R, PA  lisinopril (ZESTRIL) 5 MG tablet Take 1 tablet (5 mg total) by mouth daily. 03/10/19   Jettie Booze, MD  nicotine (NICODERM CQ) 14 mg/24hr patch Place 1 patch (14 mg total) onto the skin daily. 01/22/20   Jettie Booze, MD    Allergies    Tensilon [edrophonium]  Review of Systems   Review of Systems  Constitutional: Negative for chills and fever.  HENT: Negative.   Eyes: Negative for visual disturbance.  Respiratory: Negative for cough and shortness of breath.   Cardiovascular: Negative for chest pain, palpitations and leg swelling.  Gastrointestinal: Negative for abdominal pain, nausea and vomiting.  Genitourinary: Negative for dysuria, flank pain, frequency and hematuria.  Musculoskeletal: Positive for back pain and myalgias. Negative for  arthralgias and neck pain.  Skin: Positive for wound.  Neurological: Positive for syncope and light-headedness. Negative for seizures, weakness, numbness and headaches.  All other systems reviewed and are negative.   Physical Exam Updated Vital Signs BP 126/82 (BP Location: Right Arm)   Pulse 87   Temp 98 F (36.7 C) (Oral)   Resp 18   Ht 5\' 9"  (1.753 m)   Wt 68 kg   SpO2 99%   BMI 22.15 kg/m   Physical Exam Vitals and nursing note reviewed.  Constitutional:      General: He is not in  acute distress.    Appearance: Normal appearance. He is well-developed. He is not diaphoretic.  HENT:     Head: Normocephalic.     Comments: Mild tenderness over the left side of the scalp without palpable hematoma, deformity or step-off.  No CSF otorrhea or hemotympanum    Mouth/Throat:     Comments: Small laceration on the underside of the chin but no bony tenderness over the jaw or malocclusion of the jaw, laceration is already scabbed over and healing Eyes:     General:        Right eye: No discharge.        Left eye: No discharge.     Extraocular Movements: Extraocular movements intact.     Pupils: Pupils are equal, round, and reactive to light.  Cardiovascular:     Rate and Rhythm: Normal rate and regular rhythm.     Pulses: Normal pulses.     Heart sounds: Normal heart sounds. No murmur heard.  No gallop.   Pulmonary:     Effort: Pulmonary effort is normal. No respiratory distress.     Breath sounds: Normal breath sounds. No wheezing or rales.     Comments: Respirations equal and unlabored, patient able to speak in full sentences, lungs clear to auscultation bilaterally, tenderness over the left lower lateral ribs without palpable deformity or crepitus Abdominal:     General: Bowel sounds are normal. There is no distension.     Palpations: Abdomen is soft. There is no mass.     Tenderness: There is no abdominal tenderness. There is no guarding.     Comments: Abdomen soft,  nondistended, nontender to palpation in all quadrants without guarding or peritoneal signs  Musculoskeletal:        General: No deformity.     Cervical back: Neck supple.     Right lower leg: No edema.     Left lower leg: No edema.     Comments: T-spine and L-spine nontender to palpation at midline. Patient moves all extremities without difficulty. All joints supple and easily movable, no erythema, swelling or palpable deformity, all compartments soft.  Skin:    General: Skin is warm and dry.     Capillary Refill: Capillary refill takes less than 2 seconds.  Neurological:     Mental Status: He is alert and oriented to person, place, and time.     Coordination: Coordination normal.     Comments: Speech is clear, able to follow commands CN III-XII intact Normal strength in upper and lower extremities bilaterally including dorsiflexion and plantar flexion, strong and equal grip strength Sensation normal to light and sharp touch Moves extremities without ataxia, coordination intact   Psychiatric:        Mood and Affect: Mood normal.        Behavior: Behavior normal.     ED Results / Procedures / Treatments   Labs (all labs ordered are listed, but only abnormal results are displayed) Labs Reviewed  BASIC METABOLIC PANEL - Abnormal; Notable for the following components:      Result Value   Glucose, Bld 109 (*)    BUN 28 (*)    Creatinine, Ser 1.80 (*)    GFR calc non Af Amer 42 (*)    GFR calc Af Amer 48 (*)    All other components within normal limits  CBC - Abnormal; Notable for the following components:   WBC 10.7 (*)    All other components within normal limits  URINALYSIS,  ROUTINE W REFLEX MICROSCOPIC - Abnormal; Notable for the following components:   Color, Urine AMBER (*)    Protein, ur 30 (*)    Leukocytes,Ua SMALL (*)    Bacteria, UA RARE (*)    All other components within normal limits  BASIC METABOLIC PANEL - Abnormal; Notable for the following components:    Glucose, Bld 177 (*)    BUN 23 (*)    Creatinine, Ser 1.34 (*)    Calcium 8.3 (*)    GFR calc non Af Amer 60 (*)    All other components within normal limits  CBG MONITORING, ED - Abnormal; Notable for the following components:   Glucose-Capillary 113 (*)    All other components within normal limits  URINE CULTURE  CK    EKG EKG Interpretation  Date/Time:  Monday February 12 2020 18:00:13 EDT Ventricular Rate:  83 PR Interval:    QRS Duration: 106 QT Interval:  372 QTC Calculation: 438 R Axis:   -74 Text Interpretation: Sinus rhythm Biatrial enlargement Left anterior fascicular block Left ventricular hypertrophy inferior t wave changes resolved since last tracing Confirmed by Dorie Rank [phone]) on 02/12/2020 8:45:27 PM   Radiology DG Ribs Unilateral W/Chest Left  Result Date: 02/12/2020 CLINICAL DATA:  Post fall with left rib pain. Syncopal episode today striking wall. EXAM: LEFT RIBS AND CHEST - 3+ VIEW COMPARISON:  Radiograph 12/04/2018 FINDINGS: Nondisplaced fracture of lateral left ninth rib. There is no evidence of pneumothorax or pleural effusion. Both lungs are clear. Heart size and mediastinal contours are within normal limits. Post median sternotomy. IMPRESSION: Nondisplaced lateral left ninth rib fracture. No pulmonary complication. Electronically Signed   By: Keith Rake M.D.   On: 02/12/2020 23:21   CT Head Wo Contrast  Result Date: 02/12/2020 CLINICAL DATA:  54 year old male with syncopal episode today, fall striking head/face. On Eliquis. EXAM: CT HEAD WITHOUT CONTRAST TECHNIQUE: Contiguous axial images were obtained from the base of the skull through the vertex without intravenous contrast. COMPARISON:  Head CT without contrast 01/26/2010. FINDINGS: Brain: Cerebral volume remains normal. No midline shift, ventriculomegaly, mass effect, evidence of mass lesion, intracranial hemorrhage or evidence of cortically based acute infarction. Gray-white matter differentiation is  within normal limits throughout the brain. Vascular: No suspicious intracranial vascular hyperdensity. Skull: Stable, intact. Sinuses/Orbits: Substantially improved bilateral sinus aeration since 2011. Mild residual ethmoid mucosal thickening. Tympanic cavities and mastoids remain clear. Other: No orbit or scalp soft tissue injury identified. IMPRESSION: 1. No acute traumatic injury identified. Stable and normal noncontrast CT appearance of the brain. 2. Largely resolved paranasal sinus disease since 2011. Electronically Signed   By: Genevie Ann M.D.   On: 02/12/2020 23:39   CT Chest W Contrast  Result Date: 02/13/2020 CLINICAL DATA:  54 year old male with abdominal trauma. EXAM: CT CHEST, ABDOMEN, AND PELVIS WITH CONTRAST TECHNIQUE: Multidetector CT imaging of the chest, abdomen and pelvis was performed following the standard protocol during bolus administration of intravenous contrast. CONTRAST:  141mL OMNIPAQUE IOHEXOL 300 MG/ML  SOLN COMPARISON:  Chest radiograph dated 12/04/2018. FINDINGS: CT CHEST FINDINGS Cardiovascular: Top-normal cardiac size. Three vessel coronary vascular calcification. No pericardial effusion. The thoracic aorta is unremarkable. The origins of the great vessels of the aortic arch appear patent as visualized. Mild prominence of the central pulmonary arteries may represent a degree of pulmonary hypertension. Clinical correlation is recommended. The central pulmonary arteries appear patent as visualized. Mediastinum/Nodes: No hilar or mediastinal adenopathy. The esophagus and the thyroid gland are grossly unremarkable.  No mediastinal fluid collection. Lungs/Pleura: There is centrilobular and paraseptal emphysema. No focal consolidation, pleural effusion, or pneumothorax. The central airways are patent. Musculoskeletal: Median sternotomy wires. No acute osseous pathology. No displaced rib fractures. CT ABDOMEN PELVIS FINDINGS No intra-abdominal free air or free fluid. Hepatobiliary: No focal  liver abnormality is seen. No gallstones, gallbladder wall thickening, or biliary dilatation. Pancreas: Unremarkable. No pancreatic ductal dilatation or surrounding inflammatory changes. Spleen: Normal in size without focal abnormality. Adrenals/Urinary Tract: Adrenal glands are unremarkable. Kidneys are normal, without renal calculi, focal lesion, or hydronephrosis. Bladder is unremarkable. Stomach/Bowel: There is no bowel obstruction or active inflammation. The appendix is normal. Vascular/Lymphatic: There is mild aortoiliac atherosclerotic disease. Apparent mild edema of the fat plane surrounding the SMA. No fluid collection or hematoma. The IVC is unremarkable. No portal venous gas. There is no adenopathy. Reproductive: The prostate and seminal vesicles are grossly unremarkable. Other: None Musculoskeletal: No acute or significant osseous findings. IMPRESSION: No acute/traumatic intrathoracic, abdominal, or pelvic pathology. Electronically Signed   By: Anner Crete M.D.   On: 02/13/2020 00:41   CT ABDOMEN PELVIS W CONTRAST  Result Date: 02/13/2020 CLINICAL DATA:  54 year old male with abdominal trauma. EXAM: CT CHEST, ABDOMEN, AND PELVIS WITH CONTRAST TECHNIQUE: Multidetector CT imaging of the chest, abdomen and pelvis was performed following the standard protocol during bolus administration of intravenous contrast. CONTRAST:  181mL OMNIPAQUE IOHEXOL 300 MG/ML  SOLN COMPARISON:  Chest radiograph dated 12/04/2018. FINDINGS: CT CHEST FINDINGS Cardiovascular: Top-normal cardiac size. Three vessel coronary vascular calcification. No pericardial effusion. The thoracic aorta is unremarkable. The origins of the great vessels of the aortic arch appear patent as visualized. Mild prominence of the central pulmonary arteries may represent a degree of pulmonary hypertension. Clinical correlation is recommended. The central pulmonary arteries appear patent as visualized. Mediastinum/Nodes: No hilar or mediastinal  adenopathy. The esophagus and the thyroid gland are grossly unremarkable. No mediastinal fluid collection. Lungs/Pleura: There is centrilobular and paraseptal emphysema. No focal consolidation, pleural effusion, or pneumothorax. The central airways are patent. Musculoskeletal: Median sternotomy wires. No acute osseous pathology. No displaced rib fractures. CT ABDOMEN PELVIS FINDINGS No intra-abdominal free air or free fluid. Hepatobiliary: No focal liver abnormality is seen. No gallstones, gallbladder wall thickening, or biliary dilatation. Pancreas: Unremarkable. No pancreatic ductal dilatation or surrounding inflammatory changes. Spleen: Normal in size without focal abnormality. Adrenals/Urinary Tract: Adrenal glands are unremarkable. Kidneys are normal, without renal calculi, focal lesion, or hydronephrosis. Bladder is unremarkable. Stomach/Bowel: There is no bowel obstruction or active inflammation. The appendix is normal. Vascular/Lymphatic: There is mild aortoiliac atherosclerotic disease. Apparent mild edema of the fat plane surrounding the SMA. No fluid collection or hematoma. The IVC is unremarkable. No portal venous gas. There is no adenopathy. Reproductive: The prostate and seminal vesicles are grossly unremarkable. Other: None Musculoskeletal: No acute or significant osseous findings. IMPRESSION: No acute/traumatic intrathoracic, abdominal, or pelvic pathology. Electronically Signed   By: Anner Crete M.D.   On: 02/13/2020 00:41     Procedures Procedures (including critical care time)  Medications Ordered in ED Medications  sodium chloride flush (NS) 0.9 % injection 3 mL (3 mLs Intravenous Given 02/12/20 2336)  sodium chloride 0.9 % bolus 1,000 mL (0 mLs Intravenous Stopped 02/13/20 0219)  sodium chloride 0.9 % bolus 1,000 mL (0 mLs Intravenous Stopped 02/13/20 0223)  iohexol (OMNIPAQUE) 300 MG/ML solution 100 mL (100 mLs Intravenous Contrast Given 02/13/20 0011)    ED Course  I have  reviewed the triage vital signs and  the nursing notes.  Pertinent labs & imaging results that were available during my care of the patient were reviewed by me and considered in my medical decision making (see chart for details).  Clinical Course as of Feb 14 2211  Wed Feb 14, 2020  2211 WBC(!): 10.7 [KF]    Clinical Course User Index [KF] Janet Berlin   MDM Rules/Calculators/A&P                          54 year old male presents after syncopal episode that occurred earlier today after he had been outside mowing the lawn, he had laid down on the couch and then started to feel lightheaded and have hot sweats, stood up and passed out striking his head and the left side of his chest on the wall.  He did not have any preceding chest pain, shortness of breath or palpitations.  Does have a history of heart disease but has had valvular repair and has been doing well since then.  Prior history of a flutter but has not been in this since he was cardioverted.  Patient did sustain a small laceration but this is already healing do not feel that suture repair is warranted.  Will get CT of the head and chest and rib films given left-sided pain.  Patient is on anticoagulation.  Basic labs ordered from triage.  I have independently ordered, reviewed and interpreted all labs and imaging: EKG shows sinus rhythm without concerning changes, previous T wave changes have since resolved CBC: Minimal leukocytosis of 10.7, normal hemoglobin BMP with acute increase in creatinine at 1.8 was previously 0.95, mild elevation of BUN as well, suspect this may be due to dehydration.  UA also with some potential signs of infection with 21-50 WBCs and some rare bacteria, will send for culture and treat with antibiotics.   CK checked due to elevated creatinine, normal  Head CT is unremarkable, but chest x-ray shows nondisplaced ninth rib fracture.  Given location of rib fracture and patient's pain as well as the fact that  he is anticoagulated will get CT of the chest abdomen and pelvis.  CTs of the chest abdomen pelvis show no evidence of acute traumatic injury from fall.  Will treat for rib fracture with tramadol and incentive spirometry, given increasing creatinine patient avoid NSAIDs, after 2 L of IV fluid given in ED patient's creatinine is already showing improvement we will have him follow closely with his primary care doctor and cardiologist will need to have creatinine rechecked in 1 week.  Antibiotics prescribed for potential UTI.  Discharged home in good condition.  Final Clinical Impression(s) / ED Diagnoses Final diagnoses:  Syncope, unspecified syncope type  Injury of head, initial encounter  Closed fracture of one rib of left side, initial encounter  AKI (acute kidney injury) ([place])  Acute cystitis without hematuria    Rx / DC Orders ED Discharge Orders         Ordered    traMADol (ULTRAM) 50 MG tablet  Every 6 hours PRN,   Status:  Discontinued     Reprint     02/13/20 0335    cephALEXin (KEFLEX) 500 MG capsule  3 times daily     Discontinue  Reprint     02/13/20 0335    traMADol (ULTRAM) 50 MG tablet  Every 6 hours PRN     Discontinue  Reprint     02/13/20 0338  Janet Berlin 02/14/20 2213    Dorie Rank, MD 02/19/20 [phone]

## 2020-02-12 NOTE — ED Triage Notes (Addendum)
Patient states he had a syncopal episode today. Patient states he hit the wall. Patient c/o head pain, left rib cage pain and a small cut to the chin.  patient is on eliquis

## 2020-02-13 ENCOUNTER — Emergency Department (HOSPITAL_COMMUNITY): Payer: Self-pay

## 2020-02-13 ENCOUNTER — Other Ambulatory Visit (HOSPITAL_COMMUNITY): Payer: Self-pay

## 2020-02-13 ENCOUNTER — Encounter (HOSPITAL_COMMUNITY): Payer: Self-pay

## 2020-02-13 LAB — BASIC METABOLIC PANEL
Anion gap: 7 (ref 5–15)
BUN: 23 mg/dL — ABNORMAL HIGH (ref 6–20)
CO2: 25 mmol/L (ref 22–32)
Calcium: 8.3 mg/dL — ABNORMAL LOW (ref 8.9–10.3)
Chloride: 105 mmol/L (ref 98–111)
Creatinine, Ser: 1.34 mg/dL — ABNORMAL HIGH (ref 0.61–1.24)
GFR calc Af Amer: >60 mL/min (ref >60)
GFR calc non Af Amer: 60 mL/min — ABNORMAL LOW (ref >60)
Glucose, Bld: 177 mg/dL — ABNORMAL HIGH (ref 70–99)
Potassium: 3.8 mmol/L (ref 3.5–5.1)
Sodium: 137 mmol/L (ref 135–145)

## 2020-02-13 LAB — CK: Total CK: 249 U/L (ref 49–397)

## 2020-02-13 MED ORDER — TRAMADOL HCL 50 MG PO TABS: 50.0000 mg | ORAL_TABLET | Freq: Four times a day (QID) | ORAL | 0 refills | Status: DC | PRN

## 2020-02-13 MED ORDER — SODIUM CHLORIDE (PF) 0.9 % IJ SOLN
INTRAMUSCULAR | Status: AC
  Filled 2020-02-13: qty 50

## 2020-02-13 MED ORDER — CEPHALEXIN 500 MG PO CAPS: 500.0000 mg | ORAL_CAPSULE | Freq: Three times a day (TID) | ORAL | 0 refills | Status: AC

## 2020-02-13 MED ORDER — IOHEXOL 300 MG/ML  SOLN
100.0000 mL | Freq: Once | INTRAMUSCULAR | Status: AC | PRN
  Administered 2020-02-13: 100 mL via INTRAVENOUS

## 2020-02-13 NOTE — ED Notes (Signed)
Patient transported to CT 

## 2020-02-13 NOTE — Discharge Instructions (Signed)
Your evaluation today has been reassuring, we did see evidence of a rib fracture but your CT scans did not show any other injury to the chest abdomen or pelvis.  Your head CT was clear.  Your kidney function had significantly increased today after IV fluids this is already improving but I would like for you to make sure you are drinking plenty of water daily and follow-up with your PCP or cardiologist for recheck of this within the next week.  Please follow-up with Dr. Scarlette Calico regarding this syncopal episode.  If you develop any chest pain, palpitations or shortness of breath or have recurrent syncopal episodes you should return to the emergency department.  Your urine showed signs of possible infection, please take antibiotics.  For rib fracture I prescribed tramadol, you can also take at 1000 mg of Tylenol every 6 hours as needed.  Use incentive spirometer.

## 2020-02-14 LAB — URINE CULTURE: Culture: NO GROWTH

## 2020-03-06 ENCOUNTER — Ambulatory Visit (INDEPENDENT_AMBULATORY_CARE_PROVIDER_SITE_OTHER): Payer: Self-pay | Admitting: Nurse Practitioner

## 2020-03-06 ENCOUNTER — Other Ambulatory Visit: Payer: Self-pay

## 2020-03-06 ENCOUNTER — Encounter: Payer: Self-pay | Admitting: Nurse Practitioner

## 2020-03-06 VITALS — BP 142/78 | HR 54 | Temp 97.9°F | Resp 16 | Ht 69.0 in | Wt 149.0 lb

## 2020-03-06 DIAGNOSIS — I4891 Unspecified atrial fibrillation: Secondary | ICD-10-CM

## 2020-03-06 DIAGNOSIS — Z7901 Long term (current) use of anticoagulants: Secondary | ICD-10-CM

## 2020-03-06 DIAGNOSIS — Z0000 Encounter for general adult medical examination without abnormal findings: Secondary | ICD-10-CM

## 2020-03-06 DIAGNOSIS — Z1211 Encounter for screening for malignant neoplasm of colon: Secondary | ICD-10-CM

## 2020-03-06 NOTE — Patient Instructions (Addendum)
Rib Fracture  A rib fracture is a break or crack in one of the bones of the ribs. The ribs are like a cage that goes around your upper chest. A broken or cracked rib is often painful, but most do not cause other problems. Most rib fractures usually heal on their own in 1-3 months. Follow these instructions at home: Managing pain, stiffness, and swelling  If directed, apply ice to the injured area. ? Put ice in a plastic bag. ? Place a towel between your skin and the bag. ? Leave the ice on for 20 minutes, 2-3 times a day.  Take over-the-counter and prescription medicines only as told by your doctor. Activity  Avoid activities that cause pain to the injured area. Protect your injured area.  Slowly increase activity as told by your doctor. General instructions  Do deep breathing as told by your doctor. You may be told to: ? Take deep breaths many times a day. ? Cough many times a day while hugging a pillow. ? Use a device (incentive spirometer) to do deep breathing many times a day.  Drink enough fluid to keep your pee (urine) clear or pale yellow.  Do not wear a rib belt or binder. These do not allow you to breathe deeply.  Keep all follow-up visits as told by your doctor. This is important. Contact a doctor if:  You have a fever. Get help right away if:  You have trouble breathing.  You are short of breath.  You cannot stop coughing.  You cough up thick or bloody spit (sputum).  You feel sick to your stomach (nauseous), throw up (vomit), or have belly (abdominal) pain.  Your pain gets worse and medicine does not help. Summary  A rib fracture is a break or crack in one of the bones of the ribs.  Apply ice to the injured area and take medicines for pain as told by your doctor.  Take deep breaths and cough many times a day. Hug a pillow every time you cough. This information is not intended to replace advice given to you by your health care provider. Make sure you  discuss any questions you have with your health care provider. Document Revised: 07/02/2017 Document Reviewed: 10/20/2016 Elsevier Patient Education  2020 [employer].  Colonoscopy, Adult A colonoscopy is a procedure to look at the entire large intestine. This procedure is done using a long, thin, flexible tube that has a camera on the end. You may have a colonoscopy:  As a part of normal colorectal screening.  If you have certain symptoms, such as: ? A low number of red blood cells in your blood (anemia). ? Diarrhea that does not go away. ? Pain in your abdomen. ? Blood in your stool. A colonoscopy can help screen for and diagnose medical problems, including:  Tumors.  Extra tissue that grows where mucus forms (polyps).  Inflammation.  Areas of bleeding. Tell your health care provider about:  Any allergies you have.  All medicines you are taking, including vitamins, herbs, eye drops, creams, and over-the-counter medicines.  Any problems you or family members have had with anesthetic medicines.  Any blood disorders you have.  Any surgeries you have had.  Any medical conditions you have.  Any problems you have had with having bowel movements.  Whether you are pregnant or may be pregnant. What are the risks? Generally, this is a safe procedure. However, problems may occur, including:  Bleeding.  Damage to your intestine.  Allergic reactions to medicines given during the procedure.  Infection. This is rare. What happens before the procedure? Eating and drinking restrictions Follow instructions from your health care provider about eating or drinking restrictions, which may include:  A few days before the procedure: ? Follow a low-fiber diet. ? Avoid nuts, seeds, dried fruit, raw fruits, and vegetables.  1-3 days before the procedure: ? Eat only gelatin dessert or ice pops. ? Drink only clear liquids, such as water, clear juice, clear broth or bouillon, black  coffee or tea, or clear soft drinks or sports drinks. ? Avoid liquids that contain red or purple dye.  The day of the procedure: ? Do not eat solid foods. You may continue to drink clear liquids until up to 2 hours before the procedure. ? Do not eat or drink anything starting 2 hours before the procedure, or within the time period that your health care provider recommends. Bowel prep If you were prescribed a bowel prep to take by mouth (orally) to clean out your colon:  Take it as told by your health care provider. Starting the day before your procedure, you will need to drink a large amount of liquid medicine. The liquid will cause you to have many bowel movements of loose stool until your stool becomes almost clear or light green.  If your skin or the opening between the buttocks (anus) gets irritated from diarrhea, you may relieve the irritation using: ? Wipes with medicine in them, such as adult wet wipes with aloe and vitamin E. ? A product to soothe skin, such as petroleum jelly.  If you vomit while drinking the bowel prep: ? Take a break for up to 60 minutes. ? Begin the bowel prep again. ? Call your health care provider if you keep vomiting or you cannot take the bowel prep without vomiting.  To clean out your colon, you may also be given: ? Laxative medicines. These help you have a bowel movement. ? Instructions for enema use. An enema is liquid medicine injected into your rectum. Medicines Ask your health care provider about:  Changing or stopping your regular medicines or supplements. This is especially important if you are taking iron supplements, diabetes medicines, or blood thinners.  Taking medicines such as aspirin and ibuprofen. These medicines can thin your blood. Do not take these medicines unless your health care provider tells you to take them.  Taking over-the-counter medicines, vitamins, herbs, and supplements. General instructions  Ask your health care  provider what steps will be taken to help prevent infection. These may include washing skin with a germ-killing soap.  Plan to have someone take you home from the hospital or clinic. What happens during the procedure?   An IV will be inserted into one of your veins.  You may be given one or more of the following: ? A medicine to help you relax (sedative). ? A medicine to numb the area (local anesthetic). ? A medicine to make you fall asleep (general anesthetic). This is rarely needed.  You will lie on your side with your knees bent.  The tube will: ? Have oil or gel put on it (be lubricated). ? Be inserted into your anus. ? Be gently eased through all parts of your large intestine.  Air will be sent into your colon to keep it open. This may cause some pressure or cramping.  Images will be taken with the camera and will appear on a screen.  A small tissue sample may  be removed to be looked at under a microscope (biopsy). The tissue may be sent to a lab for testing if any signs of problems are found.  If small polyps are found, they may be removed and checked for cancer cells.  When the procedure is finished, the tube will be removed. The procedure may vary among health care providers and hospitals. What happens after the procedure?  Your blood pressure, heart rate, breathing rate, and blood oxygen level will be monitored until you leave the hospital or clinic.  You may have a small amount of blood in your stool.  You may pass gas and have mild cramping or bloating in your abdomen. This is caused by the air that was used to open your colon during the exam.  Do not drive for 24 hours after the procedure.  It is up to you to get the results of your procedure. Ask your health care provider, or the department that is doing the procedure, when your results will be ready. Summary  A colonoscopy is a procedure to look at the entire large intestine.  Follow instructions from your  health care provider about eating and drinking before the procedure.  If you were prescribed an oral bowel prep to clean out your colon, take it as told by your health care provider.  During the colonoscopy, a flexible tube with a camera on its end is inserted into the anus and then passed into the other parts of the large intestine. This information is not intended to replace advice given to you by your health care provider. Make sure you discuss any questions you have with your health care provider. Document Revised: 02/10/2019 Document Reviewed: 02/10/2019 Elsevier Patient Education  [employer].

## 2020-03-06 NOTE — Progress Notes (Signed)
[place], [place]  [address] Phone:  [phone]   Fax:  [phone]    Established Patient Office Visit  Subjective:  Patient ID: Terry Santee., male    DOB: [birth year]  Age: 54 y.o. MRN: [id number]  CC:  Chief Complaint  Patient presents with  . Follow-up    AFIB    HPI Terry Golden. presents for follow up. 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    Patient complains of palpitations. The symptoms are mild, occur intermittently, and last a few seconds per episode. They tend to occur while rarely. Cardiac risk factors include: hypertension, male gender and smoking/ tobacco exposure. Aggravating factors: caffeine. Alleviating factors: spontaneous. Associated symptoms: none. Patient denies: dizziness.   Past Medical History:  Diagnosis Date  . ASD (atrial septal defect)    large   . Congenital heart disease    ASVD, Cleft Mitral Valve  . Echocardiogram    Echo 01/2019: EF 45-50, mod basal septal hypertrophy, Gr 2 DD, s/p VSD closure with no residual shunt,s/p MV ring with mod MR, s/p ASD closure with no residual shunt  . 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 Mitral valve disease 03/25/1999   s/p mitral valve repair with commissuroplasty   . Rheumatic fever   . Syncope    1999  . Typical atrial flutter ([place])   . VSD (ventricular septal defect)    small    Past Surgical History:  Procedure Laterality Date  . CARDIAC CATHETERIZATION N/A 09/20/2015   Procedure: Right/Left Heart Cath and Coronary Angiography;  Surgeon: Jettie Booze, MD;  Location: [facility];  Service: Cardiovascular;  Laterality: N/A;  . CARDIAC SURGERY     s/p partial AVSD repair and cleft mitral valve repair  with commissuroplasty  . CARDIAC VALVE REPLACEMENT    . CARDIOVERSION N/A 12/07/2018   Procedure: CARDIOVERSION;  Surgeon: Dorothy Spark, MD;  Location: [facility];  Service: Cardiovascular;  Laterality: N/A;  . TEE WITHOUT CARDIOVERSION N/A 12/07/2018   Procedure: TRANSESOPHAGEAL ECHOCARDIOGRAM (TEE);  Surgeon: Dorothy Spark, MD;  Location: [facility];  Service: Cardiovascular;  Laterality: N/A;    Family History  Problem Relation Age of Onset  . Diabetes Sister   . Mental illness Sister   . Heart disease Sister   . Arthritis Other   . Hypertension Other   . Alcohol abuse Other   . Heart attack Father   . Stroke Father   . Heart disease Other   . Arthritis Other   . Heart disease Other   . Stroke Other   . Mental illness Other   . Kidney disease Other     Social History   Socioeconomic History  . Marital status: Divorced    Spouse name: Not on file  . Number of children: 1  . Years of education: 23  . Highest education level: Not on file  Occupational History  . Not on file  Tobacco Use  . Smoking status: Current Every Day Smoker    Packs/day: 0.05    Types: Cigarettes  . Smokeless tobacco: Never Used  . Tobacco comment: 5 cigarettes per day  Vaping Use  . Vaping Use: Never used  Substance and Sexual Activity  . Alcohol use:  Not Currently    Alcohol/week: 0.0 standard drinks    Comment: Occasional social  . Drug use: No  . Sexual activity: Not on file  Other Topics Concern  . Not on file  Social History Narrative  . Not on file   Social Determinants of Health   Financial Resource Strain:   . Difficulty of Paying Living Expenses:   Food Insecurity:   . Worried About Charity fundraiser in the Last Year:   . Arboriculturist in the Last Year:   Transportation Needs:   . Film/video editor (Medical):   Marland Kitchen Lack of Transportation (Non-Medical):   Physical Activity:   . Days of Exercise per Week:   . Minutes of Exercise per Session:   Stress:    . Feeling of Stress :   Social Connections:   . Frequency of Communication with Friends and Family:   . Frequency of Social Gatherings with Friends and Family:   . Attends Religious Services:   . Active Member of Clubs or Organizations:   . Attends Archivist Meetings:   Marland Kitchen Marital Status:   Intimate Partner Violence:   . Fear of Current or Ex-Partner:   . Emotionally Abused:   Marland Kitchen Physically Abused:   . Sexually Abused:     Outpatient Medications Prior to Visit  Medication Sig Dispense Refill  . apixaban (ELIQUIS) 5 MG TABS tablet Take 1 tablet (5 mg total) by mouth 2 (two) times daily. 180 tablet 1  . diltiazem (CARDIZEM CD) 300 MG 24 hr capsule Take 1 capsule (300 mg total) by mouth daily. 90 capsule 1  . lisinopril (ZESTRIL) 5 MG tablet Take 1 tablet (5 mg total) by mouth daily. 90 tablet 3  . nicotine (NICODERM CQ) 14 mg/24hr patch Place 1 patch (14 mg total) onto the skin daily. (Patient not taking: Reported on 03/06/2020) 28 patch 0  . traMADol (ULTRAM) 50 MG tablet Take 1 tablet (50 mg total) by mouth every 6 (six) hours as needed. (Patient not taking: Reported on 03/06/2020) 10 tablet 0   No facility-administered medications prior to visit.    Allergies  Allergen Reactions  . Tensilon [Edrophonium] Other (See Comments)    Blacked out     ROS Review of Systems    Objective:    Physical Exam Constitutional:      General: He is not in acute distress.    Appearance: Normal appearance. He is normal weight. He is not ill-appearing, toxic-appearing or diaphoretic.  HENT:     Head: Normocephalic and atraumatic.     Nose: Nose normal.     Mouth/Throat:     Mouth: Mucous membranes are moist.  Cardiovascular:     Rate and Rhythm: Normal rate and regular rhythm.     Pulses: Normal pulses.     Heart sounds: Normal heart sounds.     Comments: Left chest tenderness  Pulmonary:     Effort: Pulmonary effort is normal.     Breath sounds: Normal breath sounds.    Abdominal:     General: Abdomen is flat. Bowel sounds are normal.     Palpations: Abdomen is soft.  Musculoskeletal:        General: Normal range of motion.     Cervical back: Normal range of motion.  Skin:    General: Skin is warm and dry.     Capillary Refill: Capillary refill takes less than 2 seconds.  Neurological:     General: No  focal deficit present.     Mental Status: He is alert and oriented to person, place, and time.  Psychiatric:        Mood and Affect: Mood normal.        Behavior: Behavior normal.        Thought Content: Thought content normal.        Judgment: Judgment normal.     BP (!) 142/78 (BP Location: Left Arm, Patient Position: Sitting, Cuff Size: Normal)   Pulse (!) 54   Temp 97.9 F (36.6 C) (Oral)   Resp 16   Ht 5\' 9"  (1.753 m)   Wt 149 lb (67.6 kg)   SpO2 99%   BMI 22.00 kg/m  Wt Readings from Last 3 Encounters:  03/06/20 149 lb (67.6 kg)  02/12/20 150 lb (68 kg)  01/22/20 150 lb (68 kg)     Health Maintenance Due  Topic Date Due  . INFLUENZA VACCINE  03/03/2020    There are no preventive care reminders to display for this patient.  Lab Results  Component Value Date   TSH 1.391 12/04/2018   Lab Results  Component Value Date   WBC 10.7 (H) 02/12/2020   HGB 15.1 02/12/2020   HCT 45.3 02/12/2020   MCV 96.6 02/12/2020   PLT 292 02/12/2020   Lab Results  Component Value Date   NA 137 02/13/2020   K 3.8 02/13/2020   CO2 25 02/13/2020   GLUCOSE 177 (H) 02/13/2020   BUN 23 (H) 02/13/2020   CREATININE 1.34 (H) 02/13/2020   BILITOT 0.3 09/06/2019   ALKPHOS 71 09/06/2019   AST 14 09/06/2019   ALT 35 12/07/2018   PROT 6.4 09/06/2019   ALBUMIN 4.0 09/06/2019   CALCIUM 8.3 (L) 02/13/2020   ANIONGAP 7 02/13/2020   GFR 102.93 07/23/2015   Lab Results  Component Value Date   CHOL 161 12/04/2018   Lab Results  Component Value Date   HDL 43 12/04/2018   Lab Results  Component Value Date   LDLCALC 107 (H) 12/04/2018   Lab  Results  Component Value Date   TRIG 53 12/04/2018   Lab Results  Component Value Date   CHOLHDL 3.7 12/04/2018   Lab Results  Component Value Date   HGBA1C 5.1 12/04/2018      Assessment & Plan:   Problem List Items Addressed This Visit      Cardiovascular and Mediastinum   Atrial fibrillation with RVR ([place]) - Primary Encourage pt to continue with current regimen and follow up with cardiology     Other Visit Diagnoses    Long term (current) use of anticoagulants     Denies any signs of bleeding . Encourage patient if any changes to notify either office.    Healthcare maintenance       Encounter for screening colonoscopy     Brief explanation of the procedure and what to expect Screening pending   Relevant Orders   Ambulatory referral to Gastroenterology      No orders of the defined types were placed in this encounter.   Follow-up: Return in about 6 months (around 09/06/2020).    [name], NP

## 2020-04-13 ENCOUNTER — Other Ambulatory Visit: Payer: Self-pay | Admitting: Interventional Cardiology

## 2020-04-13 ENCOUNTER — Other Ambulatory Visit: Payer: Self-pay | Admitting: Physician Assistant

## 2020-04-19 ENCOUNTER — Encounter: Payer: Self-pay | Admitting: Internal Medicine

## 2020-04-29 ENCOUNTER — Telehealth: Payer: Self-pay | Admitting: *Deleted

## 2020-04-29 ENCOUNTER — Ambulatory Visit: Payer: Self-pay | Admitting: Surgery

## 2020-04-29 NOTE — H&P (Signed)
History of Present Illness Terry Golden. Terry Wirick MD; 04/29/2020 9:34 AM) The patient is a 54 year old male who presents with a complaint of Mass. Referred by Dr. Coletta Memos for right shoulder mass Cards - [place]  This is a 54 year old male with congenital heart disease with history of mitral valve repair in 2000 as well as repair of atrioventricular septal defect presents with an enlarging mass on his right shoulder. Likely has been present for some time but it recently became larger and more uncomfortable. He gets twinges of pain in the shoulder when he moves. The patient is very lean and this mass is very noticeable. He presents now to discuss excision The patient was recently hospitalized for congestive heart failure. He is anticoagulated on Eliquis.  He was seen in June of 2020, but decided not to schedule surgery for various reasons. He comes in today to schedule surgery. Minimal enlargement of the lipoma    Allergies Terry Lorenzo, LPN; 11/14/2438 1:02 AM) Tensilon *ANTIMYASTHENIC/CHOLINERGIC AGENTS* Allergies Reconciled  Medication History Terry Lorenzo, LPN; 02/25/3663 4:03 AM) dilTIAZem HCl ER Coated Beads (240MG  Capsule ER 24HR, Oral) Active. Eliquis (5MG  Tablet, Oral) Active. Lisinopril (5MG  Tablet, Oral) Active. Medications Reconciled    Vitals Terry Billings Dockery LPN; 4/74/2595 6:38 AM) 04/29/2020 8:39 AM Weight: 152.2 lb Height: 69in Body Surface Area: 1.84 m Body Mass Index: 22.48 kg/m  Pulse: 71 (Regular)  BP: 126/82(Sitting, Left Arm, Standard)        Physical Exam Terry Key K. Dayvon Dax MD; 04/29/2020 9:35 AM)  The physical exam findings are as follows: Note:WDWN in NAD Eyes: Pupils equal, round; sclera anicteric HENT: Oral mucosa moist; good dentition Neck: No masses palpated, no thyromegaly Lungs: CTA bilaterally; normal respiratory effort CV: Regular rate and rhythm; no murmurs; extremities well-perfused with no edema Abd: +bowel  sounds, soft, non-tender, no palpable organomegaly; no palpable hernias Posterior right shoulder shows a 13 cm diameter subcutaneous mass. The edges are very well demarcated. The mass is mobile and does not seem fixed to the underlying fascia. No overlying skin changes. Skin: Warm, dry; no sign of jaundice Psychiatric - alert and oriented x 4; calm mood and affect    Assessment & Plan Terry Key K. 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 The surgical procedure has been discussed with the patient. Potential risks, benefits, alternative treatments, and expected outcomes have been explained. All of the patient's questions at this time have been answered. The likelihood of reaching the patient's treatment goal is good. The patient understand the proposed surgical procedure and wishes to proceed.   Cardiac clearance - hold Eliquis two days prior to surgery.  Terry Golden. Georgette Dover, MD, [facility] Surgery  General/ Trauma Surgery   04/29/2020 9:36 AM

## 2020-04-29 NOTE — Telephone Encounter (Signed)
Clinical pharmacist to review Eliquis 

## 2020-04-29 NOTE — Telephone Encounter (Signed)
   [facility] Pre-operative Risk Assessment    HEARTCARE STAFF: - Please ensure there is not already an duplicate clearance open for this procedure. - Under Visit Info/Reason for Call, type in Other and utilize the format Clearance MM/DD/YY or Clearance TBD. Do not use dashes or single digits. - If request is for dental extraction, please clarify the # of teeth to be extracted.  Request for surgical clearance:  1. What type of surgery is being performed? EXCISION OF LIPOMA   2. When is this surgery scheduled? TBD   3. What type of clearance is required (medical clearance vs. Pharmacy clearance to hold med vs. Both)? BOTH  4. Are there any medications that need to be held prior to surgery and how long? ELIQUIS   5. Practice name and name of physician performing surgery? [facility]; DR. Georgette Dover   6. What is the office phone number? [phone]   7.   What is the office fax number? [place]: Mammie Lorenzo, LPN  8.   Anesthesia type (None, local, MAC, general) ? GENERAL   Julaine Hua 04/29/2020, 10:57 AM  _________________________________________________________________   (provider comments below)

## 2020-04-30 NOTE — Telephone Encounter (Signed)
Patient with diagnosis of afultter on Eliquis for anticoagulation.    Procedure: EXCISION OF LIPOMA  Date of procedure: TBD  CHADS2-VASc score of  3 (CHF, HTN, CAD)  CrCl 60 ml/min  Per office protocol, patient can hold Eliquis for 2 days prior to procedure.

## 2020-04-30 NOTE — Telephone Encounter (Signed)
Left voice mail to call back to go over cardiac symptoms.  

## 2020-05-01 NOTE — Telephone Encounter (Signed)
Notes in regards to clearance for Lipoma were received in our office again today. I will send an FYI to the requesting office that pre op provider has left message to s/w the pt before clearing the pt.

## 2020-05-02 NOTE — Telephone Encounter (Signed)
   Primary Cardiologist: [name], MD  Chart reviewed as part of pre-operative protocol coverage. Patient was contacted 05/02/2020 in reference to pre-operative risk assessment for pending surgery as outlined below.  Terry Golden. was last seen on 01/22/20 by Dr. [name] with history of atrial flutter, congenital heart disease withpossible ostium primumASD, small residual VSD and severe MR with acleft mitral valve,status post atrioventricular septal defect repair and mitral valve repair at 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 Last echo 04/2019 showed EF 55-60%, pseudonormalization, mild LAE, mild-moderate MV regurgitation, no residual shunting seen.  I spoke with patient today who affirms he's been feeling well without any new cardiac symptoms. No CP, palpitations, SOB, syncope. Suspect he can be cleared but per pre-op protocol, any patients with history of congenital heart disease go to primary cardiologist for final input on clearance so will route to Dr. [name], then plan to bundle clearance to surgeon once we hear back. Dr. [name] - Please route response to [facility] PREOP (the pre-op pool). Thank you.   Terry Pitter, PA-C 05/02/2020, 9:05 AM

## 2020-05-03 NOTE — Telephone Encounter (Signed)
   Primary Cardiologist: [name], MD  Chart reviewed as part of pre-operative protocol coverage. Given past medical history and time since last visit, based on ACC/AHA guidelines, Gordon Vandunk. would be at acceptable risk for the planned procedure without further cardiovascular testing.   Patient with diagnosis of afultter on Eliquis for anticoagulation.    Procedure: EXCISION OF LIPOMA Date of procedure: TBD  CHADS2-VASc score of  3 (CHF, HTN, CAD)  CrCl 60 ml/min  Per office protocol, patient can hold Eliquis for 2 days prior to procedure.  I will route this recommendation to the requesting party via Epic fax function and remove from pre-op pool.  Please call with questions.  Jossie Ng. Zeba Luby NP-C    05/03/2020, 9:04 AM [place] [address] Office [phone] Fax [phone]

## 2020-05-03 NOTE — Telephone Encounter (Signed)
NO CAD by most recent cath.  No further cardiac testing needed before surgery.  JV

## 2020-06-15 ENCOUNTER — Other Ambulatory Visit (HOSPITAL_COMMUNITY)
Admission: RE | Admit: 2020-06-15 | Discharge: 2020-06-15 | Disposition: A | Discharge status: Home / Self Care | Payer: HRSA Program | Source: Ambulatory Visit | Attending: Surgery | Admitting: Surgery

## 2020-06-15 DIAGNOSIS — Z01812 Encounter for preprocedural laboratory examination: Secondary | ICD-10-CM | POA: Diagnosis present

## 2020-06-15 DIAGNOSIS — Z20822 Contact with and (suspected) exposure to covid-19: Secondary | ICD-10-CM | POA: Insufficient documentation

## 2020-06-15 LAB — SARS CORONAVIRUS 2 (TAT 6-24 HRS): SARS Coronavirus 2: NEGATIVE

## 2020-06-18 ENCOUNTER — Encounter (HOSPITAL_COMMUNITY): Payer: Self-pay | Admitting: Surgery

## 2020-06-18 ENCOUNTER — Other Ambulatory Visit: Payer: Self-pay

## 2020-06-18 NOTE — Progress Notes (Signed)
Mr.denies chest pain or shortness of breath. Patient was tested negative for Covid on 06/15/20 and has been in quarantine since that time.

## 2020-06-18 NOTE — Progress Notes (Signed)
Anesthesia Chart Review: Same day workup  Follows with cardiology for history of A. 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  Cardiac cath in 2017 demonstrated nonobstructive coronary artery disease.  Echo 04/2019 showed normal LV function, mild to moderate MR.  Last seen by Dr. [name] 01/22/2020, no changes to management.  Advised to follow-up in 1 year.    Preop clearance per telephone encounter 05/02/2020, "Chart reviewed as part of pre-operative protocol coverage. Patient was contacted 05/02/2020 in reference to pre-operative risk assessment for pending surgery as outlined below.  Terry Golden. was last seen on 01/22/20 by Dr. 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 Last echo 04/2019 showed EF 55-60%, pseudonormalization, mild LAE, mild-moderate MV regurgitation, no residual shunting seen.  I spoke with patient today who affirms he's been feeling well without any new cardiac symptoms. No CP, palpitations, SOB, syncope. Suspect he can be cleared but per pre-op protocol, any patients with history of congenital heart disease go to primary cardiologist for final input on clearance so will route to Dr. [name], then plan to bundle clearance to surgeon once we hear back. Dr. [name] - Please route response to P CV DIV PREOP (the pre-op pool). Thank you."  Dr. [name] subsequently commented per telephone encounter 05/03/2020, "NO CAD by most recent cath. No further cardiac testing needed before  surgery."  Patient was also cleared to hold Eliquis 2 days before procedure.  We will need day of surgery labs and evaluation.  EKG 02/12/2020: Sinus rhythm.  Rate 83. Biatrial enlargement. Left anterior fascicular block. Left ventricular hypertrophy  TTE 04/17/2019: 1. The left ventricle has normal systolic function, with an ejection  fraction of 55-60%. The cavity size was normal. There is moderate  concentric left ventricular hypertrophy. Left ventricular diastolic  Doppler parameters are consistent with  pseudonormalization. Elevated left atrial and left ventricular  end-diastolic pressures The E/e' is 24.  2. The right ventricle has normal systolic function. The cavity was  normal. There is no increase in right ventricular wall thickness. Right  ventricular systolic pressure is normal with an estimated pressure of 20.4  mmHg.  3. Left atrial size was mildly dilated.  4. A commisuroplasty valve is present in the mitral position. Procedure  Date: 03/25/1999.  5. Mitral valve regurgitation is mild to moderate by color flow Doppler.  Mild mitral valve stenosis.  6. The aortic valve is tricuspid. No stenosis of the aortic valve.  7. The aorta is normal unless otherwise noted.  8. The aortic root, ascending aorta and aortic arch are normal in size  and structure.  9. History of Congenital Heart disease History of AV canal defect with  reported ostium primum ASD, VSD, cleft mitral valve s/p repair of ASD, VSD  and repair of cleft mitral valve with commisuroplasty. No residual  shunting seen.   Event monitor 11/26/2017:  Normal sinus rhythm with rare PACs and PVCs.  Short runs of atrial tachycardia, longest was 11 beats.  No sustained arrthyhmias.   Cath 09/20/2015:  Mid LAD lesion, 20% stenosed. Minimal, nonobstructive CAD.  The left  ventricular systolic function is normal. No interventricular shunt noted.  Normal right heart pressures. PA sat 66%. CO 4.1 L/min. CI  2.2.   Continue medical therapy.  Serial echocardiograms to follow mitral valve disease.  Risk factor modification including avoiding tobacco.    Terry Golden [facility] Short Stay Center/Anesthesiology Phone [phone] 06/18/2020 3:49 PM
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
## 2020-06-19 ENCOUNTER — Ambulatory Visit (HOSPITAL_COMMUNITY): Payer: Self-pay | Admitting: Physician Assistant

## 2020-06-19 ENCOUNTER — Encounter (HOSPITAL_COMMUNITY)
Admission: RE | Disposition: A | Discharge status: Home / Self Care | Payer: Self-pay | Source: Home / Self Care | Attending: Surgery

## 2020-06-19 ENCOUNTER — Ambulatory Visit (HOSPITAL_COMMUNITY)
Admission: RE | Admit: 2020-06-19 | Discharge: 2020-06-19 | Disposition: A | Discharge status: Home / Self Care | Payer: Self-pay | Attending: Surgery | Admitting: Surgery

## 2020-06-19 ENCOUNTER — Encounter (HOSPITAL_COMMUNITY): Payer: Self-pay | Admitting: Surgery

## 2020-06-19 DIAGNOSIS — D1721 Benign lipomatous neoplasm of skin and subcutaneous tissue of right arm: Secondary | ICD-10-CM | POA: Insufficient documentation

## 2020-06-19 DIAGNOSIS — Q249 Congenital malformation of heart, unspecified: Secondary | ICD-10-CM | POA: Insufficient documentation

## 2020-06-19 DIAGNOSIS — Z7901 Long term (current) use of anticoagulants: Secondary | ICD-10-CM | POA: Insufficient documentation

## 2020-06-19 DIAGNOSIS — Z888 Allergy status to other drugs, medicaments and biological substances status: Secondary | ICD-10-CM | POA: Insufficient documentation

## 2020-06-19 DIAGNOSIS — Z79899 Other long term (current) drug therapy: Secondary | ICD-10-CM | POA: Insufficient documentation

## 2020-06-19 HISTORY — DX: Essential (primary) hypertension: I10

## 2020-06-19 HISTORY — PX: LIPOMA EXCISION: SHX5283

## 2020-06-19 HISTORY — DX: Cardiac arrhythmia, unspecified: I49.9

## 2020-06-19 LAB — CBC
HCT: 40.7 % (ref 39.0–52.0)
Hemoglobin: 13.7 g/dL (ref 13.0–17.0)
MCH: 33.3 pg (ref 26.0–34.0)
MCHC: 33.7 g/dL (ref 30.0–36.0)
MCV: 98.8 fL (ref 80.0–100.0)
Platelets: 235 10*3/uL (ref 150–400)
RBC: 4.12 MIL/uL — ABNORMAL LOW (ref 4.22–5.81)
RDW: 12.2 % (ref 11.5–15.5)
WBC: 6.4 10*3/uL (ref 4.0–10.5)
nRBC: 0 % (ref 0.0–0.2)

## 2020-06-19 LAB — BASIC METABOLIC PANEL WITH GFR
Anion gap: 8 (ref 5–15)
BUN: 12 mg/dL (ref 6–20)
CO2: 23 mmol/L (ref 22–32)
Calcium: 9.2 mg/dL (ref 8.9–10.3)
Chloride: 107 mmol/L (ref 98–111)
Creatinine, Ser: 1.16 mg/dL (ref 0.61–1.24)
GFR, Estimated: >60 mL/min
Glucose, Bld: 86 mg/dL (ref 70–99)
Potassium: 3.6 mmol/L (ref 3.5–5.1)
Sodium: 138 mmol/L (ref 135–145)

## 2020-06-19 SURGERY — EXCISION LIPOMA
Anesthesia: General | Site: Shoulder | Laterality: Right

## 2020-06-19 MED ORDER — ONDANSETRON HCL 4 MG/2ML IJ SOLN
INTRAMUSCULAR | Status: DC | PRN
  Administered 2020-06-19: 4 mg via INTRAVENOUS

## 2020-06-19 MED ORDER — LIDOCAINE 2% (20 MG/ML) 5 ML SYRINGE
INTRAMUSCULAR | Status: DC | PRN
  Administered 2020-06-19: 40 mg via INTRAVENOUS

## 2020-06-19 MED ORDER — EPHEDRINE SULFATE 50 MG/ML IJ SOLN
INTRAMUSCULAR | Status: DC | PRN
  Administered 2020-06-19 (×3): 5 mg via INTRAVENOUS
  Administered 2020-06-19: 10 mg via INTRAVENOUS

## 2020-06-19 MED ORDER — PROPOFOL 10 MG/ML IV BOLUS
INTRAVENOUS | Status: AC
  Filled 2020-06-19: qty 40

## 2020-06-19 MED ORDER — DEXAMETHASONE SODIUM PHOSPHATE 10 MG/ML IJ SOLN
INTRAMUSCULAR | Status: DC | PRN
  Administered 2020-06-19: 10 mg via INTRAVENOUS

## 2020-06-19 MED ORDER — BUPIVACAINE-EPINEPHRINE 0.25% -1:200000 IJ SOLN
INTRAMUSCULAR | Status: DC | PRN
  Administered 2020-06-19: 10 mL

## 2020-06-19 MED ORDER — 0.9 % SODIUM CHLORIDE (POUR BTL) OPTIME
TOPICAL | Status: DC | PRN
  Administered 2020-06-19: 1000 mL

## 2020-06-19 MED ORDER — CHLORHEXIDINE GLUCONATE CLOTH 2 % EX PADS: 6.0000 | MEDICATED_PAD | Freq: Once | CUTANEOUS | Status: DC

## 2020-06-19 MED ORDER — FENTANYL CITRATE (PF) 250 MCG/5ML IJ SOLN
INTRAMUSCULAR | Status: DC | PRN
  Administered 2020-06-19 (×3): 50 ug via INTRAVENOUS

## 2020-06-19 MED ORDER — BUPIVACAINE-EPINEPHRINE (PF) 0.25% -1:200000 IJ SOLN
INTRAMUSCULAR | Status: AC
  Filled 2020-06-19: qty 30

## 2020-06-19 MED ORDER — PROPOFOL 10 MG/ML IV BOLUS
INTRAVENOUS | Status: DC | PRN
  Administered 2020-06-19: 200 mg via INTRAVENOUS

## 2020-06-19 MED ORDER — ORAL CARE MOUTH RINSE: 15.0000 mL | Freq: Once | OROMUCOSAL | Status: AC

## 2020-06-19 MED ORDER — DEXAMETHASONE SODIUM PHOSPHATE 10 MG/ML IJ SOLN
INTRAMUSCULAR | Status: AC
  Filled 2020-06-19: qty 1

## 2020-06-19 MED ORDER — ACETAMINOPHEN 500 MG PO TABS
1000.0000 mg | ORAL_TABLET | Freq: Once | ORAL | Status: AC
  Administered 2020-06-19: 1000 mg via ORAL
  Filled 2020-06-19: qty 2

## 2020-06-19 MED ORDER — LACTATED RINGERS IV SOLN: INTRAVENOUS | Status: DC

## 2020-06-19 MED ORDER — ONDANSETRON HCL 4 MG/2ML IJ SOLN
INTRAMUSCULAR | Status: AC
  Filled 2020-06-19: qty 2

## 2020-06-19 MED ORDER — FENTANYL CITRATE (PF) 250 MCG/5ML IJ SOLN
INTRAMUSCULAR | Status: AC
  Filled 2020-06-19: qty 5

## 2020-06-19 MED ORDER — MIDAZOLAM HCL 2 MG/2ML IJ SOLN
INTRAMUSCULAR | Status: AC
  Filled 2020-06-19: qty 2

## 2020-06-19 MED ORDER — OXYCODONE HCL 5 MG PO TABS: 5.0000 mg | ORAL_TABLET | Freq: Four times a day (QID) | ORAL | 0 refills | Status: DC | PRN

## 2020-06-19 MED ORDER — PHENYLEPHRINE HCL (PRESSORS) 10 MG/ML IV SOLN
INTRAVENOUS | Status: DC | PRN
  Administered 2020-06-19: 40 ug via INTRAVENOUS

## 2020-06-19 MED ORDER — CEFAZOLIN SODIUM-DEXTROSE 2-4 GM/100ML-% IV SOLN
2.0000 g | INTRAVENOUS | Status: AC
  Administered 2020-06-19: 2 g via INTRAVENOUS
  Filled 2020-06-19: qty 100

## 2020-06-19 MED ORDER — MIDAZOLAM HCL 2 MG/2ML IJ SOLN
INTRAMUSCULAR | Status: DC | PRN
  Administered 2020-06-19: 2 mg via INTRAVENOUS

## 2020-06-19 MED ORDER — CHLORHEXIDINE GLUCONATE 0.12 % MT SOLN
15.0000 mL | Freq: Once | OROMUCOSAL | Status: AC
  Administered 2020-06-19: 15 mL via OROMUCOSAL
  Filled 2020-06-19: qty 15

## 2020-06-19 MED ORDER — ROCURONIUM BROMIDE 10 MG/ML (PF) SYRINGE
PREFILLED_SYRINGE | INTRAVENOUS | Status: DC | PRN
  Administered 2020-06-19: 50 mg via INTRAVENOUS

## 2020-06-19 MED ORDER — SUGAMMADEX SODIUM 200 MG/2ML IV SOLN
INTRAVENOUS | Status: DC | PRN
  Administered 2020-06-19: 200 mg via INTRAVENOUS

## 2020-06-19 SURGICAL SUPPLY — 40 items
APL PRP STRL LF DISP 70% ISPRP (MISCELLANEOUS) ×1
APL SKNCLS STERI-STRIP NONHPOA (GAUZE/BANDAGES/DRESSINGS) ×1
BENZOIN TINCTURE PRP APPL 2/3 (GAUZE/BANDAGES/DRESSINGS) ×3 IMPLANT
BLADE CLIPPER SURG (BLADE) IMPLANT
CHLORAPREP W/TINT 26 (MISCELLANEOUS) ×3 IMPLANT
CLOSURE STERI-STRIP 1/2X4 (GAUZE/BANDAGES/DRESSINGS) ×1
CLOSURE WOUND 1/2 X4 (GAUZE/BANDAGES/DRESSINGS) ×1
CLSR STERI-STRIP ANTIMIC 1/2X4 (GAUZE/BANDAGES/DRESSINGS) ×1 IMPLANT
COVER SURGICAL LIGHT HANDLE (MISCELLANEOUS) ×3 IMPLANT
COVER WAND RF STERILE (DRAPES) ×3 IMPLANT
DRAPE LAPAROTOMY 100X72 PEDS (DRAPES) ×3 IMPLANT
DRSG TEGADERM 4X4.75 (GAUZE/BANDAGES/DRESSINGS) ×2 IMPLANT
ELECT CAUTERY BLADE 6.4 (BLADE) IMPLANT
ELECT REM PT RETURN 9FT ADLT (ELECTROSURGICAL) ×3
ELECTRODE REM PT RTRN 9FT ADLT (ELECTROSURGICAL) ×1 IMPLANT
GAUZE 4X4 16PLY RFD (DISPOSABLE) ×2 IMPLANT
GAUZE SPONGE 4X4 12PLY STRL (GAUZE/BANDAGES/DRESSINGS) ×3 IMPLANT
GLOVE BIO SURGEON STRL SZ7 (GLOVE) ×3 IMPLANT
GLOVE BIOGEL PI IND STRL 7.5 (GLOVE) ×1 IMPLANT
GLOVE BIOGEL PI INDICATOR 7.5 (GLOVE) ×2
GOWN STRL REUS W/ TWL LRG LVL3 (GOWN DISPOSABLE) ×2 IMPLANT
GOWN STRL REUS W/TWL LRG LVL3 (GOWN DISPOSABLE) ×6
KIT BASIN OR (CUSTOM PROCEDURE TRAY) ×3 IMPLANT
KIT TURNOVER KIT B (KITS) ×3 IMPLANT
NDL HYPO 25GX1X1/2 BEV (NEEDLE) ×1 IMPLANT
NEEDLE HYPO 25GX1X1/2 BEV (NEEDLE) ×3 IMPLANT
NS IRRIG 1000ML POUR BTL (IV SOLUTION) ×3 IMPLANT
PACK GENERAL/GYN (CUSTOM PROCEDURE TRAY) ×3 IMPLANT
PAD ARMBOARD 7.5X6 YLW CONV (MISCELLANEOUS) ×3 IMPLANT
PENCIL SMOKE EVACUATOR (MISCELLANEOUS) ×3 IMPLANT
SPECIMEN JAR SMALL (MISCELLANEOUS) ×3 IMPLANT
STRIP CLOSURE SKIN 1/2X4 (GAUZE/BANDAGES/DRESSINGS) ×2 IMPLANT
SUT MNCRL AB 4-0 PS2 18 (SUTURE) ×3 IMPLANT
SUT VIC AB 2-0 SH 27 (SUTURE)
SUT VIC AB 2-0 SH 27X BRD (SUTURE) IMPLANT
SUT VIC AB 3-0 SH 27 (SUTURE) ×3
SUT VIC AB 3-0 SH 27XBRD (SUTURE) ×1 IMPLANT
SYR CONTROL 10ML LL (SYRINGE) ×3 IMPLANT
TOWEL GREEN STERILE (TOWEL DISPOSABLE) ×3 IMPLANT
TOWEL GREEN STERILE FF (TOWEL DISPOSABLE) ×3 IMPLANT

## 2020-06-19 NOTE — Discharge Instructions (Signed)
[facility] °Office Phone Number [phone] ° °Lipoma Excision: POST OP INSTRUCTIONS ° °Always review your discharge instruction sheet given to you by the facility where your surgery was performed. ° °IF YOU HAVE DISABILITY OR FAMILY LEAVE FORMS, YOU MUST BRING THEM TO THE OFFICE FOR PROCESSING.  DO NOT GIVE THEM TO YOUR DOCTOR. ° °1. A prescription for pain medication may be given to you upon discharge.  Take your pain medication as prescribed, if needed.  If narcotic pain medicine is not needed, then you may take acetaminophen (Tylenol) or ibuprofen (Advil) as needed. °2. Take your usually prescribed medications unless otherwise directed °3. If you need a refill on your pain medication, please contact your pharmacy.  They will contact our office to request authorization.  Prescriptions will not be filled after 5pm or on week-ends. °4. You should eat very light the first 24 hours after surgery, such as soup, crackers, pudding, etc.  Resume your normal diet the day after surgery. °5. Most patients will experience some swelling and bruising around the surgical site.  Ice packs will help.  Swelling and bruising can take several days to resolve.  °6. It is common to experience some constipation if taking pain medication after surgery.  Increasing fluid intake and taking a stool softener will usually help or prevent this problem from occurring.  A mild laxative (Milk of Magnesia or Miralax) should be taken according to package directions if there are no bowel movements after 48 hours. °7. You may remove your bandages 48 hours after surgery, and you may shower at that time.  You will have steri-strips (small skin tapes) in place directly over the incision.  These strips should be left on the skin for 7-10 days.   °8. ACTIVITIES:  You may resume regular daily activities (gradually increasing) beginning the next day.   You may have sexual intercourse when it is comfortable. °a. You may drive when you no  longer are taking prescription pain medication, you can comfortably wear a seatbelt, and you can safely maneuver your car and apply brakes. °b. RETURN TO WORK:  1-2 weeks °9. You should see your doctor in the office for a follow-up appointment approximately two to three weeks after your surgery.   ° °WHEN TO CALL YOUR DOCTOR: °1. Fever over 101.0 °2. Nausea and/or vomiting. °3. Extreme swelling or bruising. °4. Continued bleeding from incision. °5. Increased pain, redness, or drainage from the incision. ° °The clinic staff is available to answer your questions during regular business hours.  Please don’t hesitate to call and ask to speak to one of the nurses for clinical concerns.  If you have a medical emergency, go to the nearest emergency room or call 911.  A surgeon from [facility] is always on call at the hospital. ° °For further questions, please visit [email or web address]  ° ° °

## 2020-06-19 NOTE — Anesthesia Postprocedure Evaluation (Signed)
Anesthesia Post Note  Patient: Terry Golden.  Procedure(s) Performed: EXCISION SUBCUTANEOUS LIPOMA RIGHT SHOULDER (Right Shoulder)     Patient location during evaluation: PACU Anesthesia Type: General Level of consciousness: awake and alert, oriented and patient cooperative Pain management: pain level controlled Vital Signs Assessment: post-procedure vital signs reviewed and stable Respiratory status: spontaneous breathing, nonlabored ventilation and respiratory function stable Cardiovascular status: blood pressure returned to baseline and stable Postop Assessment: no apparent nausea or vomiting Anesthetic complications: no   No complications documented.  Last Vitals:  Vitals:   06/19/20 1311 06/19/20 1316  BP: (!) 148/73 139/76  Pulse: (!) 56 (!) 57  Resp: 18 19  Temp:  36.5 C  SpO2: 99% 98%    Last Pain:  Vitals:   06/19/20 1316  TempSrc:   PainSc: 0-No pain                 Pervis Hocking

## 2020-06-19 NOTE — Transfer of Care (Signed)
Immediate Anesthesia Transfer of Care Note  Patient: Terry Golden.  Procedure(s) Performed: EXCISION SUBCUTANEOUS LIPOMA RIGHT SHOULDER (Right Shoulder)  Patient Location: PACU  Anesthesia Type:General  Level of Consciousness: awake, patient cooperative and responds to stimulation  Airway & Oxygen Therapy: Patient Spontanous Breathing  Post-op Assessment: Report given to RN and Post -op Vital signs reviewed and stable  Post vital signs: Reviewed and stable  Last Vitals:  Vitals Value Taken Time  BP 143/71 06/19/20 1241  Temp    Pulse 69 06/19/20 1245  Resp 21 06/19/20 1245  SpO2 99 % 06/19/20 1245  Vitals shown include unvalidated device data.  Last Pain:  Vitals:   06/19/20 0949  TempSrc:   PainSc: 0-No pain         Complications: No complications documented.

## 2020-06-19 NOTE — Anesthesia Procedure Notes (Signed)
Performed by: Jalin Alicea M, CRNA       

## 2020-06-19 NOTE — Anesthesia Procedure Notes (Signed)
Procedure Name: Intubation Date/Time: 06/19/2020 11:41 AM Performed by: Verdie Drown, CRNA Pre-anesthesia Checklist: Patient identified, Emergency Drugs available, Suction available and Patient being monitored Patient Re-evaluated:Patient Re-evaluated prior to induction Oxygen Delivery Method: Circle System Utilized Preoxygenation: Pre-oxygenation with 100% oxygen Induction Type: IV induction Ventilation: Mask ventilation without difficulty Laryngoscope Size: Mac and 4 Grade View: Grade II Tube type: Oral Tube size: 7.5 mm Number of attempts: 1 Airway Equipment and Method: Stylet and Oral airway Placement Confirmation: ETT inserted through vocal cords under direct vision,  positive ETCO2 and breath sounds checked- equal and bilateral Secured at: 23 cm Tube secured with: Tape Dental Injury: Teeth and Oropharynx as per pre-operative assessment  Difficulty Due To: Difficult Airway- due to dentition Comments: Poor dentition

## 2020-06-19 NOTE — Op Note (Signed)
Preop diagnosis: Subcutaneous mass posterior right shoulder (13 cm) Postop diagnosis: Same Procedure performed: Excision of subcutaneous mass posterior right shoulder Surgeon:Lira Stephen K Selig [place] Anesthesia: General Indications:  This is a 54 year old male with congenital heart disease with history of mitral valve repair in 2000 as well as repair of atrioventricular septal defect presents with an enlarging mass on his right shoulder. Likely has been present for some time but it recently became larger and more uncomfortable. He gets twinges of pain in the shoulder when he moves. The patient is very lean and this mass is very noticeable. He presents now to discuss excision The patient was recently hospitalized for congestive heart failure. He is anticoagulated on Eliquis.  Description of procedure: The patient is brought to the operating room placed in the supine position on the operating room table on a beanbag.  After an adequate level of general anesthesia was obtained, he was moved to a lateral position on his left side.  His posterior right shoulder was prepped with ChloraPrep and draped sterile fashion.  A timeout was taken to ensure the proper patient and proper procedure.  We infiltrated the area over the mass with 0.25% Marcaine with epinephrine.  I made a transverse incision over this area.  We dissected through the dermis.  As I was dissecting through the dermis I encountered some unusual stringy, viscous material.  We had not yet reached the surface of the mass.  I finally dissected down to the surface of the mass.  To direct palpation, it did not feel like a typical lipoma.  There was a "slimy" viscous sensation to the palpable mass.  There are multiple appendages protruding from the main portion of the mass that extended in all directions in the subcutaneous tissue.  We carefully excised all of these appendages along with the main mass.  This was dissected off of the underlying fascia.  The mass  was sent for pathologic examination.  I palpated the wound carefully and inspected to make sure there was no portion of the mass that was left behind.  We irrigated thoroughly and used cautery for hemostasis.  The wound was closed with a deep layer of 3-0 Vicryl tacking the dermis down to the underlying fascia.  4-0 Monocryl was used to close the skin.  Benzoin and Steri-Strips were applied.  The patient was then extubated and brought to the recovery room in stable condition.  All sponge, instrument, and needle counts are correct.  Imogene Burn. Georgette Dover, MD, [facility]  General/ Trauma Surgery   06/19/2020 12:37 PM

## 2020-06-19 NOTE — H&P (Signed)
  History of Present Illness  The patient is a 54 year old male who presents with a complaint of Mass. Referred by Dr. Coletta Memos for right shoulder mass Cards - [place]  This is a 53 year old male with congenital heart disease with history of mitral valve repair in 2000 as well as repair of atrioventricular septal defect presents with an enlarging mass on his right shoulder. Likely has been present for some time but it recently became larger and more uncomfortable. He gets twinges of pain in the shoulder when he moves. The patient is very lean and this mass is very noticeable. He presents now to discuss excision The patient was recently hospitalized for congestive heart failure. He is anticoagulated on Eliquis.  He was seen in June of 2020, but decided not to schedule surgery for various reasons. He comes in today to schedule surgery. Minimal enlargement of the lipoma    Allergies  Tensilon *ANTIMYASTHENIC/CHOLINERGIC AGENTS* Allergies Reconciled  Medication History  dilTIAZem HCl ER Coated Beads (240MG  Capsule ER 24HR, Oral) Active. Eliquis (5MG  Tablet, Oral) Active. Lisinopril (5MG  Tablet, Oral) Active. Medications Reconciled    Vitals  Weight: 152.2 lb Height: 69in Body Surface Area: 1.84 m Body Mass Index: 22.48 kg/m  Pulse: 71 (Regular)  BP: 126/82(Sitting, Left Arm, Standard)        Physical Exam   The physical exam findings are as follows: Note:WDWN in NAD Eyes: Pupils equal, round; sclera anicteric HENT: Oral mucosa moist; good dentition Neck: No masses palpated, no thyromegaly Lungs: CTA bilaterally; normal respiratory effort CV: Regular rate and rhythm; no murmurs; extremities well-perfused with no edema Abd: +bowel sounds, soft, non-tender, no palpable organomegaly; no palpable hernias Posterior right shoulder shows a 13 cm diameter subcutaneous mass. The edges are very well demarcated. The mass is mobile  and does not seem fixed to the underlying fascia. No overlying skin changes. 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 The surgical procedure has been discussed with the patient. Potential risks, benefits, alternative treatments, and expected outcomes have been explained. All of the patient's questions at this time have been answered. The likelihood of reaching the patient's treatment goal is good. The patient understand the proposed surgical procedure and wishes to proceed.  Imogene Burn. Georgette Dover, MD, [facility]  General/ Trauma Surgery   06/19/2020 10:02 AM

## 2020-06-20 ENCOUNTER — Encounter (HOSPITAL_COMMUNITY): Payer: Self-pay | Admitting: Surgery

## 2020-06-20 LAB — SURGICAL PATHOLOGY

## 2020-07-01 ENCOUNTER — Other Ambulatory Visit: Payer: Self-pay | Admitting: Nurse Practitioner

## 2020-07-01 ENCOUNTER — Encounter: Payer: Self-pay | Admitting: Internal Medicine

## 2020-07-01 ENCOUNTER — Telehealth: Payer: Self-pay

## 2020-07-01 ENCOUNTER — Telehealth: Payer: Self-pay | Admitting: Physician Assistant

## 2020-07-01 ENCOUNTER — Ambulatory Visit (INDEPENDENT_AMBULATORY_CARE_PROVIDER_SITE_OTHER): Payer: Self-pay | Admitting: Internal Medicine

## 2020-07-01 VITALS — BP 148/64 | HR 59 | Ht 69.0 in | Wt 150.0 lb

## 2020-07-01 DIAGNOSIS — Z7901 Long term (current) use of anticoagulants: Secondary | ICD-10-CM

## 2020-07-01 DIAGNOSIS — Z1211 Encounter for screening for malignant neoplasm of colon: Secondary | ICD-10-CM

## 2020-07-01 MED ORDER — APIXABAN 5 MG PO TABS: 5.0000 mg | ORAL_TABLET | Freq: Two times a day (BID) | ORAL | 1 refills | Status: DC

## 2020-07-01 NOTE — Telephone Encounter (Signed)
Please address eliquis thanks. For colonoscopy

## 2020-07-01 NOTE — Patient Instructions (Addendum)
You have been scheduled for a colonoscopy. Please follow written instructions given to you at your visit today.  Please pick up your prep supplies at the pharmacy within the next 1-3 days. If you use inhalers (even only as needed), please bring them with you on the day of your procedure.   

## 2020-07-01 NOTE — Telephone Encounter (Signed)
Patient with diagnosis of aflutter on Eliquis for anticoagulation.    Procedure: Colonoscopy Date of procedure: 07/08/20    CHA2DS2-VASc Score = 3  This indicates a 3.2% annual risk of stroke. The patient's score is based upon: CHF History: 1 HTN History: 1 Diabetes History: 0 Stroke History: 0 Vascular Disease History: 1 Age Score: 0 Gender Score: 0     CrCl 72 ml/min  Per office protocol, patient can hold Eliquis for 2 days prior to procedure.
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
## 2020-07-01 NOTE — Progress Notes (Signed)
Patient ID: Terry Santee., male   DOB: 02-Dec-1965, 54 y.o.   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  He reports that he is feeling well. He has never had a colonoscopy. He reports that he has regular bowel movements that occur usually daily. No visible blood in stool or melena. No abdominal pain. No upper GI or hepatobiliary complaint.  He does take Eliquis twice daily and he recently held this medication for a lipoma removal performed by Dr. Georgette Dover.  He does smoke tobacco but is down to 4 cigarettes/day down from roughly 40 cigarettes/day. His goal is to quit completely.  No known family history of colon cancer. No alcohol or illicit drug use.  Past Medical History:  Diagnosis Date  . ASD (atrial septal defect)    large   . Congenital heart disease    ASVD, Cleft Mitral Valve  . Dysrhythmia    aflutter  . Echocardiogram    Echo 01/2019: EF 45-50, mod basal septal hypertrophy, Gr 2 DD, s/p VSD closure with no residual shunt,s/p MV ring with mod MR, s/p ASD closure with no residual shunt  . Heart murmur    from Rheumatic Fever  . Hypertension   . 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 Mitral valve disease 03/25/1999   s/p mitral valve repair with commissuroplasty   . Rheumatic fever   . Syncope    1999  . Typical atrial flutter ([place])   . VSD (ventricular septal defect)    small    Past Surgical History:  Procedure Laterality Date  . CARDIAC CATHETERIZATION N/A 09/20/2015   Procedure: Right/Left Heart Cath and Coronary Angiography;  Surgeon: Jettie Booze, MD;  Location: [place] [facility];  Service: Cardiovascular;  Laterality: N/A;  . CARDIAC SURGERY     s/p partial AVSD repair and cleft  mitral valve repair with commissuroplasty  . CARDIAC VALVE REPLACEMENT    . CARDIOVERSION N/A 12/07/2018   Procedure: CARDIOVERSION;  Surgeon: Dorothy Spark, MD;  Location: [facility];  Service: Cardiovascular;  Laterality: N/A;  . LIPOMA EXCISION Right 06/19/2020   Procedure: EXCISION SUBCUTANEOUS LIPOMA RIGHT SHOULDER;  Surgeon: Donnie Mesa, MD;  Location: [place];  Service: General;  Laterality: Right;  . TEE WITHOUT CARDIOVERSION N/A 12/07/2018   Procedure: TRANSESOPHAGEAL ECHOCARDIOGRAM (TEE);  Surgeon: Dorothy Spark, MD;  Location: [facility] ENDOSCOPY;  Service: Cardiovascular;  Laterality: N/A;    Outpatient Medications Prior to Visit  Medication Sig Dispense Refill  . apixaban (ELIQUIS) 5 MG TABS tablet Take 1 tablet (5 mg total) by mouth 2 (two) times daily. 180 tablet 1  . aspirin-acetaminophen-caffeine (EXCEDRIN MIGRAINE) 250-250-65 MG tablet Take 1 tablet by mouth every 6 (six) hours as needed for headache.    . diltiazem (CARDIZEM CD) 300 MG 24 hr capsule TAKE 1 CAPSULE BY MOUTH EVERY DAY (Patient taking differently: Take 300 mg by mouth daily. ) 90 capsule 2  . lisinopril (ZESTRIL) 5 MG tablet TAKE 1 TABLET BY MOUTH EVERY DAY (Patient taking differently: Take 5 mg by mouth daily. ) 90 tablet 2  . oxyCODONE (OXY IR/ROXICODONE) 5 MG immediate release tablet Take 1 tablet (5 mg total) by mouth every 6 (six) hours as needed for  severe pain. 15 tablet 0   No facility-administered medications prior to visit.    Allergies  Allergen Reactions  . Tensilon [Edrophonium] Other (See Comments)    Blacked out     Family History  Problem Relation Age of Onset  . Diabetes Sister   . Mental illness Sister   . Heart disease Sister   . Arthritis Other   . Hypertension Other   . Alcohol abuse Other   . Heart attack Father   . Stroke Father   . Heart disease Other   . Arthritis Other   . Heart disease Other   . Stroke Other   . Mental illness Other   . Kidney disease Other   .  Breast cancer Other        aunts  . Colon cancer Neg Hx     Social History   Tobacco Use  . Smoking status: Current Every Day Smoker    Packs/day: 0.25    Years: 42.00    Pack years: 10.50    Types: Cigarettes  . Smokeless tobacco: Never Used  . Tobacco comment: 5 cigarettes per day  Vaping Use  . Vaping Use: Never used  Substance Use Topics  . Alcohol use: Never    Alcohol/week: 0.0 standard drinks  . Drug use: No    ROS: As per history of present illness, otherwise negative  Ht 5\' 9"  (1.753 m)   Wt 150 lb (68 kg)   BMI 22.15 kg/m  Gen: awake, alert, NAD HEENT: anicteric, op clear CV: RRR, 2/6 sem Pulm: CTA b/l Abd: soft, NT/ND, +BS throughout Ext: no c/c/e Neuro: nonfocal   RELEVANT LABS AND IMAGING: CBC    Component Value Date/Time   WBC 6.4 06/19/2020 0927   RBC 4.12 (L) 06/19/2020 0927   HGB 13.7 06/19/2020 0927   HGB 12.1 (L) 09/06/2019 1200   HCT 40.7 06/19/2020 0927   HCT 36.0 (L) 09/06/2019 1200   PLT 235 06/19/2020 0927   PLT 285 09/06/2019 1200   MCV 98.8 06/19/2020 0927   MCV 98 (H) 09/06/2019 1200   MCH 33.3 06/19/2020 0927   MCHC 33.7 06/19/2020 0927   RDW 12.2 06/19/2020 0927   RDW 12.5 09/06/2019 1200   LYMPHSABS 1.5 09/06/2019 1200   MONOABS 0.4 12/07/2018 0526   EOSABS 0.3 09/06/2019 1200   BASOSABS 0.0 09/06/2019 1200    CMP     Component Value Date/Time   NA 138 06/19/2020 0927   NA 142 09/06/2019 1200   K 3.6 06/19/2020 0927   CL 107 06/19/2020 0927   CO2 23 06/19/2020 0927   GLUCOSE 86 06/19/2020 0927   BUN 12 06/19/2020 0927   BUN 10 09/06/2019 1200   CREATININE 1.16 06/19/2020 0927   CREATININE 1.08 09/17/2015 0941   CALCIUM 9.2 06/19/2020 0927   PROT 6.4 09/06/2019 1200   ALBUMIN 4.0 09/06/2019 1200   AST 14 09/06/2019 1200   ALT 35 12/07/2018 0526   ALKPHOS 71 09/06/2019 1200   BILITOT 0.3 09/06/2019 1200   GFRNONAA >60 06/19/2020 0927   GFRAA >60 02/13/2020 0245    ASSESSMENT/PLAN: 54 year old with a  past medical history of atrial flutter, rheumatic fever, congenital heart disease with possible ostium primum ASD, status post atrioventricular septal defect repair and mitral valve repair at [facility] in 2000 on Eliquis who is seen to discuss colon cancer screening.  1. Colon cancer screening --average risk for colon cancer 54 year old male. No prior colonoscopy. I recommend that he  proceed with colonoscopy. I did discuss that we would need to interrupt anticoagulation therapy prior to colonoscopy and we will discuss this with Dr. [name]. We discussed the risk, benefits and alternatives to colonoscopy and he is agreeable and wishes to proceed  Will hold Eliquis 2 days prior to endoscopic procedures - will instruct when and how to resume after procedure. Benefits and risks of procedure explained including risks of bleeding, perforation, infection, missed lesions, reactions to medications and possible need for hospitalization and surgery for complications. Additional rare but real risk of stroke or other vascular clotting events off Eliquis also explained and need to seek urgent help if any signs of these problems occur. Will communicate by phone or EMR with patient's  prescribing provider to confirm that holding Eliquis is reasonable in this case.      OH:[facility], [address] [place],  [place] [address]

## 2020-07-01 NOTE — Telephone Encounter (Signed)
[facility] Pre-operative Risk Assessment     Request for surgical clearance:     Endoscopy Procedure  What type of surgery is being performed?     Colonoscopy  When is this surgery scheduled?     07/08/20  What type of clearance is required ?   Pharmacy  Are there any medications that need to be held prior to surgery and how long? Eliquis x 2 days  Practice name and name of physician performing surgery?      [facility]  What is your office phone and fax number?      Phone- [phone]  [phone]  Anesthesia type (None, local, MAC, general) ?       MAC

## 2020-07-02 NOTE — Telephone Encounter (Signed)
Informed patient he can hold Eliquis 2 days prior to his procedure. Patient verbalized understanding. °

## 2020-07-03 ENCOUNTER — Telehealth: Payer: Self-pay | Admitting: Interventional Cardiology

## 2020-07-03 NOTE — Telephone Encounter (Signed)
**Note De-Identified Terry Golden Obfuscation** I have advised the pt  that we have left him Eliquis samples in the front office at Dr Hassell Done office at [facility] for him to pick up and he states that he has already applied for asst through [facility] for his Eliquis and states that they advised him that his application is pending. I have no knowledge of him applying for [employer] lately and am unsure who handled his application but the pt insist that he has applied.

## 2020-07-03 NOTE — Telephone Encounter (Signed)
I will forward call to prgm asst nurse Jeani Hawking as well as FYI to primary card nurse [place] as Juluis Rainier.

## 2020-07-03 NOTE — Telephone Encounter (Signed)
3 boxes of Eliquis 5 mg samples have been left up front per Lynn's request.

## 2020-07-03 NOTE — Telephone Encounter (Signed)
   Pt c/o medication issue:  1. Name of Medication: apixaban (ELIQUIS) 5 MG TABS tablet  2. How are you currently taking this medication (dosage and times per day)? Take 1 tablet (5 mg total) by mouth 2 (two) times daily.  3. Are you having a reaction (difficulty breathing--STAT)?   4. What is your medication issue? Pt said he been un-enrolled for pt assistance since July 2021, he was not aware of it and been out of eliquis for 3 days now. He would like to re-enroll for the pt assistance and needs help to get a refill of his eliquis

## 2020-07-08 ENCOUNTER — Encounter: Payer: Self-pay | Admitting: Internal Medicine

## 2020-07-08 ENCOUNTER — Other Ambulatory Visit: Payer: Self-pay

## 2020-07-08 ENCOUNTER — Ambulatory Visit (AMBULATORY_SURGERY_CENTER): Payer: Self-pay | Admitting: Internal Medicine

## 2020-07-08 VITALS — BP 104/62 | HR 69 | Temp 98.1°F | Resp 17 | Ht 69.0 in | Wt 150.0 lb

## 2020-07-08 DIAGNOSIS — Z1211 Encounter for screening for malignant neoplasm of colon: Secondary | ICD-10-CM

## 2020-07-08 HISTORY — PX: COLONOSCOPY: SHX174

## 2020-07-08 MED ORDER — SODIUM CHLORIDE 0.9 % IV SOLN: 500.0000 mL | Freq: Once | INTRAVENOUS | Status: DC

## 2020-07-08 NOTE — Patient Instructions (Signed)
Discharge instructions given. °Handout on Hemorrhoids. °Resume previous medications. °YOU HAD AN ENDOSCOPIC PROCEDURE TODAY AT THE [facility]:   Refer to the procedure report that was given to you for any specific questions about what was found during the examination.  If the procedure report does not answer your questions, please call your gastroenterologist to clarify.  If you requested that your care partner not be given the details of your procedure findings, then the procedure report has been included in a sealed envelope for you to review at your convenience later. ° °YOU SHOULD EXPECT: Some feelings of bloating in the abdomen. Passage of more gas than usual.  Walking can help get rid of the air that was put into your GI tract during the procedure and reduce the bloating. If you had a lower endoscopy (such as a colonoscopy or flexible sigmoidoscopy) you may notice spotting of blood in your stool or on the toilet paper. If you underwent a bowel prep for your procedure, you may not have a normal bowel movement for a few days. ° °Please Note:  You might notice some irritation and congestion in your nose or some drainage.  This is from the oxygen used during your procedure.  There is no need for concern and it should clear up in a day or so. ° °SYMPTOMS TO REPORT IMMEDIATELY: ° °Following lower endoscopy (colonoscopy or flexible sigmoidoscopy): ° Excessive amounts of blood in the stool ° Significant tenderness or worsening of abdominal pains ° Swelling of the abdomen that is new, acute ° Fever of 100°F or higher ° ° °For urgent or emergent issues, a gastroenterologist can be reached at any hour by calling [phone]. °Do not use MyChart messaging for urgent concerns.  ° ° °DIET:  We do recommend a small meal at first, but then you may proceed to your regular diet.  Drink plenty of fluids but you should avoid alcoholic beverages for 24 hours. ° °ACTIVITY:  You should plan to take it easy for the  rest of today and you should NOT DRIVE or use heavy machinery until tomorrow (because of the sedation medicines used during the test).   ° °FOLLOW UP: °Our staff will call the number listed on your records 48-72 hours following your procedure to check on you and address any questions or concerns that you may have regarding the information given to you following your procedure. If we do not reach you, we will leave a message.  We will attempt to reach you two times.  During this call, we will ask if you have developed any symptoms of COVID 19. If you develop any symptoms (ie: fever, flu-like symptoms, shortness of breath, cough etc.) before then, please call [phone].  If you test positive for Covid 19 in the 2 weeks post procedure, please call and report this information to us.   ° °If any biopsies were taken you will be contacted by phone or by letter within the next 1-3 weeks.  Please call us at [phone] if you have not heard about the biopsies in 3 weeks.  ° ° °SIGNATURES/CONFIDENTIALITY: °You and/or your care partner have signed paperwork which will be entered into your electronic medical record.  These signatures attest to the fact that that the information above on your After Visit Summary has been reviewed and is understood.  Full responsibility of the confidentiality of this discharge information lies with you and/or your care-partner.  °

## 2020-07-08 NOTE — Op Note (Signed)
[place] Patient Name: Terry Golden Procedure Date: 07/08/2020 1:53 PM MRN: [id number] Endoscopist: Jerene Bears , MD Age: 54 Referring MD:  Date of Birth: 04-17-1966 Gender: Male Account #: [id number] Procedure:                Colonoscopy Indications:              Screening for colorectal malignant neoplasm, This                            is the patient's first colonoscopy Medicines:                Monitored Anesthesia Care Procedure:                Pre-Anesthesia Assessment:                           - Prior to the procedure, a History and Physical                            was performed, and patient medications and                            allergies were reviewed. The patient's tolerance of                            previous anesthesia was also reviewed. The risks                            and benefits of the procedure and the sedation                            options and risks were discussed with the patient.                            All questions were answered, and informed consent                            was obtained. Prior Anticoagulants: The patient has                            taken Eliquis (apixaban), last dose was 2 days                            prior to procedure. ASA Grade Assessment: III - A                            patient with severe systemic disease. After                            reviewing the risks and benefits, the patient was                            deemed in satisfactory condition to undergo the  procedure.                           After obtaining informed consent, the colonoscope                            was passed under direct vision. Throughout the                            procedure, the patient's blood pressure, pulse, and                            oxygen saturations were monitored continuously. The                            Colonoscope was introduced through the anus and                             advanced to the cecum, identified by appendiceal                            orifice and ileocecal valve. The colonoscopy was                            performed without difficulty. The patient tolerated                            the procedure well. The quality of the bowel                            preparation was good. The ileocecal valve,                            appendiceal orifice, and rectum were photographed. Scope In: 2:07:35 PM Scope Out: 2:25:16 PM Scope Withdrawal Time: 0 hours 9 minutes 5 seconds  Total Procedure Duration: 0 hours 17 minutes 41 seconds  Findings:                 The digital rectal exam was normal.                           The colon (entire examined portion) appeared normal.                           Internal hemorrhoids were found during                            retroflexion. The hemorrhoids were small. Complications:            No immediate complications. Estimated Blood Loss:     Estimated blood loss: none. Impression:               - The entire examined colon is normal.                           - Small internal hemorrhoids.                           -  No specimens collected. Recommendation:           - Patient has a contact number available for                            emergencies. The signs and symptoms of potential                            delayed complications were discussed with the                            patient. Return to normal activities tomorrow.                            Written discharge instructions were provided to the                            patient.                           - Resume previous diet.                           - Continue present medications.                           - Resume Eliquis (apixaban) at prior dose today.                            Refer to managing physician for further adjustment                            of therapy.                           - Repeat colonoscopy in 10 years for  screening                            purposes. Jerene Bears, MD 07/08/2020 2:28:02 PM This report has been signed electronically.

## 2020-07-08 NOTE — Progress Notes (Signed)
Pt's states no medical or surgical changes since previsit or office visit.  ° °Vitals CW °

## 2020-07-10 ENCOUNTER — Telehealth: Payer: Self-pay

## 2020-07-10 NOTE — Telephone Encounter (Signed)
  Follow up Call-  Call back number 07/08/2020  Post procedure Call Back phone  # [phone]  Permission to leave phone message Yes  Some recent data might be hidden     Patient questions:  Do you have a fever, pain , or abdominal swelling? No. Pain Score  0 *  Have you tolerated food without any problems? Yes.    Have you been able to return to your normal activities? Yes.    Do you have any questions about your discharge instructions: Diet   No. Medications  No. Follow up visit  No.  Do you have questions or concerns about your Care? No.  Actions: * If pain score is 4 or above: 1. No action needed, pain <4.Have you developed a fever since your procedure? no  2.   Have you had an respiratory symptoms (SOB or cough) since your procedure? no  3.   Have you tested positive for COVID 19 since your procedure no  4.   Have you had any family members/close contacts diagnosed with the COVID 19 since your procedure?  no   If yes to any of these questions please route to Joylene John, RN and Joella Prince, RN

## 2020-07-10 NOTE — Telephone Encounter (Signed)
No answer, left message to call back later today, B.Talea Manges RN. 

## 2020-07-15 ENCOUNTER — Ambulatory Visit (INDEPENDENT_AMBULATORY_CARE_PROVIDER_SITE_OTHER): Payer: Self-pay | Admitting: Interventional Cardiology

## 2020-07-15 ENCOUNTER — Telehealth: Payer: Self-pay | Admitting: Pharmacist

## 2020-07-15 ENCOUNTER — Other Ambulatory Visit: Payer: Self-pay

## 2020-07-15 ENCOUNTER — Encounter: Payer: Self-pay | Admitting: Interventional Cardiology

## 2020-07-15 VITALS — BP 138/78 | HR 66 | Ht 69.0 in | Wt 153.0 lb

## 2020-07-15 DIAGNOSIS — F17200 Nicotine dependence, unspecified, uncomplicated: Secondary | ICD-10-CM

## 2020-07-15 DIAGNOSIS — I059 Rheumatic mitral valve disease, unspecified: Secondary | ICD-10-CM

## 2020-07-15 DIAGNOSIS — I5022 Chronic systolic (congestive) heart failure: Secondary | ICD-10-CM

## 2020-07-15 DIAGNOSIS — D6869 Other thrombophilia: Secondary | ICD-10-CM

## 2020-07-15 DIAGNOSIS — I484 Atypical atrial flutter: Secondary | ICD-10-CM
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
## 2020-07-15 NOTE — Progress Notes (Signed)
Cardiology Office Note   Date:  07/15/2020   ID:  Terry Santee., DOB [birth year], MRN [id number]  PCP:  [name], NP    No chief complaint on file.  AFlutter  Wt Readings from Last 3 Encounters:  07/15/20 153 lb (69.4 kg)  07/08/20 150 lb (68 kg)  07/01/20 150 lb (68 kg)       History of Present Illness: Terry Kattner. is a 54 y.o. 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 Cardiac catheterization in 2017 demonstrated nonobstructive coronary artery diseasewho [facility]. Patient was admitted on 12/04/18 with atrial flutter with RVR and acute CHF. EF was 40% and was felt to be tachycardia mediated. Seen by Dr [name] 12/12/18 who recommend continuing diltiazem.  TEE/CV in 12/2018.  04/2019 echo showed improved LV function and mild to moderate MR; no residual intracardiac shunt visualized.  He had a colonoscopy in 2021.  No polyps.    Minimal walking for activity.  Denies : Chest pain. Dizziness. Leg edema. Nitroglycerin use. Orthopnea. Paroxysmal nocturnal dyspnea. Shortness of breath. Syncope.   He feels an occasional skipped.  No fast palpitations. Nothing prolonged. No bleeding problems.    He has been out of his Eliquis.  He had been getting shipments to his house.  He pretty much eats what he wants and does not gain weight.     Past Medical History:  Diagnosis Date  . ASD (atrial septal defect)    large   . Congenital heart disease    ASVD, Cleft Mitral Valve  . Dysrhythmia    aflutter  . Echocardiogram    Echo 01/2019: EF 45-50, mod basal septal hypertrophy, Gr 2 DD, s/p VSD closure with no residual shunt,s/p MV ring with mod MR, s/p ASD closure with no  residual shunt  . Heart murmur    from Rheumatic Fever  . Hypertension   . 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 Mitral valve disease 03/25/1999   s/p mitral valve repair with commissuroplasty   . Rheumatic fever   . Syncope    1999  . Typical atrial flutter ([place])   . VSD (ventricular septal defect)    small    Past Surgical History:  Procedure Laterality Date  . CARDIAC CATHETERIZATION N/A 09/20/2015   Procedure: Right/Left Heart Cath and Coronary Angiography;  Surgeon: Jettie Booze, MD;  Location: [facility] [facility];  Service: Cardiovascular;  Laterality: N/A;  . CARDIAC SURGERY     s/p partial AVSD repair and cleft mitral valve repair with commissuroplasty  . CARDIAC VALVE REPLACEMENT    . CARDIOVERSION N/A 12/07/2018   Procedure: CARDIOVERSION;  Surgeon: Dorothy Spark, MD;  Location: [facility] ENDOSCOPY;  Service: Cardiovascular;  Laterality: N/A;  . COLONOSCOPY  07/08/2020  . LIPOMA EXCISION Right 06/19/2020   Procedure: EXCISION SUBCUTANEOUS LIPOMA RIGHT SHOULDER;  Surgeon: Donnie Mesa, MD;  Location: [facility];  Service: General;  Laterality: Right;  . TEE WITHOUT CARDIOVERSION N/A 12/07/2018   Procedure: TRANSESOPHAGEAL ECHOCARDIOGRAM (TEE);  Surgeon: Dorothy Spark, MD;  Location[facility] ENDOSCOPY;  Service: Cardiovascular;  Laterality: N/A;     Current Outpatient Medications  Medication Sig Dispense Refill  . apixaban (ELIQUIS) 5 MG TABS tablet Take 1 tablet (5  mg total) by mouth 2 (two) times daily. 180 tablet 1  . aspirin-acetaminophen-caffeine (EXCEDRIN MIGRAINE) 250-250-65 MG tablet Take 1 tablet by mouth every 6 (six) hours as needed for headache.    . diltiazem (CARDIZEM CD) 300 MG 24 hr capsule TAKE 1 CAPSULE BY MOUTH EVERY DAY (Patient taking differently: Take 300 mg by mouth daily.) 90 capsule 2  . lisinopril (ZESTRIL) 5 MG tablet TAKE 1 TABLET BY MOUTH EVERY DAY (Patient taking differently: Take 5 mg by mouth daily.) 90 tablet 2   No current  facility-administered medications for this visit.    Allergies:   Tensilon [edrophonium]    Social History:  The patient  reports that he has been smoking cigarettes. He has a 10.50 pack-year smoking history. He has never used smokeless tobacco. He reports that he does not drink alcohol and does not use drugs.   Family History:  The patient's family history includes Alcohol abuse in an other family member; Arthritis in some other family members; Breast cancer in an other family member; Diabetes in his sister; Heart attack in his father; Heart disease in his sister and other family members; Hypertension in an other family member; Kidney disease in an other family member; Mental illness in his sister and another family member; Stroke in his father and another family member.    ROS:  Please see the history of present illness.   Otherwise, review of systems are positive for feeling skipped beats.   All other systems are reviewed and negative.    PHYSICAL EXAM: VS:  BP 138/78   Pulse 66   Ht 5\' 9"  (1.753 m)   Wt 153 lb (69.4 kg)   SpO2 95%   BMI 22.59 kg/m  , BMI Body mass index is 22.59 kg/m. GEN: Well nourished, well developed, in no acute distress  HEENT: normal  Neck: no JVD, carotid bruits, or masses Cardiac: RRR; 2/6 early systolic murmurs, no rubs, or gallops,no edema  Respiratory:  clear to auscultation bilaterally, normal work of breathing GI: soft, nontender, nondistended, + BS MS: no deformity or atrophy  Skin: warm and dry, no rash Neuro:  Strength and sensation are intact Psych: euthymic mood, full affect   EKG:   The ekg ordered 7/21 demonstrates NSR, LVH with associated ST changes   Recent Labs: 09/06/2019: Magnesium 2.1 06/19/2020: BUN 12; Creatinine, Ser 1.16; Hemoglobin 13.7; Platelets 235; Potassium 3.6; Sodium 138   Lipid Panel    Component Value Date/Time   CHOL 161 12/04/2018 0859   CHOL 176 09/05/2018 1000   TRIG 53 12/04/2018 0859   HDL 43 12/04/2018  0859   HDL 54 09/05/2018 1000   CHOLHDL 3.7 12/04/2018 0859   VLDL 11 12/04/2018 0859   LDLCALC 107 (H) 12/04/2018 0859   LDLCALC 113 (H) 09/05/2018 1000     Other studies Reviewed: Additional studies/ records that were reviewed today with results demonstrating: Hbg 13.6 in 11/21.     ASSESSMENT AND PLAN:  1. Atrial flutter: Maintaining NSR by exam.  Eliquis for stroke prevention.  2. HTN: The current medical regimen is effective;  continue present plan and medications.  Low salt diet.   He pretty much eats what he wants and does not gain weight. 3. Tobacco abuse: Still needs to quit.  He has decreased to 3/day. 4. Chronic systolic heart failure: Appears euvolemic. 5. Anticoagulated: increased risk of stroke from AFlutter.  No bleeding issues.    Current medicines are reviewed at length with the patient today.  The patient concerns regarding his medicines were addressed.  The following changes have been made:  No change  Labs/ tests ordered today include:  No orders of the defined types were placed in this encounter.   Recommend 150 minutes/week of aerobic exercise Low fat, low carb, high fiber diet recommended  Disposition:   FU in 6 months with APP   Signed, [name], MD  07/15/2020 8:11 AM    [facility] [address], [place], [place]  [address] Phone: [phone]; Fax: [phone]

## 2020-07-15 NOTE — Patient Instructions (Signed)
Medication Instructions:  Your physician recommends that you continue on your current medications as directed. Please refer to the Current Medication list given to you today.  *If you need a refill on your cardiac medications before your next appointment, please call your pharmacy*   Lab Work: None today If you have labs (blood work) drawn today and your tests are completely normal, you will receive your results only by: Marland Kitchen MyChart Message (if you have MyChart) OR . A paper copy in the mail If you have any lab test that is abnormal or we need to change your treatment, we will call you to review the results.   Testing/Procedures: None today   Follow-Up: At [facility], you and your health needs are our priority.  As part of our continuing mission to provide you with exceptional heart care, we have created designated Provider Care Teams.  These Care Teams include your primary Cardiologist (physician) and Advanced Practice Providers (APPs -  Physician Assistants and Nurse Practitioners) who all work together to provide you with the care you need, when you need it.  We recommend signing up for the patient portal called "MyChart".  Sign up information is provided on this After Visit Summary.  MyChart is used to connect with patients for Virtual Visits (Telemedicine).  Patients are able to view lab/test results, encounter notes, upcoming appointments, etc.  Non-urgent messages can be sent to your provider as well.   To learn more about what you can do with MyChart, go to [email or web address].    Your next appointment:   6 month(s)  The format for your next appointment:   In Person  Provider:   Casandra Doffing, MD   Other Instructions You will receive a letter in the mail in about 5 months reminding you to make an appointment with our office.

## 2020-07-18 NOTE — Telephone Encounter (Signed)
Pt didn't come yesterday to sign pt assistance forms. Called pt and he did not answer.

## 2020-07-24 NOTE — Telephone Encounter (Signed)
Called pt again, states he forgot to come in the other day. Will come in tomorrow to fill out Eliquis patient assistance. Also needs more samples.

## 2020-07-25 MED ORDER — APIXABAN 5 MG PO TABS: 5.0000 mg | ORAL_TABLET | Freq: Two times a day (BID) | ORAL | 0 refills | Status: DC

## 2020-07-25 NOTE — Telephone Encounter (Signed)
Pt came to clinic today and signed BMS application form for Eliquis. Did not bring proof of household income, will try submitting anyway. Also provided pt with 3 weeks of Eliquis samples while we await decision.

## 2020-07-25 NOTE — Addendum Note (Signed)
Addended by: Hadasah Brugger E on: 07/25/2020 10:34 AM   Modules accepted: Orders

## 2020-08-08 ENCOUNTER — Telehealth: Payer: Self-pay

## 2020-08-08 NOTE — Telephone Encounter (Signed)
Pt calling inquiring about his pt assistance application. Pt would like Megan,RPH, to give hie a call back concerning this matter. Please address

## 2020-08-08 NOTE — Telephone Encounter (Signed)
Returned call to pt and left message. His Eliquis application was sent on 12/23 and I provided pt with BMS #[phone] last week to call to inquire on status of his application.

## 2020-08-13 ENCOUNTER — Other Ambulatory Visit: Payer: Self-pay | Admitting: *Deleted

## 2020-08-13 NOTE — Telephone Encounter (Signed)
Called to follow up with BMS application, they stated they never received it. Application has been faxed again. Tried calling pt again, no answer.

## 2020-08-13 NOTE — Telephone Encounter (Signed)
Pt called back, needs more Eliquis samples while pt assistance form is still pending. 3 weeks left up front for pt.

## 2020-08-21 ENCOUNTER — Telehealth: Payer: Self-pay | Admitting: Pharmacist

## 2020-08-21 NOTE — Telephone Encounter (Signed)
Called BMS for status update on Eliquis application, pt was approved yesterday 08/20/20 through 08/19/21. Called pt to let him know, he was very appreciative for the assistance. Provided him with pharmacy # to coordinate shipments [phone].

## 2020-09-06 ENCOUNTER — Ambulatory Visit: Payer: Self-pay | Admitting: Nurse Practitioner

## 2020-09-16 ENCOUNTER — Ambulatory Visit: Payer: Self-pay | Admitting: Nurse Practitioner

## 2020-11-01 ENCOUNTER — Other Ambulatory Visit: Payer: Self-pay

## 2020-11-01 ENCOUNTER — Encounter (HOSPITAL_COMMUNITY): Payer: Self-pay

## 2020-11-01 ENCOUNTER — Emergency Department (HOSPITAL_COMMUNITY)
Admission: EM | Admit: 2020-11-01 | Discharge: 2020-11-01 | Disposition: A | Discharge status: Home / Self Care | Payer: Self-pay | Attending: Emergency Medicine | Admitting: Emergency Medicine

## 2020-11-01 DIAGNOSIS — F17210 Nicotine dependence, cigarettes, uncomplicated: Secondary | ICD-10-CM | POA: Insufficient documentation

## 2020-11-01 DIAGNOSIS — I110 Hypertensive heart disease with heart failure: Secondary | ICD-10-CM | POA: Insufficient documentation

## 2020-11-01 DIAGNOSIS — K047 Periapical abscess without sinus: Secondary | ICD-10-CM | POA: Insufficient documentation

## 2020-11-01 DIAGNOSIS — Z7901 Long term (current) use of anticoagulants: Secondary | ICD-10-CM | POA: Insufficient documentation

## 2020-11-01 DIAGNOSIS — Z955 Presence of coronary angioplasty implant and graft: Secondary | ICD-10-CM | POA: Insufficient documentation

## 2020-11-01 DIAGNOSIS — Z7982 Long term (current) use of aspirin: Secondary | ICD-10-CM | POA: Insufficient documentation

## 2020-11-01 DIAGNOSIS — I4891 Unspecified atrial fibrillation: Secondary | ICD-10-CM | POA: Insufficient documentation

## 2020-11-01 DIAGNOSIS — Z79899 Other long term (current) drug therapy: Secondary | ICD-10-CM | POA: Insufficient documentation

## 2020-11-01 DIAGNOSIS — I5031 Acute diastolic (congestive) heart failure: Secondary | ICD-10-CM | POA: Insufficient documentation

## 2020-11-01 MED ORDER — AMOXICILLIN-POT CLAVULANATE 875-125 MG PO TABS
1.0000 | ORAL_TABLET | Freq: Once | ORAL | Status: AC
  Administered 2020-11-01: 1 via ORAL
  Filled 2020-11-01: qty 1

## 2020-11-01 MED ORDER — AMOXICILLIN-POT CLAVULANATE 875-125 MG PO TABS: 1.0000 | ORAL_TABLET | Freq: Two times a day (BID) | ORAL | 0 refills | Status: DC

## 2020-11-01 MED ORDER — ACETAMINOPHEN 325 MG PO TABS
650.0000 mg | ORAL_TABLET | Freq: Once | ORAL | Status: AC
  Administered 2020-11-01: 650 mg via ORAL
  Filled 2020-11-01: qty 2

## 2020-11-01 NOTE — ED Triage Notes (Signed)
Pt endorses lower dental pain that began today. Pt reports pain is unrelieved by OTC medication.

## 2020-11-01 NOTE — Discharge Instructions (Addendum)
Please take antibiotics as prescribed.  Please follow-up with the dentist that you have followed with.  If you are unable to follow-up with the dentist you may call Dr. Geralynn Ochs who I have given you the office information for. It is recommended that you do not take ibuprofen for your dental pain given that you are on a blood thinner.  Please use Tylenol 1000 g every 6 hours.  Warm compresses and the lidocaine and Orajel.

## 2020-11-01 NOTE — ED Provider Notes (Signed)
[facility] DEPT Provider Note   CSN: [id number] Arrival date & time: 11/01/20  2136     History   Chief Complaint Chief Complaint  Patient presents with  . Dental Pain    HPI Terry Golden. is a 55 y.o. male.  Patient presents to the emergency department with a dental complaint. Symptoms began 1 day ago. The patient has tried to alleviate pain with ibuprofen.  Pain rated as 10/10, characterized as throbbing in nature and located R lower gumline. Patient denies fever, night sweats, chills, difficulty swallowing or opening mouth, SOB, nuchal rigidity or decreased ROM of neck.  Patient does not have a dentist and requests a resource guide at discharge.   Patient states that he has a dentist to follow-up with.  He states he is not having an appointment however.     The history is provided by the patient.  Dental Injury This is a new problem. The problem occurs constantly. The problem has not changed since onset.Treatments tried: NSAID / ibuprofen. The treatment provided moderate relief.    Past Medical History:  Diagnosis Date  . ASD (atrial septal defect)    large   . Congenital heart disease    ASVD, Cleft Mitral Valve  . Dysrhythmia    aflutter  . Echocardiogram    Echo 01/2019: EF 45-50, mod basal septal hypertrophy, Gr 2 DD, s/p VSD closure with no residual shunt,s/p MV ring with mod MR, s/p ASD closure with no residual shunt  . Heart murmur    from Rheumatic Fever  . Hypertension   . 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 Mitral valve disease 03/25/1999   s/p mitral valve repair with commissuroplasty   . Rheumatic fever   . Syncope    1999  . Typical atrial flutter ([place])   . VSD (ventricular septal defect)    small    Patient Active Problem List   Diagnosis Date Noted  . Secondary hypercoagulable state ([place]) 10/23/2019  . Atypical atrial flutter ([place])   . Acute diastolic CHF (congestive heart failure), NYHA class 3 ([place])    . Atrial fibrillation with RVR ([place]) 12/04/2018  . Mitral valve disorder   . Shortness of breath 07/16/2015  . Dyspnea 07/16/2015  . Positional lightheadedness 07/16/2015  . Pain in the chest   . Weight loss 03/17/2012  . Smoker 03/17/2012  . 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  . ASD (atrial septal defect)   . VSD (ventricular septal defect)   . Mitral valve disease   . Syncope   . Congenital heart disease   . Preventative health care 03/11/2012    Past Surgical History:  Procedure Laterality Date  . CARDIAC CATHETERIZATION N/A 09/20/2015   Procedure: Right/Left Heart Cath and Coronary Angiography;  Surgeon: Jettie Booze, MD;  Location: [place] [facility];  Service: Cardiovascular;  Laterality: N/A;  . CARDIAC SURGERY     s/p partial AVSD repair and cleft mitral valve repair with commissuroplasty  . CARDIAC VALVE REPLACEMENT    . CARDIOVERSION N/A 12/07/2018   Procedure: CARDIOVERSION;  Surgeon: Dorothy Spark, MD;  Location: [facility] ENDOSCOPY;  Service: Cardiovascular;  Laterality: N/A;  . COLONOSCOPY  07/08/2020  . LIPOMA EXCISION Right 06/19/2020   Procedure: EXCISION SUBCUTANEOUS LIPOMA RIGHT SHOULDER;  Surgeon: Donnie Mesa, MD;  Location: [facility];  Service: General;  Laterality: Right;  . TEE WITHOUT CARDIOVERSION N/A 12/07/2018   Procedure: TRANSESOPHAGEAL ECHOCARDIOGRAM (TEE);  Surgeon: Dorothy Spark, MD;  Location: [facility] ENDOSCOPY;  Service: Cardiovascular;  Laterality: N/A;        Home Medications    Prior to Admission medications   Medication Sig Start Date End Date Taking? Authorizing Provider  apixaban (ELIQUIS) 5 MG TABS tablet Take 1 tablet (5 mg total) by mouth 2 (two) times daily. 07/01/20   Theora Gianotti, NP  aspirin-acetaminophen-caffeine (EXCEDRIN MIGRAINE) [phone] MG tablet Take 1 tablet by mouth every 6 (six) hours as needed for headache.    [provider]  diltiazem (CARDIZEM CD) 300 MG 24 hr capsule TAKE 1 CAPSULE BY MOUTH  EVERY DAY Patient taking differently: Take 300 mg by mouth daily. 04/15/20   Jettie Booze, MD  lisinopril (ZESTRIL) 5 MG tablet TAKE 1 TABLET BY MOUTH EVERY DAY Patient taking differently: Take 5 mg by mouth daily. 04/15/20   Jettie Booze, MD    Family History Family History  Problem Relation Age of Onset  . Diabetes Sister   . Mental illness Sister   . Heart disease Sister   . Arthritis Other   . Hypertension Other   . Alcohol abuse Other   . Heart attack Father   . Stroke Father   . Heart disease Other   . Arthritis Other   . Heart disease Other   . Stroke Other   . Mental illness Other   . Kidney disease Other   . Breast cancer Other        aunts  . Colon cancer Neg Hx   . Colon polyps Neg Hx   . Esophageal cancer Neg Hx   . Rectal cancer Neg Hx   . Stomach cancer Neg Hx     Social History Social History   Tobacco Use  . Smoking status: Current Every Day Smoker    Packs/day: 0.25    Years: 42.00    Pack years: 10.50    Types: Cigarettes  . Smokeless tobacco: Never Used  . Tobacco comment: 3 cigarettes per day( 07/08/20)  Vaping Use  . Vaping Use: Never used  Substance Use Topics  . Alcohol use: Never    Alcohol/week: 0.0 standard drinks  . Drug use: No     Allergies   Tensilon [edrophonium]   Review of Systems DENTAL PAIN present Denies fevers, chills, difficulty swallowing or eating, changes in voice, pain under tongue, nausea, vomiting, lightheadedness or dizziness. No trismus   Physical Exam Updated Vital Signs BP (!) 175/79 (BP Location: Right Arm)   Pulse 75   Temp 98.7 F (37.1 C) (Oral)   Resp 18   SpO2 100%   Physical Exam Physical Exam  Constitutional: Pt appears well-developed and well-nourished.  HENT:  Head: Normocephalic.  Right Ear: Tympanic membrane, external ear and ear canal normal.  Left Ear: Tympanic membrane, external ear and ear canal normal.  Nose: Nose normal. Right sinus exhibits no maxillary sinus  tenderness and no frontal sinus tenderness. Left sinus exhibits no maxillary sinus tenderness and no frontal sinus tenderness.  Mouth/Throat: Uvula is midline, oropharynx is clear and moist and mucous membranes are normal. No oral lesions. No uvula swelling or lacerations. No oropharyngeal exudate, posterior oropharyngeal edema, posterior oropharyngeal erythema or tonsillar abscesses.  Poor dentition throughout No gingival swelling, fluctuance or induration No gross abscess  No sublingual edema, tenderness to palpation, or sign of Ludwig's angina, or deep space infection Pain at left lower gumline no focal swelling or ulceration There are multiple caries.  Right lower molar is rotten flush with  the gumline.  Exceedingly/excruciatingly painful with palpation of rotten teeth in the lower left jaw. Eyes: Conjunctivae are normal. Pupils are equal, round, and reactive to light. Right eye exhibits no discharge. Left eye exhibits no discharge.  Neck: Normal range of motion. Neck supple.  No stridor Handling secretions without difficulty No nuchal rigidity No cervical lymphadenopathy Cardiovascular: Normal rate, regular rhythm and normal heart sounds.   Pulmonary/Chest: Effort normal. No respiratory distress.  Equal chest rise  Abdominal: Soft. Bowel sounds are normal. Pt exhibits no distension. There is no tenderness.  Lymphadenopathy: Pt has no cervical adenopathy.  Neurological: Pt is alert and oriented x 4  Skin: Skin is warm and dry.  Psychiatric: Pt has a normal mood and affect.  Nursing note and vitals reviewed.   ED Treatments / Results  Labs (all labs ordered are listed, but only abnormal results are displayed) Labs Reviewed - No data to display  EKG    Radiology No results found.  Procedures Procedures (including critical care time)  Medications Ordered in ED Medications - No data to display   Initial Impression / Assessment and Plan / ED Course  I have reviewed the  triage vital signs and the nursing notes.  Pertinent labs & imaging results that were available during my care of the patient were reviewed by me and considered in my medical decision making (see chart for details).        Patient with dentalgia.  No abscess requiring immediate incision and drainage.  Exam not concerning for Ludwig's angina or pharyngeal abscess.  Will treat with augmentin. Pt instructed to follow-up with dentist.  Discussed return precautions. Pt safe for discharge.  Patient discharged after first dose of Augmentin and 1 dose of Tylenol here.  I also provided him with lidocaine lollipops.  With information for on-call dentistry.  Final Clinical Impressions(s) / ED Diagnoses   Final diagnoses:  Dental infection    ED Discharge Orders    None        Tedd Sias, [place] 11/01/20 2238    Daleen Bo, MD 11/04/20 1010

## 2020-11-08 ENCOUNTER — Other Ambulatory Visit: Payer: Self-pay | Admitting: *Deleted

## 2020-11-08 MED ORDER — APIXABAN 5 MG PO TABS: 5.0000 mg | ORAL_TABLET | Freq: Two times a day (BID) | ORAL | 1 refills | Status: DC

## 2020-11-08 NOTE — Telephone Encounter (Signed)
Prescription refill request for Eliquis received.  Indication: Aflutter Last office visit: Var[place], 07/15/2020 Scr: 1.16, 06/19/2020 Age: 55 yo  Weight: 69.4 kg   Pt is on the correct dose of Eliquis per dosing criteria, prescription refill sent for Eliquis 5mg  BID.

## 2020-11-12 ENCOUNTER — Telehealth: Payer: Self-pay | Admitting: Interventional Cardiology

## 2020-11-12 MED ORDER — AMOXICILLIN 500 MG PO CAPS: ORAL_CAPSULE | ORAL | 3 refills | Status: DC

## 2020-11-12 NOTE — Telephone Encounter (Signed)
   [facility] Pre-operative Risk Assessment    HEARTCARE STAFF: - Please ensure there is not already an duplicate clearance open for this procedure. - Under Visit Info/Reason for Call, type in Other and utilize the format Clearance MM/DD/YY or Clearance TBD. Do not use dashes or single digits. - If request is for dental extraction, please clarify the # of teeth to be extracted.  Request for surgical clearance:  1. What type of surgery is being performed?  30 teeth extractions   2. When is this surgery scheduled?  11/19/2020  3. What type of clearance is required (medical clearance vs. Pharmacy clearance to hold med vs. Both)?  Medical  4. Are there any medications that need to be held prior to surgery and how long?  Eliquis 3-4 days prior Already advised stopped blood thinners for a week   5. Practice name and name of physician performing surgery?  [facility] DR. Tamala Bari  6. What is the office phone number? [phone]    7.   What is the office fax number? [phone]  8.   Anesthesia type (None, local, MAC, general) ? General and nitrous   Terry Golden 11/12/2020, 11:22 AM  _________________________________________________________________   (provider comments below)

## 2020-11-12 NOTE — Addendum Note (Signed)
Addended by: Lorel Monaco A on: 11/12/2020 02:19 PM   Modules accepted: Orders

## 2020-11-12 NOTE — Telephone Encounter (Signed)
Please comment on Eliquis, thanks!

## 2020-11-12 NOTE — Telephone Encounter (Signed)
Patient with diagnosis of atrial flutter on Eliquis for anticoagulation. PMH significant for congenital heart diease  with history of mitral valve repair as well as repair of atrioventricular septal defect in 2000.  Procedure: 30 teeth extractions Date of procedure: 11/19/20  CHA2DS2-VASc Score = 3  This indicates a 3.2% annual risk of stroke. The patient's score is based upon: CHF History: Yes HTN History: Yes Diabetes History: No Stroke History: No Vascular Disease History: Yes Age Score: 0 Gender Score: 0   CrCl 71 ml/min Platelet count 235K  Patient does require pre-op antibiotics for dental procedure. Please let patient know we have sent prescription for amoxicillin 2 g x1 to [facility].  Per office protocol, patient can hold Eliquis for 3 days prior to procedure.

## 2020-11-13 NOTE — Telephone Encounter (Signed)
Left VM

## 2020-11-18 NOTE — Telephone Encounter (Signed)
   Name: Terry Golden.  DOB: Jun 26, 1966  MRN: [id number]   Primary Cardiologist: [name], MD  Chart reviewed as part of pre-operative protocol coverage. Patient was contacted 11/18/2020 in reference to pre-operative risk assessment for pending surgery as outlined below.  Terry Golden. was last seen on 07/15/2020 by Dr. [name].  Since that day, [name]. has done well without chest pain or worsening dyspnea.  Previous cardiac catheterization in 2017 showed minimal CAD.  Therefore, based on ACC/AHA guidelines, the patient would be at acceptable risk for the planned procedure without further cardiovascular testing.   Patient is cleared to proceed with the procedure after holding Eliquis for 3 days.  We will defer the earliest restarting time of Eliquis to the surgeon who does the procedure.  SBE prophylaxis include 2000 mg of amoxicillin has been sent to his pharmacy, this will need to be taken 1 hour prior to his intended procedure.  The patient was advised that if he develops new symptoms prior to surgery to contact our office to arrange for a follow-up visit, and he verbalized understanding.  I will route this recommendation to the requesting party via Epic fax function and remove from pre-op pool. Please call with questions.  [place], [place] 11/18/2020, 1:00 PM

## 2020-12-14 ENCOUNTER — Encounter (HOSPITAL_COMMUNITY): Payer: Self-pay | Admitting: Emergency Medicine

## 2020-12-14 ENCOUNTER — Other Ambulatory Visit: Payer: Self-pay

## 2020-12-14 ENCOUNTER — Emergency Department (HOSPITAL_COMMUNITY): Payer: Self-pay

## 2020-12-14 ENCOUNTER — Inpatient Hospital Stay (HOSPITAL_COMMUNITY)
Admission: EM | Admit: 2020-12-14 | Discharge: 2020-12-16 | DRG: 391 | Disposition: A | Discharge status: Home / Self Care | Payer: Self-pay | Attending: Internal Medicine | Admitting: Internal Medicine

## 2020-12-14 DIAGNOSIS — I340 Nonrheumatic mitral (valve) insufficiency: Secondary | ICD-10-CM | POA: Diagnosis present

## 2020-12-14 DIAGNOSIS — I5032 Chronic diastolic (congestive) heart failure: Secondary | ICD-10-CM | POA: Diagnosis present

## 2020-12-14 DIAGNOSIS — D649 Anemia, unspecified: Secondary | ICD-10-CM | POA: Diagnosis present

## 2020-12-14 DIAGNOSIS — Q249 Congenital malformation of heart, unspecified: Secondary | ICD-10-CM

## 2020-12-14 DIAGNOSIS — K2990 Gastroduodenitis, unspecified, without bleeding: Secondary | ICD-10-CM | POA: Diagnosis present

## 2020-12-14 DIAGNOSIS — Z833 Family history of diabetes mellitus: Secondary | ICD-10-CM

## 2020-12-14 DIAGNOSIS — G2581 Restless legs syndrome: Secondary | ICD-10-CM | POA: Diagnosis present

## 2020-12-14 DIAGNOSIS — Q210 Ventricular septal defect: Secondary | ICD-10-CM

## 2020-12-14 DIAGNOSIS — I484 Atypical atrial flutter: Secondary | ICD-10-CM | POA: Diagnosis present

## 2020-12-14 DIAGNOSIS — U071 COVID-19: Secondary | ICD-10-CM | POA: Diagnosis present

## 2020-12-14 DIAGNOSIS — I4891 Unspecified atrial fibrillation: Secondary | ICD-10-CM | POA: Diagnosis present

## 2020-12-14 DIAGNOSIS — K2970 Gastritis, unspecified, without bleeding: Principal | ICD-10-CM | POA: Diagnosis present

## 2020-12-14 DIAGNOSIS — Z888 Allergy status to other drugs, medicaments and biological substances status: Secondary | ICD-10-CM

## 2020-12-14 DIAGNOSIS — Z8249 Family history of ischemic heart disease and other diseases of the circulatory system: Secondary | ICD-10-CM

## 2020-12-14 DIAGNOSIS — I472 Ventricular tachycardia: Secondary | ICD-10-CM | POA: Diagnosis present

## 2020-12-14 DIAGNOSIS — Z7901 Long term (current) use of anticoagulants: Secondary | ICD-10-CM

## 2020-12-14 DIAGNOSIS — R55 Syncope and collapse: Secondary | ICD-10-CM | POA: Diagnosis present

## 2020-12-14 DIAGNOSIS — K449 Diaphragmatic hernia without obstruction or gangrene: Secondary | ICD-10-CM | POA: Diagnosis present

## 2020-12-14 DIAGNOSIS — D509 Iron deficiency anemia, unspecified: Secondary | ICD-10-CM | POA: Diagnosis present

## 2020-12-14 DIAGNOSIS — F17210 Nicotine dependence, cigarettes, uncomplicated: Secondary | ICD-10-CM | POA: Diagnosis present

## 2020-12-14 DIAGNOSIS — Q211 Atrial septal defect, unspecified: Secondary | ICD-10-CM

## 2020-12-14 DIAGNOSIS — I059 Rheumatic mitral valve disease, unspecified: Secondary | ICD-10-CM | POA: Diagnosis present

## 2020-12-14 DIAGNOSIS — Z823 Family history of stroke: Secondary | ICD-10-CM

## 2020-12-14 DIAGNOSIS — I110 Hypertensive heart disease with heart failure: Secondary | ICD-10-CM | POA: Diagnosis present

## 2020-12-14 DIAGNOSIS — Z79899 Other long term (current) drug therapy: Secondary | ICD-10-CM

## 2020-12-14 LAB — TROPONIN I (HIGH SENSITIVITY)
Troponin I (High Sensitivity): 4 ng/L (ref <18)
Troponin I (High Sensitivity): 5 ng/L (ref <18)

## 2020-12-14 LAB — DIFFERENTIAL
Abs Immature Granulocytes: 0.01 10*3/uL (ref 0.00–0.07)
Basophils Absolute: 0 10*3/uL (ref 0.0–0.1)
Basophils Relative: 0 %
Eosinophils Absolute: 0 10*3/uL (ref 0.0–0.5)
Eosinophils Relative: 1 %
Immature Granulocytes: 0 %
Lymphocytes Relative: 19 %
Lymphs Abs: 0.9 10*3/uL (ref 0.7–4.0)
Monocytes Absolute: 0.2 10*3/uL (ref 0.1–1.0)
Monocytes Relative: 5 %
Neutro Abs: 3.4 10*3/uL (ref 1.7–7.7)
Neutrophils Relative %: 75 %

## 2020-12-14 LAB — CBC
HCT: 13.8 % — ABNORMAL LOW (ref 39.0–52.0)
HCT: 13.8 % — ABNORMAL LOW (ref 39.0–52.0)
Hemoglobin: 3.9 g/dL — CL (ref 13.0–17.0)
Hemoglobin: 3.9 g/dL — CL (ref 13.0–17.0)
MCH: 23.8 pg — ABNORMAL LOW (ref 26.0–34.0)
MCH: 23.4 pg — ABNORMAL LOW (ref 26.0–34.0)
MCHC: 28.3 g/dL — ABNORMAL LOW (ref 30.0–36.0)
MCHC: 28.3 g/dL — ABNORMAL LOW (ref 30.0–36.0)
MCV: 84.1 fL (ref 80.0–100.0)
MCV: 82.6 fL (ref 80.0–100.0)
Platelets: 267 10*3/uL (ref 150–400)
Platelets: 298 10*3/uL (ref 150–400)
RBC: 1.64 MIL/uL — ABNORMAL LOW (ref 4.22–5.81)
RBC: 1.67 MIL/uL — ABNORMAL LOW (ref 4.22–5.81)
RDW: 17.1 % — ABNORMAL HIGH (ref 11.5–15.5)
RDW: 17.1 % — ABNORMAL HIGH (ref 11.5–15.5)
WBC: 4.5 10*3/uL (ref 4.0–10.5)
WBC: 5.1 10*3/uL (ref 4.0–10.5)
nRBC: 0 % (ref 0.0–0.2)
nRBC: 0 % (ref 0.0–0.2)

## 2020-12-14 LAB — COMPREHENSIVE METABOLIC PANEL
ALT: 10 U/L (ref 0–44)
AST: 25 U/L (ref 15–41)
Albumin: 3.2 g/dL — ABNORMAL LOW (ref 3.5–5.0)
Alkaline Phosphatase: 38 U/L (ref 38–126)
Anion gap: 5 (ref 5–15)
BUN: 37 mg/dL — ABNORMAL HIGH (ref 6–20)
CO2: 21 mmol/L — ABNORMAL LOW (ref 22–32)
Calcium: 8.5 mg/dL — ABNORMAL LOW (ref 8.9–10.3)
Chloride: 110 mmol/L (ref 98–111)
Creatinine, Ser: 1.08 mg/dL (ref 0.61–1.24)
GFR, Estimated: >60 mL/min (ref >60)
Glucose, Bld: 111 mg/dL — ABNORMAL HIGH (ref 70–99)
Potassium: 4.9 mmol/L (ref 3.5–5.1)
Sodium: 136 mmol/L (ref 135–145)
Total Bilirubin: 0.3 mg/dL (ref 0.3–1.2)
Total Protein: 5.8 g/dL — ABNORMAL LOW (ref 6.5–8.1)

## 2020-12-14 LAB — RETICULOCYTES
Immature Retic Fract: 40.6 % — ABNORMAL HIGH (ref 2.3–15.9)
RBC.: 1.54 MIL/uL — ABNORMAL LOW (ref 4.22–5.81)
Retic Count, Absolute: 31.4 10*3/uL (ref 19.0–186.0)
Retic Ct Pct: 2 % (ref 0.4–3.1)

## 2020-12-14 LAB — RESP PANEL BY RT-PCR (FLU A&B, COVID) ARPGX2
Influenza A by PCR: NEGATIVE
Influenza B by PCR: NEGATIVE
SARS Coronavirus 2 by RT PCR: POSITIVE — AB

## 2020-12-14 LAB — FERRITIN: Ferritin: 2 ng/mL — ABNORMAL LOW (ref 24–336)

## 2020-12-14 LAB — POC OCCULT BLOOD, ED: Fecal Occult Bld: NEGATIVE

## 2020-12-14 LAB — IRON AND TIBC
Iron: 13 ug/dL — ABNORMAL LOW (ref 45–182)
Saturation Ratios: 4 % — ABNORMAL LOW (ref 17.9–39.5)
TIBC: 352 ug/dL (ref 250–450)
UIBC: 339 ug/dL

## 2020-12-14 LAB — PREPARE RBC (CROSSMATCH)

## 2020-12-14 LAB — ABO/RH: ABO/RH(D): O POS

## 2020-12-14 MED ORDER — DILTIAZEM HCL ER COATED BEADS 180 MG PO CP24
300.0000 mg | ORAL_CAPSULE | Freq: Every day | ORAL | Status: DC
  Administered 2020-12-15 – 2020-12-16 (×2): 300 mg via ORAL
  Filled 2020-12-14 (×2): qty 1

## 2020-12-14 MED ORDER — LACTATED RINGERS IV SOLN: INTRAVENOUS | Status: DC

## 2020-12-14 MED ORDER — ACETAMINOPHEN 325 MG PO TABS: 650.0000 mg | ORAL_TABLET | Freq: Four times a day (QID) | ORAL | Status: DC | PRN

## 2020-12-14 MED ORDER — SODIUM CHLORIDE 0.9 % IV SOLN: 10.0000 mL/h | Freq: Once | INTRAVENOUS | Status: DC

## 2020-12-14 MED ORDER — ACETAMINOPHEN 650 MG RE SUPP: 650.0000 mg | Freq: Four times a day (QID) | RECTAL | Status: DC | PRN

## 2020-12-14 MED ORDER — LACTATED RINGERS IV BOLUS
1000.0000 mL | Freq: Once | INTRAVENOUS | Status: AC
  Administered 2020-12-14: 1000 mL via INTRAVENOUS

## 2020-12-14 MED ORDER — SODIUM CHLORIDE 0.9% FLUSH
3.0000 mL | Freq: Two times a day (BID) | INTRAVENOUS | Status: DC
  Administered 2020-12-15 – 2020-12-16 (×2): 3 mL via INTRAVENOUS

## 2020-12-14 NOTE — H&P (Addendum)
History and Physical   Terry Golden. GUR:[id number] DOB: October 01, 1965 DOA: 12/14/2020  PCP: [name], NP   Patient coming from: Home  Chief Complaint: Weakness, dizziness, passing out  HPI: Terry Golden. is a 55 y.o. male with medical history significant of CHF, ASD, VSD, A. fib/flutter, mitral valve defect status post repair who presents with ongoing weakness and dizziness with episode of passing out.  Patient reports he has had 3 to 4 weeks of intermittent episodes of weakness and dizziness.  He has been feeling significantly worse for the past day or 2 and passed out earlier.  He states that he did not hit his head when he fell and has no residual pain.  He denies any dark or bloody stools.  He states that multiple members of his family have [place] and they have known him since a week ago.  He has had no symptoms other than shortness of breath due to his anemia that would be consistent with COVID.  He also reports having several teeth removed about 2 or 3 weeks ago.  He denies any significant bleeding postoperatively.  He states that he initially thought that some of his symptoms could have been due to his pain medication so he stopped taking that but he has continued to have the dizziness and lightheadedness and weakness. He states he is currently using a soft diet.  He reports some leg pain and restless leg.  He denies fevers, chills, chest pain, abdominal pain, constipation, nausea, vomiting.  ED Course: Vital signs in the ED are stable blood pressure maintaining in the 28B to 151V systolic.  Lab work-up showed CMP with bicarb 21, glucose 111, calcium 8.5, protein 5.8, albumin 3.2.  CBC with hemoglobin 3.9 and repeated with confirmed hemoglobin 3.9.  Troponin negative with repeat pending.  FOBT negative.  Respiratory panel positive for COVID.  Chest x-ray with no acute or maladies and cardiomegaly.  Patient received 1 L of LR and 2 units of PRBCs have been ordered.  Review of  Systems: As per HPI otherwise all other systems reviewed and are negative.  Past Medical History:  Diagnosis Date  . ASD (atrial septal defect)    large   . Congenital heart disease    ASVD, Cleft Mitral Valve  . Dysrhythmia    aflutter  . Echocardiogram    Echo 01/2019: EF 45-50, mod basal septal hypertrophy, Gr 2 DD, s/p VSD closure with no residual shunt,s/p MV ring with mod MR, s/p ASD closure with no residual shunt  . Heart murmur    from Rheumatic Fever  . Hypertension   . 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 Mitral valve disease 03/25/1999   s/p mitral valve repair with commissuroplasty   . Rheumatic fever   . Syncope    1999  . Typical atrial flutter ([place])   . VSD (ventricular septal defect)    small    Past Surgical History:  Procedure Laterality Date  . CARDIAC CATHETERIZATION N/A 09/20/2015   Procedure: Right/Left Heart Cath and Coronary Angiography;  Surgeon: Jettie Booze, MD;  Location: [place] [facility];  Service: Cardiovascular;  Laterality: N/A;  . CARDIAC SURGERY     s/p partial AVSD repair and cleft mitral valve repair with commissuroplasty  . CARDIAC VALVE REPLACEMENT    . CARDIOVERSION N/A 12/07/2018   Procedure: CARDIOVERSION;  Surgeon: Dorothy Spark, MD;  Location: [facility] ENDOSCOPY;  Service: Cardiovascular;  Laterality: N/A;  . COLONOSCOPY  07/08/2020  . LIPOMA EXCISION Right 06/19/2020   Procedure: EXCISION SUBCUTANEOUS LIPOMA RIGHT SHOULDER;  Surgeon: Donnie Mesa, MD;  Location: [facility];  Service: General;  Laterality: Right;  . TEE WITHOUT CARDIOVERSION N/A 12/07/2018   Procedure: TRANSESOPHAGEAL ECHOCARDIOGRAM (TEE);  Surgeon: Dorothy Spark, MD;  Location: [facility] ENDOSCOPY;  Service: Cardiovascular;  Laterality: N/A;    Social History  reports that he has been smoking cigarettes. He has a 10.50 pack-year smoking history. He has never used smokeless tobacco. He reports that he does not drink alcohol and does not use drugs.  Allergies   Allergen Reactions  . Tensilon [Edrophonium] Other (See Comments)    Blacked out     Family History  Problem Relation Age of Onset  . Diabetes Sister   . Mental illness Sister   . Heart disease Sister   . Arthritis Other   . Hypertension Other   . Alcohol abuse Other   . Heart attack Father   . Stroke Father   . Heart disease Other   . Arthritis Other   . Heart disease Other   . Stroke Other   . Mental illness Other   . Kidney disease Other   . Breast cancer Other        aunts  . Colon cancer Neg Hx   . Colon polyps Neg Hx   . Esophageal cancer Neg Hx   . Rectal cancer Neg Hx   . Stomach cancer Neg Hx   Reviewed on admission  Prior to Admission medications   Medication Sig Start Date End Date Taking? Authorizing Provider  amoxicillin (AMOXIL) 500 MG capsule Take 4 capsules by mouth 30-60 minutes prior to dental work 11/12/20   Jettie Booze, MD  apixaban (ELIQUIS) 5 MG TABS tablet Take 1 tablet (5 mg total) by mouth 2 (two) times daily. 11/08/20   Jettie Booze, MD  aspirin-acetaminophen-caffeine [facility] MIGRAINE) [phone] MG tablet Take 1 tablet by mouth every 6 (six) hours as needed for headache.    [provider]  diltiazem (CARDIZEM CD) 300 MG 24 hr capsule TAKE 1 CAPSULE BY MOUTH EVERY DAY Patient taking differently: Take 300 mg by mouth daily. 04/15/20   Jettie Booze, MD  lisinopril (ZESTRIL) 5 MG tablet TAKE 1 TABLET BY MOUTH EVERY DAY Patient taking differently: Take 5 mg by mouth daily. 04/15/20   Jettie Booze, MD    Physical Exam: Vitals:   12/14/20 1700 12/14/20 1730 12/14/20 1830 12/14/20 2009  BP: (!) 112/59 107/66 117/62 128/66  Pulse:  63 66 75  Resp: (!) 25 (!) 21 (!) 24   Temp:    99.5 F (37.5 C)  TempSrc:    Oral  SpO2: 100% 99% 98% 98%   Physical Exam Constitutional:      General: He is not in acute distress.    Appearance: Normal appearance.  HENT:     Head: Normocephalic and atraumatic.      Mouth/Throat:     Mouth: Mucous membranes are moist.     Pharynx: Oropharynx is clear.  Eyes:     Extraocular Movements: Extraocular movements intact.     Pupils: Pupils are equal, round, and reactive to light.  Cardiovascular:     Rate and Rhythm: Normal rate and regular rhythm.     Pulses: Normal pulses.     Heart sounds: Murmur heard.    Pulmonary:     Effort: Pulmonary effort is normal. No respiratory distress.     Breath  sounds: Normal breath sounds.  Abdominal:     General: Bowel sounds are normal. There is no distension.     Palpations: Abdomen is soft.     Tenderness: There is no abdominal tenderness.  Musculoskeletal:        General: No swelling or deformity.  Skin:    General: Skin is warm and dry.  Neurological:     General: No focal deficit present.     Mental Status: Mental status is at baseline.    Labs on Admission: I have personally reviewed following labs and imaging studies  CBC: Recent Labs  Lab 12/14/20 1555 12/14/20 1746  WBC 4.5 5.1  NEUTROABS 3.4  --   HGB 3.9* 3.9*  HCT 13.8* 13.8*  MCV 84.1 82.6  PLT 267 [id number]    Basic Metabolic Panel: Recent Labs  Lab 12/14/20 1540  NA 136  K 4.9  CL 110  CO2 21*  GLUCOSE 111*  BUN 37*  CREATININE 1.08  CALCIUM 8.5*    GFR: CrCl cannot be calculated (Unknown ideal weight.).  Liver Function Tests: Recent Labs  Lab 12/14/20 1540  AST 25  ALT 10  ALKPHOS 38  BILITOT 0.3  PROT 5.8*  ALBUMIN 3.2*    Urine analysis:    Component Value Date/Time   COLORURINE AMBER (A) 02/12/2020 1926   APPEARANCEUR CLEAR 02/12/2020 1926   LABSPEC 1.027 02/12/2020 1926   PHURINE 5.0 02/12/2020 1926   GLUCOSEU NEGATIVE 02/12/2020 1926   GLUCOSEU NEGATIVE 05/16/2012 0908   HGBUR NEGATIVE 02/12/2020 1926   BILIRUBINUR NEGATIVE 02/12/2020 [place] NEGATIVE 02/12/2020 1926   PROTEINUR 30 (A) 02/12/2020 1926   UROBILINOGEN 0.2 05/16/2012 0908   NITRITE NEGATIVE 02/12/2020 1926   LEUKOCYTESUR  SMALL (A) 02/12/2020 1926    Radiological Exams on Admission: DG Chest Port 1 View  Result Date: 12/14/2020 CLINICAL DATA:  Cough EXAM: PORTABLE CHEST 1 VIEW COMPARISON:  02/12/2020 FINDINGS: Cardiomegaly status post median sternotomy. Both lungs are clear. The visualized skeletal structures are unremarkable. IMPRESSION: Cardiomegaly without acute abnormality of the lungs in AP portable projection. Electronically Signed   By: Eddie Candle M.D.   On: 12/14/2020 14:17   EKG: Independently reviewed.  Sinus rhythm at 63 bpm, PAC, possible LVH, nonspecific T wave abnormalities in lateral leads.  Assessment/Plan Principal Problem:   Symptomatic anemia Active Problems:   ASD (atrial septal defect)   VSD (ventricular septal defect)   Mitral valve disease   Congenital heart disease   Atypical atrial flutter (HCC)   Chronic diastolic CHF (congestive heart failure) (HCC)  Symptomatic anemia > Patient presenting with weeks of intermittent weakness and dizziness and recent episode of passing out. > Found to have hemoglobin of 3.9 which was confirmed on repeat. > FOBT is negative and T bili is normal and patient does not report dark or bloody stools.  Do not suspect GI bleed given this unless it occurred previously and with normal T bili hemolysis is unlikely. > Given MCV is borderline low at 82 most likely etiology is iron deficiency though unclear etiology of iron deficiency.  He also does report some leg pain/restless leg. - Monitor on telemetry - Continue with 2 units transfusion and recheck hemoglobin after transfusion - Goal hemoglobin greater than 7 we will continue to transfuse if not at goal. - Add on iron studies and reticulocytes - Trend hemoglobin  COVID-19 infection > Multiple family members positive COVID-19.  Patient tested positive in ED. > Family first tested positive about  a week ago, so likely outside of window for treatment.  He is asymptomatic. Chest x-ray clear, afebrile. -  Continue to monitor on precautions  CHF HTN ASD, VSD Paroxysmal atrial flutter Mitral valve defect status post repair > History of diastolic heart failure.  Last echo in 2020 with EF 55-60% with LVH, normal RV function and mild to moderate mitral valve regurgitation. > Has congenital heart disease with possible ostium primum ASD and small residual VSD. > Is status post mitral valve repair with mild to moderate regurgitation on last echo. - Monitor on telemetry - Plan to resume diltiazem tomorrow as long as blood pressure improves/remained stable - Hold lisinopril for now - Holding home Eliquis in the setting of symptomatic anemia as we work to evaluate etiology - Not currently on any diuretics - We will continue to trend labs  DVT prophylaxis: SCDs Code Status:   Full  Family Communication:  None on admission Disposition Plan:   Patient is from:  Home  Anticipated DC to:  Home  Anticipated DC date:  1 to 3 days  Anticipated DC barriers: None  Consults called:  None  Admission status:  Observation, telemetry   Severity of Illness: The appropriate patient status for this patient is OBSERVATION. Observation status is judged to be reasonable and necessary in order to provide the required intensity of service to ensure the patient's safety. The patient's presenting symptoms, physical exam findings, and initial radiographic and laboratory data in the context of their medical condition is felt to place them at decreased risk for further clinical deterioration. Furthermore, it is anticipated that the patient will be medically stable for discharge from the hospital within 2 midnights of admission. The following factors support the patient status of observation.   " The patient's presenting symptoms include weakness, dizziness, episode of passing out. " The physical exam findings include murmur, otherwise stable physical exam. " The initial radiographic and laboratory data are hemoglobin 3.9,  bicarb 21, glucose 111, chest x-ray without acute abnormality but did show cardiomegaly, troponin normal, FOBT negative, respiratory panel positive for COVID.Mar[place] Kitchen   Marcelyn Bruins MD [facility]  How to contact the [facility] Attending or Consulting provider [place] or covering provider during after hours [place], for this patient?   1. Check the care team in [facility] and look for a) attending/consulting TR[facility] provider listed and b) the [facility] team listed 2. Log into [email or web address] and use [place]'s universal password to access. If you do not have the password, please contact the hospital operator. 3. Locate the [facility] provider you are looking for under [facility] and page to a number that you can be directly reached. 4. If you still have difficulty reaching the provider, please page the [facility] (Director on Call) for the Hospitalists listed on amion for assistance.  12/14/2020, 8:12 PM

## 2020-12-14 NOTE — ED Notes (Signed)
Dr Zenia Resides at the bedside discussing plan of care with the patient

## 2020-12-14 NOTE — ED Notes (Signed)
Hemocult collected- light brown stool noted

## 2020-12-14 NOTE — ED Notes (Signed)
Patient arouses easily but co feeling weak.  Patient has been sleeping most of the time while here.

## 2020-12-14 NOTE — ED Provider Notes (Signed)
[place] [facility] DEPT Provider Note   CSN: [id number] Arrival date & time: 12/14/20  1156     History No chief complaint on file.   Terry Hagos. is a 55 y.o. male.  55 year old male presents with several weeks of intermittent weakness and dizziness with associate diaphoresis.  States that the symptoms are worse in the morning at times and last for an undetermined amount of time.  No associated anginal type chest discomfort.  No recent changes to his medications.  No recent fever although possible COVID exposure.  States that he felt that he became anxious after all this started.  Today was so weak that when he tried to stand up he fell down but denies any injury from that.  Denies any dyspnea currently or on exertion.  Presents via EMS        Past Medical History:  Diagnosis Date  . ASD (atrial septal defect)    large   . Congenital heart disease    ASVD, Cleft Mitral Valve  . Dysrhythmia    aflutter  . Echocardiogram    Echo 01/2019: EF 45-50, mod basal septal hypertrophy, Gr 2 DD, s/p VSD closure with no residual shunt,s/p MV ring with mod MR, s/p ASD closure with no residual shunt  . Heart murmur    from Rheumatic Fever  . Hypertension   . 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 Mitral valve disease 03/25/1999   s/p mitral valve repair with commissuroplasty   . Rheumatic fever   . Syncope    1999  . Typical atrial flutter ([place])   . VSD (ventricular septal defect)    small    Patient Active Problem List   Diagnosis Date Noted  . Secondary hypercoagulable state ([place]) 10/23/2019  . Atypical atrial flutter ([place])   . Acute diastolic CHF (congestive heart failure), NYHA class 3 ([place])   . Atrial fibrillation with RVR ([place]) 12/04/2018  . Mitral valve disorder   . Shortness of breath 07/16/2015  . Dyspnea 07/16/2015  . Positional lightheadedness 07/16/2015  . Pain in the chest   . Weight loss 03/17/2012  . Smoker 03/17/2012  . Lipoma  of shoulder 03/17/2012  . ASD (atrial septal defect)   . VSD (ventricular septal defect)   . Mitral valve disease   . Syncope   . Congenital heart disease   . Preventative health care 03/11/2012    Past Surgical History:  Procedure Laterality Date  . CARDIAC CATHETERIZATION N/A 09/20/2015   Procedure: Right/Left Heart Cath and Coronary Angiography;  Surgeon: Jettie Booze, MD;  Location: [place] [facility] LAB;  Service: Cardiovascular;  Laterality: N/A;  . CARDIAC SURGERY     s/p partial AVSD repair and cleft mitral valve repair with commissuroplasty  . CARDIAC VALVE REPLACEMENT    . CARDIOVERSION N/A 12/07/2018   Procedure: CARDIOVERSION;  Surgeon: Dorothy Spark, MD;  [facility] ENDOSCOPY;  Service: Cardiovascular;  Laterality: N/A;  . COLONOSCOPY  07/08/2020  . LIPOMA EXCISION Right 06/19/2020   Procedure: EXCISION SUBCUTANEOUS LIPOMA RIGHT SHOULDER;  Surgeon: Donnie Mesa, MD;  Location: [place];  Service: General;  Laterality: Right;  . TEE WITHOUT CARDIOVERSION N/A 12/07/2018   Procedure: TRANSESOPHAGEAL ECHOCARDIOGRAM (TEE);  Surgeon: Dorothy Spark, MD;  Location: [facility] ENDOSCOPY;  Service: Cardiovascular;  Laterality: N/A;       Family History  Problem Relation Age of Onset  . Diabetes Sister   . Mental illness Sister   . Heart  disease Sister   . Arthritis Other   . Hypertension Other   . Alcohol abuse Other   . Heart attack Father   . Stroke Father   . Heart disease Other   . Arthritis Other   . Heart disease Other   . Stroke Other   . Mental illness Other   . Kidney disease Other   . Breast cancer Other        aunts  . Colon cancer Neg Hx   . Colon polyps Neg Hx   . Esophageal cancer Neg Hx   . Rectal cancer Neg Hx   . Stomach cancer Neg Hx     Social History   Tobacco Use  . Smoking status: Current Every Day Smoker    Packs/day: 0.25    Years: 42.00    Pack years: 10.50    Types: Cigarettes  . Smokeless tobacco: Never Used  . Tobacco  comment: 3 cigarettes per day( 07/08/20)  Vaping Use  . Vaping Use: Never used  Substance Use Topics  . Alcohol use: Never    Alcohol/week: 0.0 standard drinks  . Drug use: No    Home Medications Prior to Admission medications   Medication Sig Start Date End Date Taking? Authorizing Provider  amoxicillin (AMOXIL) 500 MG capsule Take 4 capsules by mouth 30-60 minutes prior to dental work 11/12/20   Jettie Booze, MD  apixaban (ELIQUIS) 5 MG TABS tablet Take 1 tablet (5 mg total) by mouth 2 (two) times daily. 11/08/20   Jettie Booze, MD  aspirin-acetaminophen-caffeine [facility] MIGRAINE) [phone] MG tablet Take 1 tablet by mouth every 6 (six) hours as needed for headache.    [provider]  diltiazem (CARDIZEM CD) 300 MG 24 hr capsule TAKE 1 CAPSULE BY MOUTH EVERY DAY Patient taking differently: Take 300 mg by mouth daily. 04/15/20   Jettie Booze, MD  lisinopril (ZESTRIL) 5 MG tablet TAKE 1 TABLET BY MOUTH EVERY DAY Patient taking differently: Take 5 mg by mouth daily. 04/15/20   Jettie Booze, MD    Allergies    Tensilon [edrophonium]  Review of Systems   Review of Systems  All other systems reviewed and are negative.   Physical Exam Updated Vital Signs BP (!) 108/49   Pulse 71   Temp (!) 97.5 F (36.4 C) (Oral)   Resp 17   SpO2 100%   Physical Exam Vitals and nursing note reviewed.  Constitutional:      General: He is not in acute distress.    Appearance: Normal appearance. He is well-developed. He is not toxic-appearing.  HENT:     Head: Normocephalic and atraumatic.  Eyes:     General: Lids are normal.     Conjunctiva/sclera: Conjunctivae normal.     Pupils: Pupils are equal, round, and reactive to light.  Neck:     Thyroid: No thyroid mass.     Trachea: No tracheal deviation.  Cardiovascular:     Rate and Rhythm: Normal rate and regular rhythm.     Heart sounds: Normal heart sounds. No murmur heard. No gallop.   Pulmonary:      Effort: Pulmonary effort is normal. No respiratory distress.     Breath sounds: Normal breath sounds. No stridor. No decreased breath sounds, wheezing, rhonchi or rales.  Abdominal:     General: Bowel sounds are normal. There is no distension.     Palpations: Abdomen is soft.     Tenderness: There is no abdominal tenderness.  There is no rebound.  Musculoskeletal:        General: No tenderness. Normal range of motion.     Cervical back: Normal range of motion and neck supple.  Skin:    General: Skin is warm and dry.     Findings: No abrasion or rash.  Neurological:     Mental Status: He is alert and oriented to person, place, and time.     GCS: GCS eye subscore is 4. GCS verbal subscore is 5. GCS motor subscore is 6.     Cranial Nerves: No cranial nerve deficit.     Sensory: No sensory deficit.  Psychiatric:        Speech: Speech normal.        Behavior: Behavior normal.     ED Results / Procedures / Treatments   Labs (all labs ordered are listed, but only abnormal results are displayed) Labs Reviewed  RESP PANEL BY RT-PCR (FLU A&B, COVID) [id number]  CBC WITH DIFFERENTIAL/PLATELET  COMPREHENSIVE METABOLIC PANEL  TROPONIN I (HIGH SENSITIVITY)    EKG EKG Interpretation  Date/Time:  Saturday Dec 14 2020 12:10:40 EDT Ventricular Rate:  63 PR Interval:  184 QRS Duration: 106 QT Interval:  434 QTC Calculation: 445 R Axis:   -53 Text Interpretation: Sinus rhythm Atrial premature complex LAD, consider left anterior fascicular block Left ventricular hypertrophy Borderline T abnormalities, inferior leads Baseline wander in lead(s) V6 Confirmed by Lacretia Leigh ([id number]) on 12/14/2020 1:17:27 PM   Radiology No results found.  Procedures Procedures   Medications Ordered in ED Medications - No data to display  ED Course  I have reviewed the triage vital signs and the nursing notes.  Pertinent labs & imaging results that were available during my care of the patient were  reviewed by me and considered in my medical decision making (see chart for details).    MDM Rules/Calculators/A&P                          Patient here with severe anemia of 3.9.  This was repeated.  He denies any black or bloody stools.  Has brown stool noted on exam by nursing.  2 units of packed red cells ordered.  He is also COVID-positive.  Plan will be to admit to the hospital Final Clinical Impression(s) / ED Diagnoses Final diagnoses:  None    Rx / DC Orders ED Discharge Orders    None       Lacretia Leigh, MD 12/14/20 1849

## 2020-12-14 NOTE — ED Notes (Signed)
Patient is taking soda well and eating apple sauce.  Patient reports feeling slightly dizzy and weak when standing, obtaining orthostatics VS

## 2020-12-14 NOTE — ED Notes (Signed)
Repeat CBC sent to lab

## 2020-12-14 NOTE — ED Notes (Signed)
Chest xray done.

## 2020-12-14 NOTE — ED Notes (Signed)
Type and Screen lab sent.

## 2020-12-14 NOTE — ED Notes (Signed)
Patient is on his telephone and reports that he is feeling somewhat better

## 2020-12-14 NOTE — ED Triage Notes (Signed)
Pt states that he has felt unwell since yesterday and passed out today. States family members in the household have Covid. Denies pain or head trauma from fall. C/O lightheaded and indigestion. EKG unremarkable from EMS. Alert and oriented.

## 2020-12-15 LAB — CBC
HCT: 17.6 % — ABNORMAL LOW (ref 39.0–52.0)
Hemoglobin: 5.4 g/dL — CL (ref 13.0–17.0)
MCH: 26 pg (ref 26.0–34.0)
MCHC: 30.7 g/dL (ref 30.0–36.0)
MCV: 84.6 fL (ref 80.0–100.0)
Platelets: 280 10*3/uL (ref 150–400)
RBC: 2.08 MIL/uL — ABNORMAL LOW (ref 4.22–5.81)
RDW: 17.2 % — ABNORMAL HIGH (ref 11.5–15.5)
WBC: 4.2 10*3/uL (ref 4.0–10.5)
nRBC: 0.5 % — ABNORMAL HIGH (ref 0.0–0.2)

## 2020-12-15 LAB — BASIC METABOLIC PANEL
Anion gap: 5 (ref 5–15)
BUN: 21 mg/dL — ABNORMAL HIGH (ref 6–20)
CO2: 21 mmol/L — ABNORMAL LOW (ref 22–32)
Calcium: 8 mg/dL — ABNORMAL LOW (ref 8.9–10.3)
Chloride: 113 mmol/L — ABNORMAL HIGH (ref 98–111)
Creatinine, Ser: 1.01 mg/dL (ref 0.61–1.24)
GFR, Estimated: >60 mL/min (ref >60)
Glucose, Bld: 122 mg/dL — ABNORMAL HIGH (ref 70–99)
Potassium: 3.6 mmol/L (ref 3.5–5.1)
Sodium: 139 mmol/L (ref 135–145)

## 2020-12-15 LAB — HEMOGLOBIN AND HEMATOCRIT, BLOOD
HCT: 20.3 % — ABNORMAL LOW (ref 39.0–52.0)
Hemoglobin: 6.2 g/dL — CL (ref 13.0–17.0)

## 2020-12-15 LAB — PREPARE RBC (CROSSMATCH)

## 2020-12-15 LAB — HIV ANTIBODY (ROUTINE TESTING W REFLEX): HIV Screen 4th Generation wRfx: NONREACTIVE

## 2020-12-15 MED ORDER — SODIUM CHLORIDE 0.9% IV SOLUTION: Freq: Once | INTRAVENOUS | Status: AC

## 2020-12-15 MED ORDER — POTASSIUM CHLORIDE CRYS ER 20 MEQ PO TBCR
40.0000 meq | EXTENDED_RELEASE_TABLET | Freq: Once | ORAL | Status: DC
  Filled 2020-12-15: qty 2

## 2020-12-15 NOTE — Plan of Care (Signed)
  Problem: Education: Goal: Knowledge of General Education information will improve Description: Including pain rating scale, medication(s)/side effects and non-pharmacologic comfort measures Outcome: Progressing   Problem: Clinical Measurements: Goal: Respiratory complications will improve Outcome: Progressing   Problem: Activity: Goal: Risk for activity intolerance will decrease Outcome: Progressing   Problem: Pain Managment: Goal: General experience of comfort will improve Outcome: Progressing   Problem: Safety: Goal: Ability to remain free from injury will improve Outcome: Progressing   

## 2020-12-15 NOTE — Progress Notes (Signed)
PROGRESS NOTE    Terry Golden.  JHE:[id number] DOB: [birth year] DOA: 12/14/2020 PCP: [name], NP    No chief complaint on file.   Brief Narrative:  H/o rheumatic fever,congenital heart disease ,status post atrioventricular septal defect repair and mitral valve repair at [facility] in 2000 H/o atrial flutter on eliquis, h/o  CHF, EF was 40%, normalized on repeat echo, presented with symptomatic anemia  Subjective:  No overt bleeding, vital signs are stable, he denies pain Hemoglobin improved to to 6 after two  Units prbc, will give another 2 units GI consulted  Assessment & Plan:   Principal Problem:   Symptomatic anemia Active Problems:   ASD (atrial septal defect)   VSD (ventricular septal defect)   Mitral valve disease   Syncope   Congenital heart disease   Atypical atrial flutter (HCC)   Chronic diastolic CHF (congestive heart failure) (HCC)   Acute symptomatic anemia/iron deficiency anemia -FOBT negative  - hemoglobin 3.9 on presentation -Received 2 unit PRBC transfusion on admission , will order another 2 units PRBC transfusion -GI consulted, plan for EGD tomorrow  COVID-19 positive He does not appear to have symptom Chest x-ray no acute findings Monitor  NSVT? x1 on tele, dose not appear symptomatic Correct anemia Give k supplement ( k3.6), check mag Continue cardizem  Continue tele  H/o rheumatic fever,congenital heart disease ,status post atrioventricular septal defect repair and mitral valve repair at [facility] in 2000 H/o atrial flutter on eliquis, h/o  CHF, EF was 40%, normalized on repeat echo HTN He has no chest pain, euvolemic on exam Eliquis and lisinopril on hold since admission, Currently on Cardizem Resume Eliquis when okay with GI Resume lisinopril when blood pressure improves    Body mass index is 21.06 kg/m.Marland Kitchen    Unresulted Labs (From admission, onward)          Start     Ordered   12/16/20 0500  C-reactive protein  Tomorrow  morning,   R       Question:  Specimen collection method  Answer:  Lab=Lab collect   12/15/20 1203   12/14/20 1319  CBC with Differential/Platelet  Once,   STAT        12/14/20 1319            DVT prophylaxis: SCDs Start: 12/14/20 1908   Code Status:full Family Communication: patient Disposition:   Status is: Inpatient  Dispo: The patient is from: Home              Anticipated d/c is to: Home              Anticipated d/c date is: Likely on Monday if cleared by GI                Consultants:   GI  Procedures:   EGD planned on May/16  Antimicrobials:   Anti-infectives (From admission, onward)   None          Objective: Vitals:   12/15/20 0500 12/15/20 0807 12/15/20 1307 12/15/20 1439  BP:  119/74 126/67 119/60  Pulse:   65 62  Resp:  18 20 20   Temp:  99 F (37.2 C) 98.9 F (37.2 C) 98.8 F (37.1 C)  TempSrc:  Oral Oral Oral  SpO2:  100% 100% 100%  Weight: 64.7 kg       Intake/Output Summary (Last 24 hours) at 12/15/2020 1458 Last data filed at 12/15/2020 0730 Gross per 24 hour  Intake 3488.05 ml  Output 1450 ml  Net 2038.05 ml   Filed Weights   12/15/20 0500  Weight: 64.7 kg    Examination:  General exam: calm, NAD Respiratory system: Clear to auscultation. Respiratory effort normal. Cardiovascular system: S1 & S2 heard, RRR.  No pedal edema. Gastrointestinal system: Abdomen is nondistended, soft and nontender. Normal bowel sounds heard. Central nervous system: Alert and oriented. No focal neurological deficits. Extremities: Symmetric 5 x 5 power. Skin: No rashes, lesions or ulcers Psychiatry: Judgement and insight appear normal. Mood & affect appropriate.     Data Reviewed: I have personally reviewed following labs and imaging studies  CBC: Recent Labs  Lab 12/14/20 1555 12/14/20 1746 12/15/20 0309 12/15/20 0636  WBC 4.5 5.1 4.2  --   NEUTROABS 3.4  --   --   --   HGB 3.9* 3.9* 5.4* 6.2*  HCT 13.8* 13.8* 17.6* 20.3*  MCV  84.1 82.6 84.6  --   PLT 267 298 280  --     Basic Metabolic Panel: Recent Labs  Lab 12/14/20 1540 12/15/20 0309  NA 136 139  K 4.9 3.6  CL 110 113*  CO2 21* 21*  GLUCOSE 111* 122*  BUN 37* 21*  CREATININE 1.08 1.01  CALCIUM 8.5* 8.0*    GFR: Estimated Creatinine Clearance: 75.6 mL/min (by C-G formula based on SCr of 1.01 mg/dL).  Liver Function Tests: Recent Labs  Lab 12/14/20 1540  AST 25  ALT 10  ALKPHOS 38  BILITOT 0.3  PROT 5.8*  ALBUMIN 3.2*    CBG: No results for input(s): GLUCAP in the last 168 hours.   Recent Results (from the past 240 hour(s))  Resp Panel by RT-PCR (Flu A&B, Covid) Nasopharyngeal Swab     Status: Abnormal   Collection Time: 12/14/20  1:33 PM   Specimen: Nasopharyngeal Swab; Nasopharyngeal(NP) swabs in vial transport medium  Result Value Ref Range Status   SARS Coronavirus 2 by RT PCR POSITIVE (A) NEGATIVE Final    Comment: RESULT CALLED TO, READ BACK BY AND VERIFIED WITH: MASON,C. RN AT 2595 12/14/20 MULLINS,T  (NOTE) SARS-CoV-2 target nucleic acids are DETECTED.  The SARS-CoV-2 RNA is generally detectable in upper respiratory specimens during the acute phase of infection. Positive results are indicative of the presence of the identified virus, but do not rule out bacterial infection or co-infection with other pathogens not detected by the test. Clinical correlation with patient history and other diagnostic information is necessary to determine patient infection status. The expected result is Negative.  Fact Sheet for Patients: EntrepreneurPulse.com.au  Fact Sheet for Healthcare Providers: IncredibleEmployment.be  This test is not yet approved or cleared by the [place] FDA and  has been authorized for detection and/or diagnosis of SARS-CoV-2 by FDA under an Emergency Use Authorization (EUA).  This EUA will remain in effect (meaning this test  can be used) for the duration of  the  COVID-19 declaration under Section 564(b)(1) of the Act, 21 U.S.C. section 360bbb-3(b)(1), unless the authorization is terminated or revoked sooner.     Influenza A by PCR NEGATIVE NEGATIVE Final   Influenza B by PCR NEGATIVE NEGATIVE Final    Comment: (NOTE) The Xpert Xpress SARS-CoV-2/FLU/RSV plus assay is intended as an aid in the diagnosis of influenza from Nasopharyngeal swab specimens and should not be used as a sole basis for treatment. Nasal washings and aspirates are unacceptable for Xpert Xpress SARS-CoV-2/FLU/RSV testing.  Fact Sheet for Patients: EntrepreneurPulse.com.au  Fact Sheet for Healthcare Providers: IncredibleEmployment.be  This test  is not yet approved or cleared by the [place] and has been authorized for detection and/or diagnosis of SARS-CoV-2 by FDA under an Emergency Use Authorization (EUA). This EUA will remain in effect (meaning this test can be used) for the duration of the COVID-19 declaration under Section 564(b)(1) of the Act, 21 U.S.C. section 360bbb-3(b)(1), unless the authorization is terminated or revoked.  Performed at [facility], [place] [address], [place], [place] [address]          Radiology Studies: [facility] Chest Port 1 View  Result Date: 12/14/2020 CLINICAL DATA:  Cough EXAM: PORTABLE CHEST 1 VIEW COMPARISON:  02/12/2020 FINDINGS: Cardiomegaly status post median sternotomy. Both lungs are clear. The visualized skeletal structures are unremarkable. IMPRESSION: Cardiomegaly without acute abnormality of the lungs in AP portable projection. Electronically Signed   By: Eddie Candle M.D.   On: 12/14/2020 14:17        Scheduled Meds: . diltiazem  300 mg Oral Daily  . sodium chloride flush  3 mL Intravenous Q12H   Continuous Infusions: . sodium chloride Stopped (12/14/20 2002)     LOS: 0 days   Time spent: 1mins Greater than 50% of this time was spent in counseling,  explanation of diagnosis, planning of further management, and coordination of care.   Voice Recognition Viviann Spare dictation system was used to create this note, attempts have been made to correct errors. Please contact the author with questions and/or clarifications.   Florencia Reasons, MD PhD FACP [facility]  Available via Epic secure chat 7am-7pm for nonurgent issues Please page for urgent issues To page the attending provider between 7A-7P or the covering provider during after hours 7P-7A, please log into the web site [email or web address] and access using universal  password for that web site. If you do not have the password, please call the hospital operator.    12/15/2020, 2:58 PM

## 2020-12-15 NOTE — Progress Notes (Signed)
   12/15/20 0431  Provider Notification  Provider Name/Title Gean Birchwood MD  Date Provider Notified 12/15/20  Time Provider Notified 0430  Notification Type Page  Notification Reason Critical result  Test performed and critical result Hgb 5.4  Date Critical Result Received 12/15/20  Time Critical Result Received 0420  Provider response Other (Comment) (2 U pRBC transfusion complete. will repeat H&H at 0600)  Date of Provider Response 12/15/20  Time of Provider Response [phone]

## 2020-12-15 NOTE — Consult Note (Signed)
Consult Note for [facility]  Reason for Consult: Severe IDA Referring Physician: [facility]  Laurance Flatten. HPI: This is a 56 year old male with a PMH of CHF, VSD, AS, atrial fibrillation and flutter, and s/p MVR admitted for syncope and progressive weakness.  In the ER he was noted to have an HGB of 3.9 g/dL and his baseline is in the 13 range.  The last normal values was on 06/19/2020 and the HGB was at 13.7 g/dL.  A hemoccult was performed and he was heme negative.  The patient denies any issues with hematochezia or melena while taking Eliquis.  A recent dental extraction was performed and he does not report any excessive bleeding from the procedure.  He was prescribed Percocet and ibuprofen 800 mg TID.  The patient reports that he only took the ibuprofen for one week.  Before his dental procedure he was using Excederin and ASA.  His screening colonoscopy with Dr. Rhea Belton was on 07/08/2020 and it was a normal examination with an excellent prep.  Over the past several weeks he reports episodes of dizziness and weakness, which markedly worsened two days prior to admission.  The patient was not hypotensive with his presentation.  The patient does show a severe iron deficiency with an iron saturation of 4% and a ferritin of 2.  The blood work does not display a microcytosis and there was no evidence of hemolysis.  Past Medical History:  Diagnosis Date   ASD (atrial septal defect)    large    Congenital heart disease    ASVD, Cleft Mitral Valve   Dysrhythmia    aflutter   Echocardiogram    Echo 01/2019: EF 45-50, mod basal septal hypertrophy, Gr 2 DD, s/p VSD closure with no residual shunt,s/p MV ring with mod MR, s/p ASD closure with no residual shunt   Heart murmur    from Rheumatic Fever   Hypertension    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   right   Mitral valve disease 03/25/1999   s/p mitral valve repair with commissuroplasty    Rheumatic fever    Syncope    1999   Typical atrial  flutter (HCC)    VSD (ventricular septal defect)    small    Past Surgical History:  Procedure Laterality Date   CARDIAC CATHETERIZATION N/A 09/20/2015   Procedure: Right/Left Heart Cath and Coronary Angiography;  Surgeon: Corky Crafts, MD;  Location: [facility] INVASIVE CV LAB;  Service: Cardiovascular;  Laterality: N/A;   CARDIAC SURGERY     s/p partial AVSD repair and cleft mitral valve repair with commissuroplasty   CARDIAC VALVE REPLACEMENT     CARDIOVERSION N/A 12/07/2018   Procedure: CARDIOVERSION;  Surgeon: Lars Masson, MD;  Location: [facility] ENDOSCOPY;  Service: Cardiovascular;  Laterality: N/A;   COLONOSCOPY  07/08/2020   LIPOMA EXCISION Right 06/19/2020   Procedure: EXCISION SUBCUTANEOUS LIPOMA RIGHT SHOULDER;  Surgeon: Manus Rudd, MD;  Location: [facility] OR;  Service: General;  Laterality: Right;   TEE WITHOUT CARDIOVERSION N/A 12/07/2018   Procedure: TRANSESOPHAGEAL ECHOCARDIOGRAM (TEE);  Surgeon: Lars Masson, MD;  Location: [facility] ENDOSCOPY;  Service: Cardiovascular;  Laterality: N/A;    Family History  Problem Relation Age of Onset   Diabetes Sister    Mental illness Sister    Heart disease Sister    Arthritis Other    Hypertension Other    Alcohol abuse Other    Heart attack Father    Stroke Father  Heart disease Other    Arthritis Other    Heart disease Other    Stroke Other    Mental illness Other    Kidney disease Other    Breast cancer Other        aunts   Colon cancer Neg Hx    Colon polyps Neg Hx    Esophageal cancer Neg Hx    Rectal cancer Neg Hx    Stomach cancer Neg Hx     Social History:  reports that he has been smoking cigarettes. He has a 10.50 pack-year smoking history. He has never used smokeless tobacco. He reports that he does not drink alcohol and does not use drugs.  Allergies:  Allergies  Allergen Reactions   Tensilon [Edrophonium] Other (See Comments)    Blacked out     Medications:  Scheduled:  diltiazem  300 mg Oral Daily    sodium chloride flush  3 mL Intravenous Q12H   Continuous:  sodium chloride Stopped (12/14/20 2002)    Results for orders placed or performed during the hospital encounter of 12/14/20 (from the past 24 hour(s))  Comprehensive metabolic panel     Status: Abnormal   Collection Time: 12/14/20  3:40 PM  Result Value Ref Range   Sodium 136 135 - 145 mmol/L   Potassium 4.9 3.5 - 5.1 mmol/L   Chloride 110 98 - 111 mmol/L   CO2 21 (L) 22 - 32 mmol/L   Glucose, Bld 111 (H) 70 - 99 mg/dL   BUN 37 (H) 6 - 20 mg/dL   Creatinine, Ser 1.08 0.61 - 1.24 mg/dL   Calcium 8.5 (L) 8.9 - 10.3 mg/dL   Total Protein 5.8 (L) 6.5 - 8.1 g/dL   Albumin 3.2 (L) 3.5 - 5.0 g/dL   AST 25 15 - 41 U/L   ALT 10 0 - 44 U/L   Alkaline Phosphatase 38 38 - 126 U/L   Total Bilirubin 0.3 0.3 - 1.2 mg/dL   GFR, Estimated >60 >60 mL/min   Anion gap 5 5 - 15  Troponin I (High Sensitivity)     Status: None   Collection Time: 12/14/20  3:40 PM  Result Value Ref Range   Troponin I (High Sensitivity) 4 <18 ng/L  CBC     Status: Abnormal   Collection Time: 12/14/20  3:55 PM  Result Value Ref Range   WBC 4.5 4.0 - 10.5 K/uL   RBC 1.64 (L) 4.22 - 5.81 MIL/uL   Hemoglobin 3.9 (LL) 13.0 - 17.0 g/dL   HCT 13.8 (L) 39.0 - 52.0 %   MCV 84.1 80.0 - 100.0 fL   MCH 23.8 (L) 26.0 - 34.0 pg   MCHC 28.3 (L) 30.0 - 36.0 g/dL   RDW 17.1 (H) 11.5 - 15.5 %   Platelets 267 150 - 400 K/uL   nRBC 0.0 0.0 - 0.2 %  Differential     Status: None   Collection Time: 12/14/20  3:55 PM  Result Value Ref Range   Neutrophils Relative % 75 %   Neutro Abs 3.4 1.7 - 7.7 K/uL   Lymphocytes Relative 19 %   Lymphs Abs 0.9 0.7 - 4.0 K/uL   Monocytes Relative 5 %   Monocytes Absolute 0.2 0.1 - 1.0 K/uL   Eosinophils Relative 1 %   Eosinophils Absolute 0.0 0.0 - 0.5 K/uL   Basophils Relative 0 %   Basophils Absolute 0.0 0.0 - 0.1 K/uL   Immature Granulocytes 0 %   Abs Immature  Granulocytes 0.01 0.00 - 0.07 K/uL  CBC     Status: Abnormal    Collection Time: 12/14/20  5:46 PM  Result Value Ref Range   WBC 5.1 4.0 - 10.5 K/uL   RBC 1.67 (L) 4.22 - 5.81 MIL/uL   Hemoglobin 3.9 (LL) 13.0 - 17.0 g/dL   HCT 13.8 (L) 39.0 - 52.0 %   MCV 82.6 80.0 - 100.0 fL   MCH 23.4 (L) 26.0 - 34.0 pg   MCHC 28.3 (L) 30.0 - 36.0 g/dL   RDW 17.1 (H) 11.5 - 15.5 %   Platelets 298 150 - 400 K/uL   nRBC 0.0 0.0 - 0.2 %  Type and screen [place]     Status: None (Preliminary result)   Collection Time: 12/14/20  6:15 PM  Result Value Ref Range   ABO/RH(D) O POS    Antibody Screen NEG    Sample Expiration 12/17/2020,2359    Unit Number [id number]    Blood Component Type RED CELLS,LR    Unit division 00    Status of Unit OUTDATED/DESTROYED    Transfusion Status OK TO TRANSFUSE    Crossmatch Result Compatible    Unit Number [id number]    Blood Component Type RED CELLS,LR    Unit division 00    Status of Unit ISSUED    Transfusion Status OK TO TRANSFUSE    Crossmatch Result Compatible    Unit Number [id number]    Blood Component Type RED CELLS,LR    Unit division 00    Status of Unit ISSUED    Transfusion Status OK TO TRANSFUSE    Crossmatch Result Compatible    Unit Number [id number]    Blood Component Type RBC LR PHER2    Unit division 00    Status of Unit ALLOCATED    Transfusion Status OK TO TRANSFUSE    Crossmatch Result Compatible    Unit Number [id number]    Blood Component Type RED CELLS,LR    Unit division 00    Status of Unit ISSUED    Transfusion Status OK TO TRANSFUSE    Crossmatch Result      Compatible Performed at [facility], [place] [address] [place], [place] [address]   Prepare RBC (crossmatch)     Status: None   Collection Time: 12/14/20  6:24 PM  Result Value Ref Range   Order Confirmation      ORDER PROCESSED BY BLOOD BANK Performed at [facility], [place] [address] [place], [place] [address]   ABO/Rh     Status: None   Collection  Time: 12/14/20  6:35 PM  Result Value Ref Range   ABO/RH(D)      O POS Performed at [facility], [place] [address], [place], [place] [address]   POC occult blood, ED RN will collect     Status: None   Collection Time: 12/14/20  6:51 PM  Result Value Ref Range   Fecal Occult Bld NEGATIVE NEGATIVE  Troponin I (High Sensitivity)     Status: None   Collection Time: 12/14/20  8:20 PM  Result Value Ref Range   Troponin I (High Sensitivity) 5 <18 ng/L  HIV Antibody (routine testing w rflx)     Status: None   Collection Time: 12/14/20  8:20 PM  Result Value Ref Range   HIV Screen 4th Generation wRfx Non Reactive Non Reactive  Iron and TIBC     Status: Abnormal   Collection Time:  12/14/20  8:20 PM  Result Value Ref Range   Iron 13 (L) 45 - 182 ug/dL   TIBC 352 250 - 450 ug/dL   Saturation Ratios 4 (L) 17.9 - 39.5 %   UIBC 339 ug/dL  Ferritin     Status: Abnormal   Collection Time: 12/14/20  8:20 PM  Result Value Ref Range   Ferritin 2 (L) 24 - 336 ng/mL  Reticulocytes     Status: Abnormal   Collection Time: 12/14/20  8:20 PM  Result Value Ref Range   Retic Ct Pct 2.0 0.4 - 3.1 %   RBC. 1.54 (L) 4.22 - 5.81 MIL/uL   Retic Count, Absolute 31.4 19.0 - 186.0 K/uL   Immature Retic Fract 40.6 (H) 2.3 - 15.9 %  CBC     Status: Abnormal   Collection Time: 12/15/20  3:09 AM  Result Value Ref Range   WBC 4.2 4.0 - 10.5 K/uL   RBC 2.08 (L) 4.22 - 5.81 MIL/uL   Hemoglobin 5.4 (LL) 13.0 - 17.0 g/dL   HCT 17.6 (L) 39.0 - 52.0 %   MCV 84.6 80.0 - 100.0 fL   MCH 26.0 26.0 - 34.0 pg   MCHC 30.7 30.0 - 36.0 g/dL   RDW 17.2 (H) 11.5 - 15.5 %   Platelets 280 150 - 400 K/uL   nRBC 0.5 (H) 0.0 - 0.2 %  Basic metabolic panel     Status: Abnormal   Collection Time: 12/15/20  3:09 AM  Result Value Ref Range   Sodium 139 135 - 145 mmol/L   Potassium 3.6 3.5 - 5.1 mmol/L   Chloride 113 (H) 98 - 111 mmol/L   CO2 21 (L) 22 - 32 mmol/L   Glucose, Bld 122 (H) 70 - 99 mg/dL   BUN 21  (H) 6 - 20 mg/dL   Creatinine, Ser 1.01 0.61 - 1.24 mg/dL   Calcium 8.0 (L) 8.9 - 10.3 mg/dL   GFR, Estimated >60 >60 mL/min   Anion gap 5 5 - 15  Hemoglobin and hematocrit, blood     Status: Abnormal   Collection Time: 12/15/20  6:36 AM  Result Value Ref Range   Hemoglobin 6.2 (LL) 13.0 - 17.0 g/dL   HCT 20.3 (L) 39.0 - 52.0 %  Prepare RBC (crossmatch)     Status: None   Collection Time: 12/15/20  1:34 PM  Result Value Ref Range   Order Confirmation      ORDER PROCESSED BY BLOOD BANK Performed at [facility], [place] [address], [place], [place] [address]      DG Chest Port 1 View  Result Date: 12/14/2020 CLINICAL DATA:  Cough EXAM: PORTABLE CHEST 1 VIEW COMPARISON:  02/12/2020 FINDINGS: Cardiomegaly status post median sternotomy. Both lungs are clear. The visualized skeletal structures are unremarkable. IMPRESSION: Cardiomegaly without acute abnormality of the lungs in AP portable projection. Electronically Signed   By: Eddie Candle M.D.   On: 12/14/2020 14:17    ROS:  As stated above in the HPI otherwise negative.  Blood pressure 119/60, pulse 62, temperature 98.8 F (37.1 C), temperature source Oral, resp. rate 20, weight 64.7 kg, SpO2 100 %.    PE: Gen: NAD, Alert and Oriented HEENT:  [place]/AT, EOMI Neck: Supple, no LAD Lungs: CTA Bilaterally CV: RRR without M/G/R ABD: Soft, NTND, +BS Ext: No C/C/E  Assessment/Plan: 1) Severe IDA. 2) Atrial fibrillation. 3) Fatigue.   The patient does not display any overt signs of bleeding.  His colonoscopy  in December was normal and there was no evidence of hemolysis or any other obvious hematologic disorder.  It is reasonable to perform further evaluation with an EGD.  It may be that he has NSDAID-induced ulceration with his use of Excederin and ASA for some weeks before his dental extraction.  Plan: 1) EGD tomorrow with Dr. Tarri Glenn.  Yochanan Eddleman D 12/15/2020, 3:25 PM

## 2020-12-16 ENCOUNTER — Inpatient Hospital Stay (HOSPITAL_COMMUNITY): Payer: Self-pay | Admitting: Anesthesiology

## 2020-12-16 ENCOUNTER — Encounter (HOSPITAL_COMMUNITY)
Admission: EM | Disposition: A | Discharge status: Home / Self Care | Payer: Self-pay | Source: Home / Self Care | Attending: Internal Medicine

## 2020-12-16 ENCOUNTER — Encounter (HOSPITAL_COMMUNITY): Payer: Self-pay | Admitting: Internal Medicine

## 2020-12-16 DIAGNOSIS — D509 Iron deficiency anemia, unspecified: Secondary | ICD-10-CM

## 2020-12-16 DIAGNOSIS — D649 Anemia, unspecified: Secondary | ICD-10-CM

## 2020-12-16 DIAGNOSIS — K2970 Gastritis, unspecified, without bleeding: Principal | ICD-10-CM

## 2020-12-16 DIAGNOSIS — K2990 Gastroduodenitis, unspecified, without bleeding: Secondary | ICD-10-CM

## 2020-12-16 HISTORY — PX: ESOPHAGOGASTRODUODENOSCOPY (EGD) WITH PROPOFOL: SHX5813

## 2020-12-16 HISTORY — PX: BIOPSY: SHX5522

## 2020-12-16 LAB — BPAM RBC
Blood Product Expiration Date: 202206082359
Blood Product Expiration Date: 202206082359
Blood Product Expiration Date: 202206082359
Blood Product Expiration Date: 202206112359
Blood Product Expiration Date: 202206112359
ISSUE DATE / TIME: 202205142014
ISSUE DATE / TIME: 202205142327
ISSUE DATE / TIME: 202205150107
ISSUE DATE / TIME: 202205151446
ISSUE DATE / TIME: 202205151803
Unit Type and Rh: 5100
Unit Type and Rh: 5100
Unit Type and Rh: 5100
Unit Type and Rh: 5100
Unit Type and Rh: 5100

## 2020-12-16 LAB — BASIC METABOLIC PANEL
Anion gap: 1 — ABNORMAL LOW (ref 5–15)
BUN: 15 mg/dL (ref 6–20)
CO2: 26 mmol/L (ref 22–32)
Calcium: 8.1 mg/dL — ABNORMAL LOW (ref 8.9–10.3)
Chloride: 111 mmol/L (ref 98–111)
Creatinine, Ser: 1.11 mg/dL (ref 0.61–1.24)
GFR, Estimated: >60 mL/min (ref >60)
Glucose, Bld: 99 mg/dL (ref 70–99)
Potassium: 4 mmol/L (ref 3.5–5.1)
Sodium: 138 mmol/L (ref 135–145)

## 2020-12-16 LAB — MAGNESIUM: Magnesium: 2.1 mg/dL (ref 1.7–2.4)

## 2020-12-16 LAB — CBC
HCT: 25.6 % — ABNORMAL LOW (ref 39.0–52.0)
Hemoglobin: 8.1 g/dL — ABNORMAL LOW (ref 13.0–17.0)
MCH: 26.8 pg (ref 26.0–34.0)
MCHC: 31.6 g/dL (ref 30.0–36.0)
MCV: 84.8 fL (ref 80.0–100.0)
Platelets: 273 10*3/uL (ref 150–400)
RBC: 3.02 MIL/uL — ABNORMAL LOW (ref 4.22–5.81)
RDW: 16.1 % — ABNORMAL HIGH (ref 11.5–15.5)
WBC: 5.7 10*3/uL (ref 4.0–10.5)
nRBC: 0 % (ref 0.0–0.2)

## 2020-12-16 LAB — TYPE AND SCREEN
ABO/RH(D): O POS
Antibody Screen: NEGATIVE
Unit division: 0
Unit division: 0
Unit division: 0
Unit division: 0
Unit division: 0

## 2020-12-16 LAB — C-REACTIVE PROTEIN: CRP: <0.5 mg/dL (ref <1.0)

## 2020-12-16 SURGERY — ESOPHAGOGASTRODUODENOSCOPY (EGD) WITH PROPOFOL
Anesthesia: Monitor Anesthesia Care

## 2020-12-16 MED ORDER — PROPOFOL 10 MG/ML IV BOLUS
INTRAVENOUS | Status: DC | PRN
  Administered 2020-12-16: 60 mg via INTRAVENOUS
  Administered 2020-12-16: 20 mg via INTRAVENOUS

## 2020-12-16 MED ORDER — FERROUS SULFATE 325 (65 FE) MG PO TABS: 325.0000 mg | ORAL_TABLET | Freq: Every day | ORAL | 1 refills | Status: DC

## 2020-12-16 MED ORDER — LACTATED RINGERS IV SOLN: INTRAVENOUS | Status: DC | PRN

## 2020-12-16 MED ORDER — PANTOPRAZOLE SODIUM 40 MG PO TBEC: 40.0000 mg | DELAYED_RELEASE_TABLET | Freq: Every day | ORAL | Status: DC

## 2020-12-16 MED ORDER — LIDOCAINE 2% (20 MG/ML) 5 ML SYRINGE
INTRAMUSCULAR | Status: DC | PRN
  Administered 2020-12-16: 60 mg via INTRAVENOUS

## 2020-12-16 MED ORDER — FERROUS SULFATE 325 (65 FE) MG PO TABS: 325.0000 mg | ORAL_TABLET | Freq: Every day | ORAL | Status: DC

## 2020-12-16 MED ORDER — PROPOFOL 500 MG/50ML IV EMUL
INTRAVENOUS | Status: DC | PRN
  Administered 2020-12-16: 120 ug/kg/min via INTRAVENOUS

## 2020-12-16 MED ORDER — APIXABAN 5 MG PO TABS: 5.0000 mg | ORAL_TABLET | Freq: Two times a day (BID) | ORAL | 1 refills | Status: DC

## 2020-12-16 MED ORDER — PANTOPRAZOLE SODIUM 40 MG PO TBEC: 40.0000 mg | DELAYED_RELEASE_TABLET | Freq: Every day | ORAL | 1 refills | Status: DC

## 2020-12-16 MED ORDER — LACTATED RINGERS IV SOLN: INTRAVENOUS | Status: DC

## 2020-12-16 SURGICAL SUPPLY — 15 items

## 2020-12-16 NOTE — Progress Notes (Signed)
Patient ID: Terry Golden., male   DOB: Dec 11, 1965, 55 y.o.   MRN: [id number]    Progress Note   Subjective   day # 2  CC; severe iron deficiency  Anemia,weakness in setting of Eliquis  HGB 8.1 this am- post transfusions Eliquis on hold Heme neg on admit  Colon outpt 07/2020 negative except small internal hemorrhoids  Patient is now COVID-positive-asymptomatic  Patient says he feels better, no complaints of abdominal pain, no melena or hematochezia.  He denies any symptoms related to COVID other than feeling weak and lightheaded last week ,   Objective   Vital signs in last 24 hours: Temp:  [98.5 F (36.9 C)-99.2 F (37.3 C)] 98.5 F (36.9 C) (05/16 0434) Pulse Rate:  [60-69] 64 (05/16 0434) Resp:  [18-21] 18 (05/16 0434) BP: (118-130)/(55-72) 125/64 (05/16 0434) SpO2:  [97 %-100 %] 97 % (05/16 0434) Weight:  [64.1 kg] 64.1 kg (05/16 0438) Last BM Date: 12/12/20 General:   AA male in NAD . Neurologic:  Alert and oriented,  grossly normal neurologically. Psych:  Cooperative. Normal mood and affect.  Intake/Output from previous day: 05/15 0701 - 05/16 0700 In: 962.5 [P.O.:240; I.V.:50; Blood:672.5] Out: 1700 [Urine:1700] Intake/Output this shift: No intake/output data recorded.  Lab Results: Recent Labs    12/14/20 1746 12/15/20 0309 12/15/20 0636 12/16/20 0157  WBC 5.1 4.2  --  5.7  HGB 3.9* 5.4* 6.2* 8.1*  HCT 13.8* 17.6* 20.3* 25.6*  PLT 298 280  --  273   BMET Recent Labs    12/14/20 1540 12/15/20 0309 12/16/20 0157  NA 136 139 138  K 4.9 3.6 4.0  CL 110 113* 111  CO2 21* 21* 26  GLUCOSE 111* 122* 99  BUN 37* 21* 15  CREATININE 1.08 1.01 1.11  CALCIUM 8.5* 8.0* 8.1*   LFT Recent Labs    12/14/20 1540  PROT 5.8*  ALBUMIN 3.2*  AST 25  ALT 10  ALKPHOS 38  BILITOT 0.3   PT/INR No results for input(s): LABPROT, INR in the last 72 hours.  Studies/Results: DG Chest Port 1 View  Result Date: 12/14/2020 CLINICAL DATA:  Cough EXAM:  PORTABLE CHEST 1 VIEW COMPARISON:  02/12/2020 FINDINGS: Cardiomegaly status post median sternotomy. Both lungs are clear. The visualized skeletal structures are unremarkable. IMPRESSION: Cardiomegaly without acute abnormality of the lungs in AP portable projection. Electronically Signed   By: Eddie Candle M.D.   On: 12/14/2020 14:17       Assessment / Plan:    #50 55 year old African-American male admitted with profound weakness lightheadedness and episode of syncope Hemoglobin 3.9 on admission, heme-negative, iron deficient  Very recent colonoscopy done for screening December 2021 negative  Positive recent NSAID use  Chronic anticoagulation-Eliquis  Etiology of profound anemia and iron deficiency not clear-rule out chronic gastropathy, rule out AVMs, rule out occult neoplasm  #2 Atrial septal defect/ventricular septal defect, status post mitral valve repair  #3 chronic anticoagulation-on Eliquis 4.  Atrial flutter  5.  Congestive heart failure EF 45-50 #6 COVID-positive-currently asymptomatic, patient has been vaccinated and booster x1  Plan; patient is scheduled for EGD with Dr. Carlean Purl this afternoon Continue to hold Eliquis Continue to trend hemoglobin and transfuse as indicated If EGD unrevealing will proceed with capsule endoscopy    Principal Problem:   Symptomatic anemia Active Problems:   ASD (atrial septal defect)   VSD (ventricular septal defect)   Mitral valve disease   Syncope   Congenital heart disease  Atypical atrial flutter (HCC)   Chronic diastolic CHF (congestive heart failure) ([place])     LOS: 1 day   Perl Folmar PA-C 12/16/2020, 8:31 AM

## 2020-12-16 NOTE — Op Note (Signed)
[facility] Patient Name: Terry Golden Procedure Date: 12/16/2020 MRN: [id number] Attending MD: Gatha Mayer , MD Date of Birth: 30-May-1966 CSN: [id number] Age: 55 Admit Type: Inpatient Procedure:                Upper GI endoscopy Indications:              Iron deficiency anemia Providers:                Gatha Mayer, MD, Mariana Arn, Elspeth Cho                            Tech., Technician Referring MD:              Medicines:                Propofol per Anesthesia, Monitored Anesthesia Care Complications:            No immediate complications. Estimated Blood Loss:     Estimated blood loss was minimal. Procedure:                Pre-Anesthesia Assessment:                           - Prior to the procedure, a History and Physical                            was performed, and patient medications and                            allergies were reviewed. The patient's tolerance of                            previous anesthesia was also reviewed. The risks                            and benefits of the procedure and the sedation                            options and risks were discussed with the patient.                            All questions were answered, and informed consent                            was obtained. Prior Anticoagulants: The patient                            last took Eliquis (apixaban) 1 day prior to the                            procedure. ASA Grade Assessment: III - A patient                            with severe systemic disease. After reviewing the  risks and benefits, the patient was deemed in                            satisfactory condition to undergo the procedure.                           After obtaining informed consent, the endoscope was                            passed under direct vision. Throughout the                            procedure, the patient's blood pressure, pulse, and                             oxygen saturations were monitored continuously. The                            GIF-H190 ([id number]) Olympus gastroscope was                            introduced through the mouth, and advanced to the                            second part of duodenum. The upper GI endoscopy was                            accomplished without difficulty. The patient                            tolerated the procedure well. Scope In: Scope Out: Findings:      Diffuse moderate inflammation characterized by congestion (edema),       erythema, friability and granularity was found in the gastric antrum.       Biopsies were taken with a cold forceps for histology. Verification of       patient identification for the specimen was done. Estimated blood loss       was minimal.      A 2 cm hiatal hernia was present.      The gastroesophageal flap valve was visualized endoscopically and       classified as Hill Grade IV (no fold, wide open lumen, hiatal hernia       present).      The exam was otherwise without abnormality.      The cardia and gastric fundus were normal on retroflexion.      Biopsies for histology were taken with a cold forceps in the second       portion of the duodenum for evaluation of celiac disease. Verification       of patient identification for the specimen was done. Estimated blood       loss was minimal. Impression:               - Gastritis. Biopsied.                           - 2 cm hiatal hernia.                           -  Gastroesophageal flap valve classified as Hill                            Grade IV (no fold, wide open lumen, hiatal hernia                            present).                           - The examination was otherwise normal.                           - Biopsies were taken with a cold forceps for                            evaluation of celiac disease. Moderate Sedation:      Not Applicable - Patient had care per Anesthesia. Recommendation:           -  Return patient to hospital ward for possible                            discharge same day.                           - Cardiac diet.                           - Resume Eliquis (apixaban) at prior dose tomorrow.                           - Await pathology results.                           - Use Protonix (pantoprazole) 40 mg PO daily.                           - Will contact opatient about path results and plans                           OK to dc today I think                           needs to take daily ferrous sulfate 325 mg                           If cannot afford pantoprazole would put on OTC                            omeprazole 20 mg qd or equivalent Procedure Code(s):        --- Professional ---                           [phone], Esophagogastroduodenoscopy, flexible,                            transoral; with biopsy, single  or multiple Diagnosis Code(s):        --- Professional ---                           K29.70, Gastritis, unspecified, without bleeding                           K44.9, Diaphragmatic hernia without obstruction or                            gangrene                           D50.9, Iron deficiency anemia, unspecified CPT copyright 2019 American Medical Association. All rights reserved. The codes documented in this report are preliminary and upon coder review may  be revised to meet current compliance requirements. Gatha Mayer, MD 12/16/2020 2:55:47 PM This report has been signed electronically. Number of Addenda: 0

## 2020-12-16 NOTE — Discharge Summary (Signed)
Physician Discharge Summary  Terry Golden. 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  On presentation his hemoglobin was in the range of 3.  He underwent 4 units of blood transfusion during this hospitalization.  No signs of overt bleeding.  Vitals were stable.  COVID test came out to be incidentally positive, no treatment indicated.  GI consulted and he underwent EGD today with finding of gastritis.  GI cleared him for discharge with oral Protonix and oral supplementation.  He is medically stable for discharge home today.  Following problems were addressed during his hospitalization:   Acute symptomatic anemia/iron deficiency anemia -FOBT negative  - hemoglobin 3.9 on presentation -Received total of 4 units PRBC transfusion, hemoglobin stable in the range of 8 today -GI consulted,  underwent EGD today with finding of gastritis, no active bleeding.  Biopsies were taken -Patient will be started on Protonix 40 mg daily and iron  COVID-19 positive He does not appear to have symptom Chest x-ray no acute findings No plan for treatment  NSVT? 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  Patient can restart Eliquis tomorrow Currently on Cardizem Follow-up with cardiology.     Discharge Diagnoses:  Principal Problem:   Symptomatic anemia Active Problems:   ASD (atrial septal defect)   VSD (ventricular septal defect)   Mitral valve disease   Syncope   Congenital heart disease   Atypical atrial flutter (HCC)   Chronic diastolic CHF (congestive heart failure) (HCC)   Absolute anemia   Gastritis and gastroduodenitis    Discharge Instructions  Discharge Instructions    Diet - low sodium heart healthy   Complete by: As directed    Discharge instructions   Complete by: As directed    1)Please take prescribed medications as instructed. 2)Follow up with your PCP in a week.  Do a CBC test to check your hemoglobin. 3)Follow up with gastroenterology as an outpatient.   Increase activity slowly   Complete by: As directed      Allergies as of 12/16/2020      Reactions   Tensilon [edrophonium] Other (See Comments)   Blacked out       Medication List    TAKE these medications   amoxicillin 500 MG capsule Commonly known as: AMOXIL Take 4 capsules by mouth 30-60 minutes prior to dental work   apixaban 5 MG Tabs tablet Commonly known as: ELIQUIS Take 1 tablet (5 mg total) by mouth 2 (two) times daily. Start taking tomorrow What changed: additional instructions   aspirin-acetaminophen-caffeine 250-250-65 MG tablet Commonly known as: EXCEDRIN MIGRAINE Take 1 tablet by mouth every 6 (six) hours as needed for headache.  diltiazem 300 MG 24 hr capsule Commonly known as: CARDIZEM CD TAKE 1 CAPSULE BY MOUTH EVERY DAY What changed: how much to take   ferrous sulfate 325 (65 FE) MG tablet Take 1 tablet (325 mg total) by mouth daily with breakfast. Start taking on: Dec 17, 2020   lisinopril 5 MG tablet Commonly known as: ZESTRIL TAKE 1 TABLET BY MOUTH EVERY  DAY   pantoprazole 40 MG tablet Commonly known as: PROTONIX Take 1 tablet (40 mg total) by mouth daily before breakfast. Start taking on: Dec 17, 2020       Follow-up Information    Gatha Mayer, MD.   Specialty: Gastroenterology Why: Office will contact patient with biopsy results and follow-up plans Contact information: [address] [place] [address] [phone]        [name], NP. Schedule an appointment as soon as possible for a visit in 1 week(s).   Specialty: Adult Health Nurse Practitioner Contact information: [address] [place] [address] [phone]              Allergies  Allergen Reactions  . Tensilon [Edrophonium] Other (See Comments)    Blacked out     Consultations:  GI   Procedures/Studies: DG Chest Port 1 View  Result Date: 12/14/2020 CLINICAL DATA:  Cough EXAM: PORTABLE CHEST 1 VIEW COMPARISON:  02/12/2020 FINDINGS: Cardiomegaly status post median sternotomy. Both lungs are clear. The visualized skeletal structures are unremarkable. IMPRESSION: Cardiomegaly without acute abnormality of the lungs in AP portable projection. Electronically Signed   By: Eddie Candle M.D.   On: 12/14/2020 14:17       Subjective:  Patient seen and examined the bedside this afternoon after endoscopy.  Medically stable for discharge   Discharge Exam: Vitals:   12/16/20 1506 12/16/20 1516  BP: 126/71 125/70  Pulse: (!) 55 (!) 54  Resp:    Temp:    SpO2: 98% 94%   Vitals:   12/16/20 1453 12/16/20 1458 12/16/20 1506 12/16/20 1516  BP: (!) 118/59 123/63 126/71 125/70  Pulse: 66 60 (!) 55 (!) 54  Resp:      Temp:  98.6 F (37 C)    TempSrc:  Oral    SpO2: 98% 97% 98% 94%  Weight:      Height:        General: Pt is alert, awake, not in acute distress Cardiovascular: RRR, S1/S2 +, no rubs, no gallops Respiratory: CTA bilaterally, no wheezing, no rhonchi Abdominal: Soft, NT, ND, bowel sounds + Extremities: no edema,  no cyanosis    The results of significant diagnostics from this hospitalization (including imaging, microbiology, ancillary and laboratory) are listed below for reference.     Microbiology: Recent Results (from the past 240 hour(s))  Resp Panel by RT-PCR (Flu A&B, Covid) Nasopharyngeal Swab     Status: Abnormal   Collection Time: 12/14/20  1:33 PM   Specimen: Nasopharyngeal Swab; Nasopharyngeal(NP) swabs in vial transport medium  Result Value Ref Range Status   SARS Coronavirus 2 by RT PCR POSITIVE (A) NEGATIVE Final    Comment: RESULT CALLED TO, READ BACK BY AND VERIFIED WITH: MASON,C. RN AT 4174 12/14/20 MULLINS,T  (NOTE) SARS-CoV-2 target nucleic acids are DETECTED.  The SARS-CoV-2 RNA is generally detectable in upper respiratory specimens during the acute phase of infection. Positive results are indicative of the presence of the identified virus, but do not rule out bacterial infection or co-infection with other pathogens not detected by the  test. Clinical correlation with patient history and other diagnostic information is necessary to determine patient infection status. The expected result is Negative.  Fact Sheet for Patients: EntrepreneurPulse.com.au  Fact Sheet for Healthcare Providers: IncredibleEmployment.be  This test is not yet approved or cleared by the [place] FDA and  has been authorized for detection and/or diagnosis of SARS-CoV-2 by FDA under an Emergency Use Authorization (EUA).  This EUA will remain in effect (meaning this test  can be used) for the duration of  the COVID-19 declaration under Section 564(b)(1) of the Act, 21 U.S.C. section 360bbb-3(b)(1), unless the authorization is terminated or revoked sooner.     Influenza A by PCR NEGATIVE NEGATIVE Final   Influenza B by PCR NEGATIVE NEGATIVE Final    Comment: (NOTE) The Xpert Xpress SARS-CoV-2/FLU/RSV plus assay is intended as an aid in the diagnosis of  influenza from Nasopharyngeal swab specimens and should not be used as a sole basis for treatment. Nasal washings and aspirates are unacceptable for Xpert Xpress SARS-CoV-2/FLU/RSV testing.  Fact Sheet for Patients: EntrepreneurPulse.com.au  Fact Sheet for Healthcare Providers: IncredibleEmployment.be  This test is not yet approved or cleared by the [place] FDA and has been authorized for detection and/or diagnosis of SARS-CoV-2 by FDA under an Emergency Use Authorization (EUA). This EUA will remain in effect (meaning this test can be used) for the duration of the COVID-19 declaration under Section 564(b)(1) of the Act, 21 U.S.C. section 360bbb-3(b)(1), unless the authorization is terminated or revoked.  Performed at [facility], [place] [address] [place], [place] [address]      Labs: BNP (last 3 results) No results for input(s): BNP in the last 8760 hours. Basic Metabolic Panel: Recent Labs  Lab 12/14/20 1540 12/15/20 0309 12/16/20 0157  NA 136 139 138  K 4.9 3.6 4.0  CL 110 113* 111  CO2 21* 21* 26  GLUCOSE 111* 122* 99  BUN 37* 21* 15  CREATININE 1.08 1.01 1.11  CALCIUM 8.5* 8.0* 8.1*  MG  --   --  2.1   Liver Function Tests: Recent Labs  Lab 12/14/20 1540  AST 25  ALT 10  ALKPHOS 38  BILITOT 0.3  PROT 5.8*  ALBUMIN 3.2*   No results for input(s): LIPASE, AMYLASE in the last 168 hours. No results for input(s): AMMONIA in the last 168 hours. CBC: Recent Labs  Lab 12/14/20 1555 12/14/20 1746 12/15/20 0309 12/15/20 0636 12/16/20 0157  WBC 4.5 5.1 4.2  --  5.7  NEUTROABS 3.4  --   --   --   --   HGB 3.9* 3.9* 5.4* 6.2* 8.1*  HCT 13.8* 13.8* 17.6* 20.3* 25.6*  MCV 84.1 82.6 84.6  --  84.8  PLT 267 298 280  --  273   Cardiac Enzymes: No results for input(s): CKTOTAL, CKMB, CKMBINDEX, TROPONINI in the last 168 hours. BNP: Invalid input(s): POCBNP CBG: No results for input(s): GLUCAP in  the last 168 hours. D-Dimer No results for input(s): DDIMER in the last 72 hours. Hgb A1c No results for input(s): HGBA1C in the last 72 hours. Lipid Profile No results for input(s): CHOL, HDL, LDLCALC, TRIG, CHOLHDL, LDLDIRECT in the last 72 hours. Thyroid function studies No results for input(s): TSH, T4TOTAL, T3FREE, THYROIDAB in the last 72 hours.  Invalid input(s): FREET3 Anemia work up Recent Labs    12/14/20 2020  FERRITIN 2*  TIBC 352  IRON 13*  RETICCTPCT 2.0   Urinalysis    Component Value Date/Time  COLORURINE AMBER (A) 02/12/2020 1926   APPEARANCEUR CLEAR 02/12/2020 1926   LABSPEC 1.027 02/12/2020 1926   PHURINE 5.0 02/12/2020 1926   GLUCOSEU NEGATIVE 02/12/2020 1926   GLUCOSEU NEGATIVE 05/16/2012 0908   HGBUR NEGATIVE 02/12/2020 [place] NEGATIVE 02/12/2020 1926   KETONESUR NEGATIVE 02/12/2020 1926   PROTEINUR 30 (A) 02/12/2020 1926   UROBILINOGEN 0.2 05/16/2012 0908   NITRITE NEGATIVE 02/12/2020 1926   LEUKOCYTESUR SMALL (A) 02/12/2020 1926   Sepsis Labs Invalid input(s): PROCALCITONIN,  WBC,  LACTICIDVEN Microbiology Recent Results (from the past 240 hour(s))  Resp Panel by RT-PCR (Flu A&B, Covid) Nasopharyngeal Swab     Status: Abnormal   Collection Time: 12/14/20  1:33 PM   Specimen: Nasopharyngeal Swab; Nasopharyngeal(NP) swabs in vial transport medium  Result Value Ref Range Status   SARS Coronavirus 2 by RT PCR POSITIVE (A) NEGATIVE Final    Comment: RESULT CALLED TO, READ BACK BY AND VERIFIED WITH: MASON,C. RN AT 0938 12/14/20 MULLINS,T  (NOTE) SARS-CoV-2 target nucleic acids are DETECTED.  The SARS-CoV-2 RNA is generally detectable in upper respiratory specimens during the acute phase of infection. Positive results are indicative of the presence of the identified virus, but do not rule out bacterial infection or co-infection with other pathogens not detected by the test. Clinical correlation with patient history and other  diagnostic information is necessary to determine patient infection status. The expected result is Negative.  Fact Sheet for Patients: EntrepreneurPulse.com.au  Fact Sheet for Healthcare Providers: IncredibleEmployment.be  This test is not yet approved or cleared by the [place] FDA and  has been authorized for detection and/or diagnosis of SARS-CoV-2 by FDA under an Emergency Use Authorization (EUA).  This EUA will remain in effect (meaning this test  can be used) for the duration of  the COVID-19 declaration under Section 564(b)(1) of the Act, 21 U.S.C. section 360bbb-3(b)(1), unless the authorization is terminated or revoked sooner.     Influenza A by PCR NEGATIVE NEGATIVE Final   Influenza B by PCR NEGATIVE NEGATIVE Final    Comment: (NOTE) The Xpert Xpress SARS-CoV-2/FLU/RSV plus assay is intended as an aid in the diagnosis of influenza from Nasopharyngeal swab specimens and should not be used as a sole basis for treatment. Nasal washings and aspirates are unacceptable for Xpert Xpress SARS-CoV-2/FLU/RSV testing.  Fact Sheet for Patients: EntrepreneurPulse.com.au  Fact Sheet for Healthcare Providers: IncredibleEmployment.be  This test is not yet approved or cleared by the [place] FDA and has been authorized for detection and/or diagnosis of SARS-CoV-2 by FDA under an Emergency Use Authorization (EUA). This EUA will remain in effect (meaning this test can be used) for the duration of the COVID-19 declaration under Section 564(b)(1) of the Act, 21 U.S.C. section 360bbb-3(b)(1), unless the authorization is terminated or revoked.  Performed at [facility], [place] [address], [place], [place] [address]     Please note: You were cared for by a hospitalist during your hospital stay. Once you are discharged, your primary care physician will handle any further medical issues.  Please note that NO REFILLS for any discharge medications will be authorized once you are discharged, as it is imperative that you return to your primary care physician (or establish a relationship with a primary care physician if you do not have one) for your post hospital discharge needs so that they can reassess your need for medications and monitor your lab values.    Time coordinating discharge: 40 minutes  SIGNED:  Shelly Coss, MD  [facility] 12/16/2020, 3:50 PM Pager [phone]  If 7PM-7AM, please contact night-coverage [email or web address] Password [email or web address]

## 2020-12-16 NOTE — Plan of Care (Signed)
  Problem: Clinical Measurements: Goal: Will remain free from infection Outcome: Progressing Goal: Diagnostic test results will improve Outcome: Progressing   Problem: Activity: Goal: Risk for activity intolerance will decrease Outcome: Progressing   

## 2020-12-16 NOTE — Transfer of Care (Signed)
Immediate Anesthesia Transfer of Care Note  Patient: Terry Golden.  Procedure(s) Performed: ESOPHAGOGASTRODUODENOSCOPY (EGD) WITH PROPOFOL (N/A ) BIOPSY  Patient Location: Endo room 4  Anesthesia Type:MAC  Level of Consciousness: awake, alert , oriented and patient cooperative  Airway & Oxygen Therapy: Patient Spontanous Breathing and Patient connected to face mask oxygen  Post-op Assessment: Report given to RN and Post -op Vital signs reviewed and stable  Post vital signs: Reviewed and stable  Last Vitals:  Vitals Value Taken Time  BP 118/59 12/16/20 1453  Temp    Pulse 61 12/16/20 1456  Resp 19 12/16/20 1449  SpO2 97 % 12/16/20 1456  Vitals shown include unvalidated device data.  Last Pain:  Vitals:   12/16/20 1449  TempSrc:   PainSc: 0-No pain         Complications: No complications documented.

## 2020-12-16 NOTE — Anesthesia Preprocedure Evaluation (Addendum)
Anesthesia Evaluation  Patient identified by MRN, date of birth, ID band Patient awake    Reviewed: Allergy & Precautions, NPO status , Patient's Chart, lab work & pertinent test results  Airway Mallampati: I       Dental no notable dental hx.    Pulmonary neg pulmonary ROS, Current Smoker and Patient abstained from smoking.,    Pulmonary exam normal        Cardiovascular hypertension, Pt. on medications +CHF  Normal cardiovascular exam+ dysrhythmias Atrial Fibrillation + Valvular Problems/Murmurs (h/o congenital heart disease, AV canal defect, ostium primum ASD, VSD, cleft mitral valve s/p repair 2000) MR   TTE 2020 1. The left ventricle has normal systolic function, with an ejection  fraction of 55-60%. The cavity size was normal. There is moderate  concentric left ventricular hypertrophy. Left ventricular diastolic  Doppler parameters are consistent with  pseudonormalization. Elevated left atrial and left ventricular end-diastolic pressures The E/e' is 24.  2. The right ventricle has normal systolic function. The cavity was normal. There is no increase in right ventricular wall thickness. Right ventricular systolic pressure is normal with an estimated pressure of 20.4 mmHg.  3. Left atrial size was mildly dilated.  4. A commisuroplasty valve is present in the mitral position. Procedure Date: 03/25/1999.  5. Mitral valve regurgitation is mild to moderate by color flow Doppler. Mild mitral valve stenosis.  6. The aortic valve is tricuspid. No stenosis of the aortic valve.  7. The aorta is normal unless otherwise noted.  8. The aortic root, ascending aorta and aortic arch are normal in size and structure.  9. 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 No residual shunting  seen.  Cath 2017 Mid LAD lesion, 20% stenosed. Minimal, nonobstructive CAD. The left ventricular systolic function is normal. No interventricular shunt noted. Normal right heart pressures. PA sat 66%. CO 4.1 L/min. CI 2.2. Continue medical therapy.  Serial echocardiograms to follow mitral valve disease.  Risk factor modification including avoiding tobacco.     Neuro/Psych negative neurological ROS  negative psych ROS   GI/Hepatic negative GI ROS, Neg liver ROS,   Endo/Other  negative endocrine ROS  Renal/GU negative Renal ROS  negative genitourinary   Musculoskeletal negative musculoskeletal ROS (+)   Abdominal Normal abdominal exam  (+)   Peds  Hematology  (+) Blood dyscrasia (on eliquis), anemia ,   Anesthesia Other Findings COVID positive on 12/14/20, asymptomatic, vaccinated with booster  Presented on 12/14/20 with weakness and dizziness found to have Hgb 3.9 now s/p 4U RBCs. Hgb 8.1  Reproductive/Obstetrics                            Anesthesia Physical Anesthesia Plan  ASA: III  Anesthesia Plan: MAC   Post-op Pain Management:    Induction: Intravenous  PONV Risk Score and Plan: Propofol infusion and Treatment may vary due to age or medical condition  Airway Management Planned: Natural Airway and Mask  Additional Equipment: None  Intra-op Plan:   Post-operative Plan:   Informed Consent: I have reviewed the patients History and Physical, chart, labs and discussed the procedure including the risks, benefits and alternatives for the proposed anesthesia with the patient or authorized representative who has indicated his/her understanding and acceptance.     Dental advisory given  Plan Discussed  with: CRNA  Anesthesia Plan Comments:        Anesthesia Quick Evaluation

## 2020-12-16 NOTE — Anesthesia Postprocedure Evaluation (Signed)
Anesthesia Post Note  Patient: Terry Golden.  Procedure(s) Performed: ESOPHAGOGASTRODUODENOSCOPY (EGD) WITH PROPOFOL (N/A ) BIOPSY     Patient location during evaluation: Endoscopy Anesthesia Type: MAC Level of consciousness: awake Pain management: pain level controlled Vital Signs Assessment: post-procedure vital signs reviewed and stable Respiratory status: spontaneous breathing Cardiovascular status: stable Postop Assessment: no apparent nausea or vomiting Anesthetic complications: no   No complications documented.  Last Vitals:  Vitals:   12/16/20 1453 12/16/20 1458  BP: (!) 118/59 123/63  Pulse: 66 60  Resp:    Temp:    SpO2: 98% 97%    Last Pain:  Vitals:   12/16/20 1449  TempSrc:   PainSc: 0-No pain                 Huston Foley

## 2020-12-17 ENCOUNTER — Encounter (HOSPITAL_COMMUNITY): Payer: Self-pay | Admitting: Internal Medicine

## 2020-12-17 LAB — SURGICAL PATHOLOGY

## 2021-01-02 ENCOUNTER — Other Ambulatory Visit: Payer: Self-pay

## 2021-01-02 DIAGNOSIS — D509 Iron deficiency anemia, unspecified: Secondary | ICD-10-CM

## 2021-01-02 DIAGNOSIS — D500 Iron deficiency anemia secondary to blood loss (chronic): Secondary | ICD-10-CM

## 2021-01-02 NOTE — Progress Notes (Signed)
amb ref gastro  

## 2021-01-13 ENCOUNTER — Ambulatory Visit (INDEPENDENT_AMBULATORY_CARE_PROVIDER_SITE_OTHER): Payer: Self-pay | Admitting: Internal Medicine

## 2021-01-13 ENCOUNTER — Encounter: Payer: Self-pay | Admitting: Internal Medicine

## 2021-01-13 DIAGNOSIS — D509 Iron deficiency anemia, unspecified: Secondary | ICD-10-CM

## 2021-01-13 NOTE — Patient Instructions (Signed)
CAPSULE ID: [id number]  Exp: 2022-03-18 LOT: [id number]  Patient arrived for capsule endoscopy. Reported the prep went well. Confirmed patient is fasting. Explained dietary restrictions for the next few hours. Patient verbalized understanding. Opened capsule, ensured capsule was flashing and transmitting to the recorder prior to the patient swallowing the capsule. Patient swallowed capsule without difficulty. Patient told to call the office with any questions. Understands to return to the office today between 4:00 and 4:30 pm. No further questions by the conclusion of the visit.

## 2021-01-26 ENCOUNTER — Other Ambulatory Visit: Payer: Self-pay | Admitting: Interventional Cardiology

## 2021-01-29 ENCOUNTER — Telehealth: Payer: Self-pay | Admitting: Internal Medicine

## 2021-01-29 ENCOUNTER — Other Ambulatory Visit: Payer: Self-pay

## 2021-01-29 DIAGNOSIS — D509 Iron deficiency anemia, unspecified: Secondary | ICD-10-CM

## 2021-01-29 DIAGNOSIS — D500 Iron deficiency anemia secondary to blood loss (chronic): Secondary | ICD-10-CM

## 2021-01-29 NOTE — Telephone Encounter (Signed)
Patient notified  I sent him Dr. Celesta Aver response also via MyChart at his request.  He will come for labs one day this or next week.

## 2021-01-29 NOTE — Telephone Encounter (Signed)
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

## 2021-02-05 ENCOUNTER — Other Ambulatory Visit (INDEPENDENT_AMBULATORY_CARE_PROVIDER_SITE_OTHER): Payer: Self-pay

## 2021-02-05 DIAGNOSIS — D500 Iron deficiency anemia secondary to blood loss (chronic): Secondary | ICD-10-CM

## 2021-02-05 DIAGNOSIS — D509 Iron deficiency anemia, unspecified: Secondary | ICD-10-CM

## 2021-02-05 LAB — CBC
HCT: 37.9 % — ABNORMAL LOW (ref 39.0–52.0)
Hemoglobin: 12.6 g/dL — ABNORMAL LOW (ref 13.0–17.0)
MCHC: 33.3 g/dL (ref 30.0–36.0)
MCV: 89.2 fl (ref 78.0–100.0)
Platelets: 263 10*3/uL (ref 150.0–400.0)
RBC: 4.25 Mil/uL (ref 4.22–5.81)
RDW: 20.8 % — ABNORMAL HIGH (ref 11.5–15.5)
WBC: 4.7 10*3/uL (ref 4.0–10.5)

## 2021-02-05 LAB — FERRITIN: Ferritin: 17 ng/mL — ABNORMAL LOW (ref 22.0–322.0)

## 2021-02-06 ENCOUNTER — Other Ambulatory Visit: Payer: Self-pay | Admitting: Internal Medicine

## 2021-02-06 DIAGNOSIS — D500 Iron deficiency anemia secondary to blood loss (chronic): Secondary | ICD-10-CM

## 2021-02-26 ENCOUNTER — Ambulatory Visit: Payer: Self-pay | Admitting: Nurse Practitioner

## 2021-03-01 ENCOUNTER — Other Ambulatory Visit: Payer: Self-pay | Admitting: Interventional Cardiology

## 2021-03-19 IMAGING — CT CT CHEST W/ CM
2 of 5 series · 12 of 36 positions shown, 15 images · IV contrast (omnipaque)
Comparison: Chest radiograph dated 12/04/2018.

CLINICAL DATA: 54-year-old male with abdominal trauma.

EXAM:
CT CHEST, ABDOMEN, AND PELVIS WITH CONTRAST
TECHNIQUE: Multidetector CT imaging of the chest, abdomen and pelvis was
performed following the standard protocol during bolus
administration of intravenous contrast.
CONTRAST:  100mL OMNIPAQUE IOHEXOL 300 MG/ML  SOLN

[Series 2: cap with · axial · 0.71mm/px · z∈[-822,-302]mm · 9 of 131 slices shown, 12 images]
[im 14/131  mediastinal]
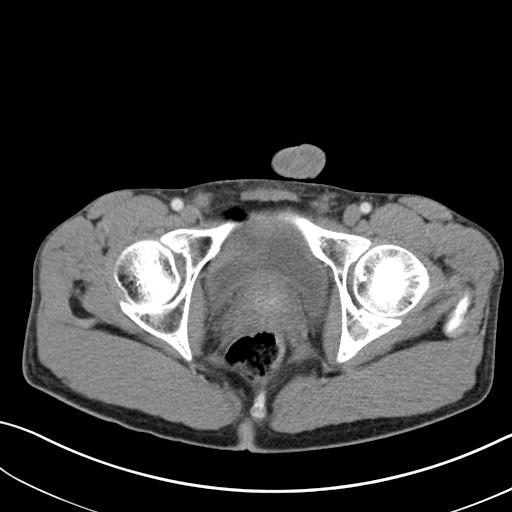
[im 14/131  lung]
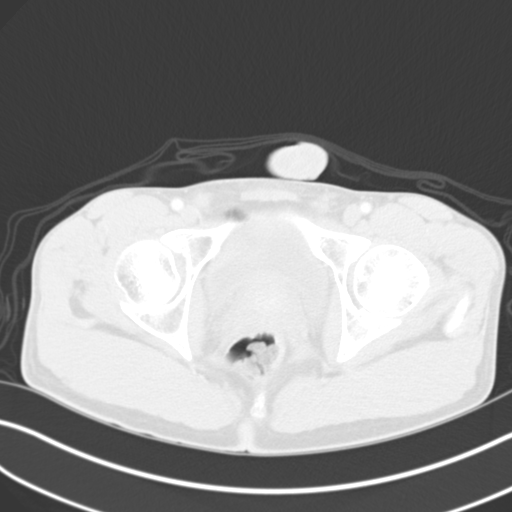
[im 27/131  lung]
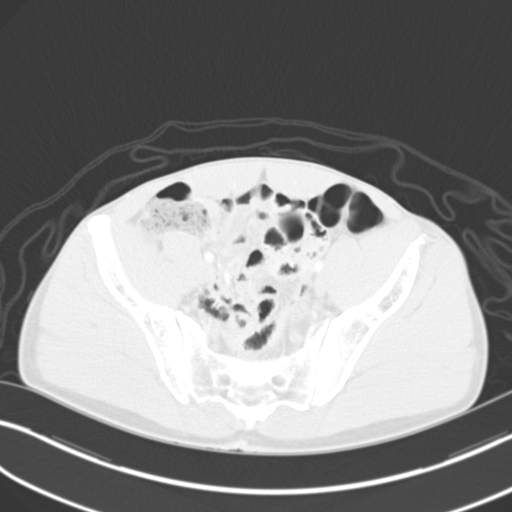
[im 40/131  lung]
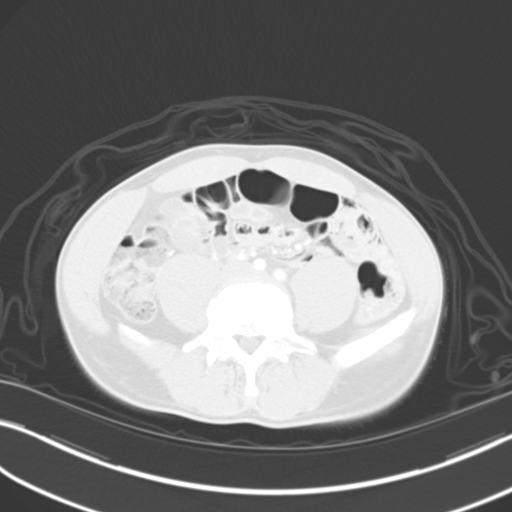
[im 53/131  lung]
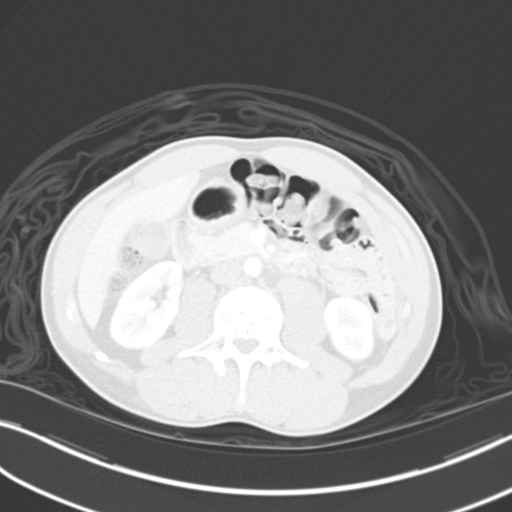
[im 66/131  mediastinal]
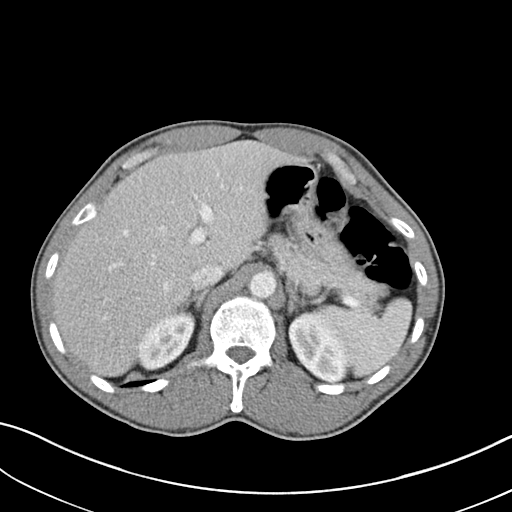
[im 66/131  lung]
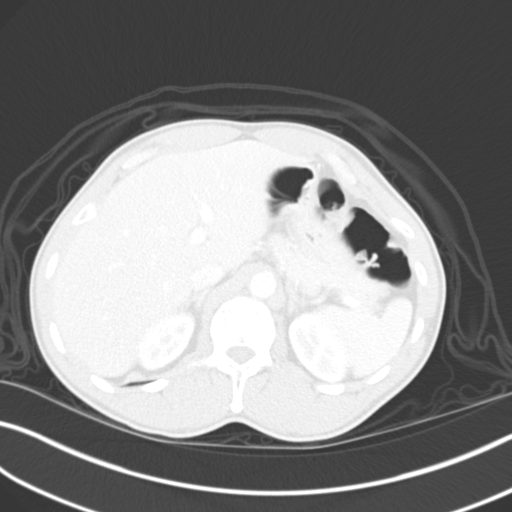
[im 79/131  lung]
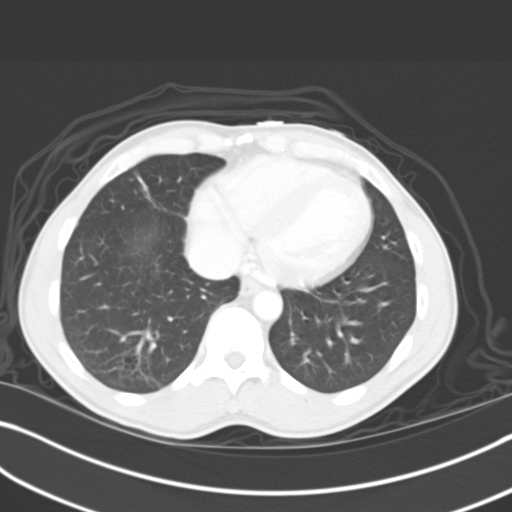
[im 92/131  lung]
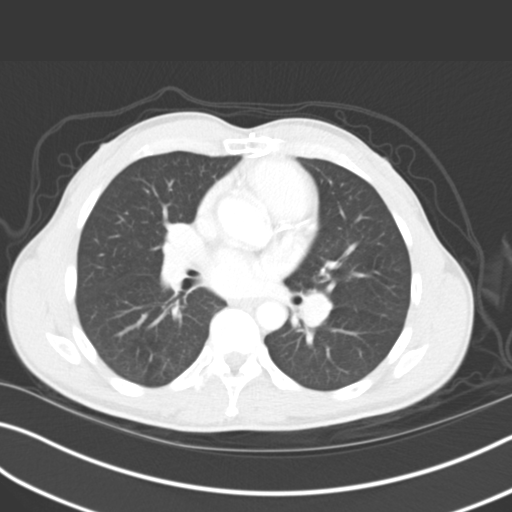
[im 105/131  lung]
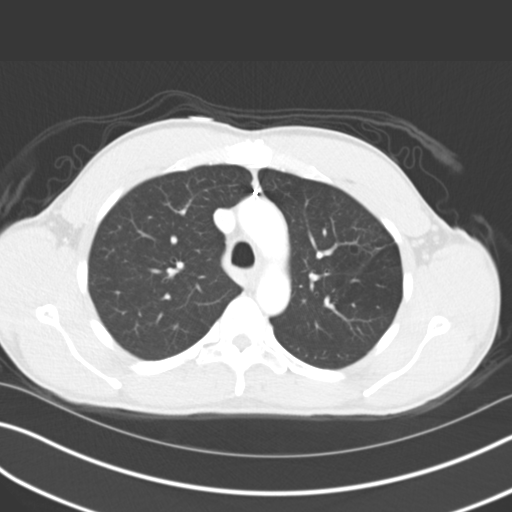
[im 118/131  mediastinal]
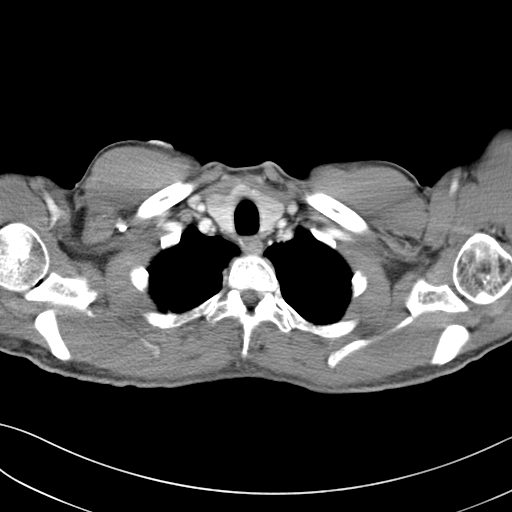
[im 118/131  lung]
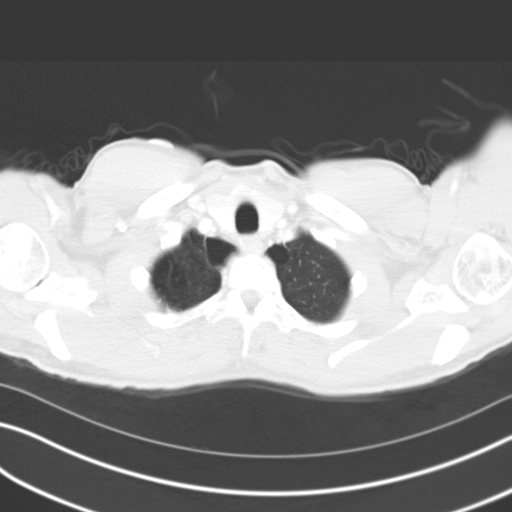

[Series 3: coronals · coronal · 0.67mm/px · 3 of 116 slices shown]
[im 24/116  lung]
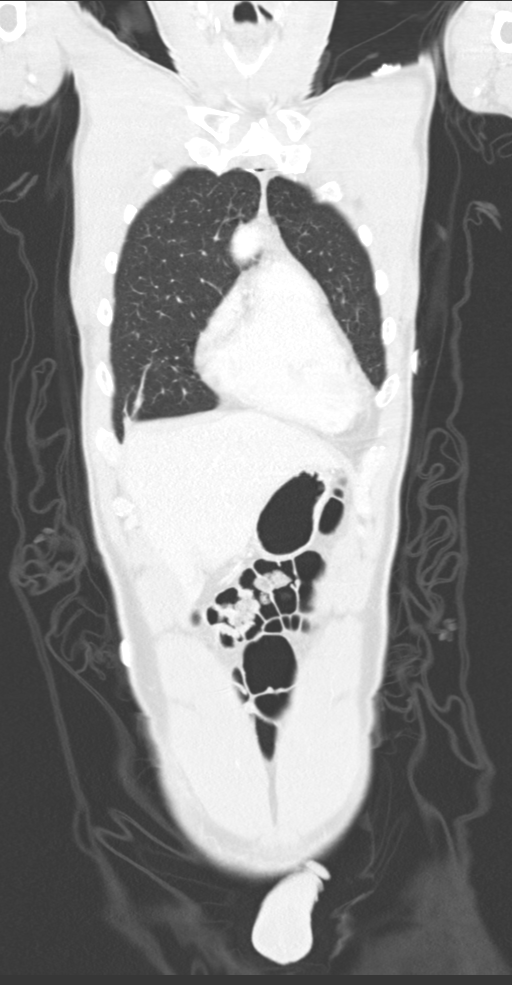
[im 47/116  lung]
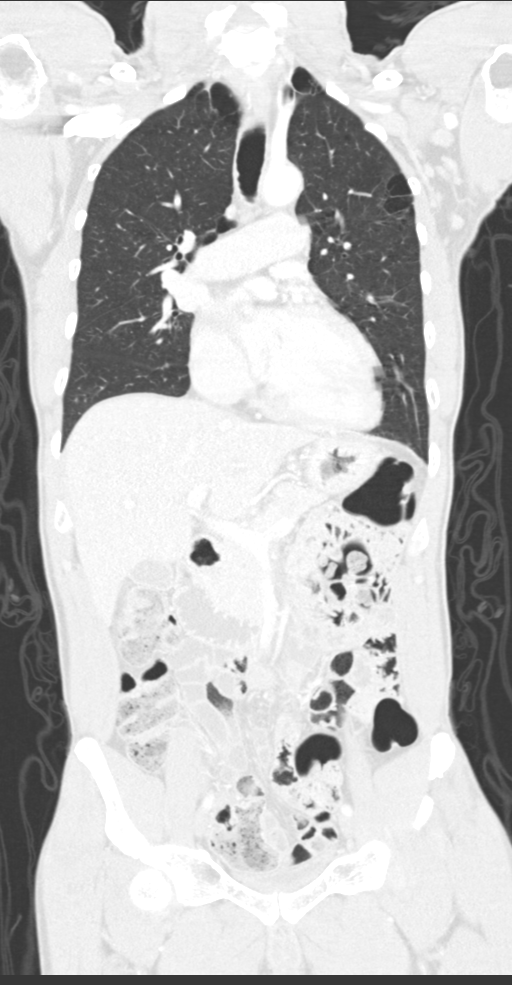
[im 70/116  lung]
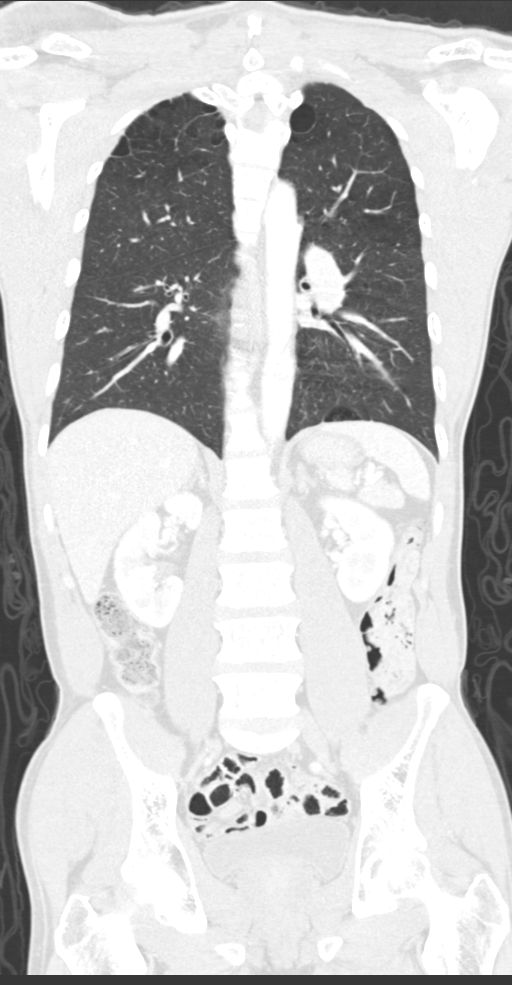

[12 of 36 positions shown; findings below may reference images not displayed]

FINDINGS: CT CHEST FINDINGS

Cardiovascular: Top-normal cardiac size. Three vessel coronary
vascular calcification. No pericardial effusion. The thoracic aorta
is unremarkable. The origins of the great vessels of the aortic arch
appear patent as visualized. Mild prominence of the central
pulmonary arteries may represent a degree of pulmonary hypertension.
Clinical correlation is recommended. The central pulmonary arteries
appear patent as visualized.

Mediastinum/Nodes: No hilar or mediastinal adenopathy. The esophagus
and the thyroid gland are grossly unremarkable. No mediastinal fluid
collection.

Lungs/Pleura: There is centrilobular and paraseptal emphysema. No
focal consolidation, pleural effusion, or pneumothorax. The central
airways are patent.

Musculoskeletal: Median sternotomy wires. No acute osseous
pathology. No displaced rib fractures.

CT ABDOMEN PELVIS FINDINGS

No intra-abdominal free air or free fluid.

Hepatobiliary: No focal liver abnormality is seen. No gallstones,
gallbladder wall thickening, or biliary dilatation.

Pancreas: Unremarkable. No pancreatic ductal dilatation or
surrounding inflammatory changes.

Spleen: Normal in size without focal abnormality.

Adrenals/Urinary Tract: Adrenal glands are unremarkable. Kidneys are
normal, without renal calculi, focal lesion, or hydronephrosis.
Bladder is unremarkable.

Stomach/Bowel: There is no bowel obstruction or active inflammation.
The appendix is normal.

Vascular/Lymphatic: There is mild aortoiliac atherosclerotic
disease. Apparent mild edema of the fat plane surrounding the SMA.
No fluid collection or hematoma. The IVC is unremarkable. No portal
venous gas. There is no adenopathy.

Reproductive: The prostate and seminal vesicles are grossly
unremarkable.

Other: None

Musculoskeletal: No acute or significant osseous findings.
IMPRESSION: No acute/traumatic intrathoracic, abdominal, or pelvic pathology.

## 2021-04-28 ENCOUNTER — Other Ambulatory Visit: Payer: Self-pay

## 2021-04-28 MED ORDER — APIXABAN 5 MG PO TABS: 5.0000 mg | ORAL_TABLET | Freq: Two times a day (BID) | ORAL | 1 refills | Status: DC

## 2021-04-28 NOTE — Telephone Encounter (Signed)
Pt last saw Dr [name] 07/15/20, last labs 12/16/20 Creat 1.11, age 55, weight 64.1kg, based on specified criteria pt is on appropriate dosage of Eliquis 5mg  BID.  Will refill rx.

## 2021-06-11 ENCOUNTER — Other Ambulatory Visit: Payer: Self-pay | Admitting: Interventional Cardiology

## 2021-09-08 ENCOUNTER — Other Ambulatory Visit: Payer: Self-pay | Admitting: Interventional Cardiology

## 2021-09-22 ENCOUNTER — Other Ambulatory Visit: Payer: Self-pay | Admitting: Interventional Cardiology

## 2021-10-11 ENCOUNTER — Other Ambulatory Visit: Payer: Self-pay | Admitting: Interventional Cardiology

## 2021-10-26 ENCOUNTER — Other Ambulatory Visit: Payer: Self-pay | Admitting: Interventional Cardiology

## 2021-10-28 NOTE — Telephone Encounter (Signed)
Refill request received for Lisinopril 5 mg qd. Pt's last OV 07/15/20 and was to f/u in 6 months. Recall letter was sent 01/11/21. Last refill (3rd and final attempt) was sent 10/13/21 for #15/0 ref. Please advise. ?

## 2021-10-29 MED ORDER — DILTIAZEM HCL ER COATED BEADS 300 MG PO CP24: 300.0000 mg | ORAL_CAPSULE | Freq: Every day | ORAL | 1 refills | Status: DC

## 2021-10-29 NOTE — Telephone Encounter (Signed)
I spoke with patient and scheduled him to see Melina Copa, PA on 12/09/21.  Prescription refills for Diltiazem and Lisinopril sent to pharmacy.  Patient aware to keep appointment on 5/9 for further refills.  ?

## 2021-10-29 NOTE — Telephone Encounter (Signed)
I placed call to patient and left message to call office 

## 2021-12-07 ENCOUNTER — Encounter: Payer: Self-pay | Admitting: Physician Assistant

## 2021-12-07 NOTE — Progress Notes (Signed)
? ?Cardiology Office Note   ? ?Date:  12/09/2021  ? ?ID:  Terry Santee., DOB 12/29/1965, MRN [id number] ? ?PCP:  [name], NP  ?Cardiologist:  [name], MD  ?Electrophysiologist:  [name], MD  ? ?Chief Complaint: f/u atrial flutter and congenital heart disease ? ?History of Present Illness:  ? ?Terry Jablonowski. is a 56 y.o. 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 ? ?History obtained from prior notes with [facility] as above. Prior cardiac cath 2017 showed minor 20% LAD, nonobstructive CAD. He was admitted 12/2018 with atrial flutter and RVR. EF was 40% and felt to be tachymediated. He was seen by Dr. [name] who recommended continuing diltiazem and he ultimately underwent TEE/DCCV 12/2018. Subsequent echos showed normal LVEF. Last echo 04/2019 is outlined below. He had an admission in 12/2020 for severe IDA with Hgb 3.9 requiring multiple transfusions in the context of Covid; FOBT negative, EGD showed gastritis and he was treated with Protonix and iron. His Hgb has since normalized by last labs 05/2021.  ? ?He returns for follow-up and is doing very well without any new cardiac complaints. BP is running high today but he has been out of his diltiazem and lisinopril for several weeks. He does have adequate supply of Eliquis at home per his report. No recent bleeding or additional hospitalizations since last year. Lipids are historically managed by primary care. ? ? ?Labwork independently reviewed: ?05/2021 [facility] Hgb 14.1, plt 240 ?01/2021 LDL 109 ?12/2020 Mg 2.1, K 4.0, Cr  1.11 - prior hyperkalemia noted ?2020 LDL 107 ? ?Cardiology Studies:  ? ?Studies reviewed are outlined and summarized above. Reports included below if pertinent.  ? ?2D echo 04/2019 ? ? ? 1. The left ventricle has normal systolic function, with an ejection  ?fraction of 55-60%. The cavity size was normal. There is moderate  ?concentric left ventricular hypertrophy. Left ventricular diastolic  ?Doppler parameters are consistent with  ?pseudonormalization. Elevated left atrial and left ventricular  ?end-diastolic pressures The E/e' is 24.  ? 2. The right ventricle has normal systolic function. The cavity was  ?normal. There is no increase in right ventricular wall thickness. Right  ?ventricular systolic pressure is normal with an estimated pressure of 20.4  ?mmHg.  ? 3. Left atrial size was mildly dilated.  ? 4. A commisuroplasty valve is present in the mitral position. Procedure  ?Date: 03/25/1999.  ? 5. Mitral valve regurgitation is mild to moderate by color flow Doppler.  ?Mild mitral valve stenosis.  ? 6. The aortic valve is tricuspid. No stenosis of the aortic valve.  ? 7. The aorta is normal unless otherwise noted.  ? 8. The aortic root, ascending aorta and aortic arch are normal in size  ?and structure.  ? 9. History of Congenital Heart disease History of AV canal defect with  ?reported ostium primum ASD, VSD, cleft mitral valve s/p repair of ASD, VSD  ?and repair of cleft mitral valve with commisuroplasty. No residual  ?shunting seen.  ? ?Monitor 2019 ?Normal sinus rhythm with rare PACs and PVCs. ?Short runs of atrial  tachycardia, longest was 11 beats. ?No sustained arrthyhmias. ? ?Cath 2017 ?Mid LAD lesion, 20% stenosed. Minimal, nonobstructive CAD. ?The left ventricular systolic function is normal. No interventricular shunt noted. ?Normal right heart pressures. PA sat 66%. CO 4.1 L/min. CI 2.2. ?  ?Continue medical therapy.  Serial echocardiograms to follow mitral valve disease.  Risk factor modification  including avoiding tobacco.  ? ?Cor CT 2017 ?MEDICATIONS: ?None ?TECHNIQUE: ?The patient was scanned on a Philips [id number] slice scanner. Gantry ?rotation speed was 270 msecs. Collimation was .51m. A 120 kV ?retrospective scan was triggered in the descending thoracic aorta at ?111 HU's . The 3D data set was interpreted on a dedicated work ?station using MPR, MIP and VRT modes. A total of 80cc of contrast ?was used. ?FINDINGS: ?[facility] ?Coronary Arteries: Right dominant with no anomalies ?LM: Normal ?LAD: Heavily calcified ostial proximal and mid vessel. Blooming ?artifact and motion from PAC;s degrades image. 50% or possibly ?greater stenosis ostial LAD proximal LAD. Less than 50% calcified ?stenosis ?In the mid LAD ?D1: Normal ?D2: Normal ?D3: Normal ?Circumflex:  Small not well seen ?RCA:  Dominant and normal ?PDA: Normal ?PLA:  Normal ? ?IMPRESSION: ?1) Difficult study to interpret due to PAC;s and extensive calcium ?in LAD. Possible greater than 50% stenosis in ostial/proximal LAD. ?Circumflex not well seen RCA dominant and normal ?Given location of lesion, family history and chest pain would ?consider diagnostic heart catheterization ?2) Moderate biatrial enlargement ?3) No residual ASD/VSD?  Area of patch repair near membranous septum ?4)?  Suture repair of clear anterior mitral leaflet ?5) Moderate RV enlargement ?6) Dilated left main pulmonary artery 31 mm ?Terry Golden?Electronically Signed ?  By: Terry RougeM.D. ?  On: 09/11/2015 18:01  ? ? ?Past Medical History:  ?Diagnosis Date  ? Anemia   ? ASD (atrial septal defect)   ? large   ? Congenital heart disease   ? ASVD, Cleft Mitral Valve  ? Hypertension   ? 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  ? right  ? Mild CAD   ? Mitral valve disease 03/25/1999  ? s/p mitral valve repair with commissuroplasty   ? N[place] ([place]mic cardiomyopathy) [place]   ? EF 40% in 2020, recovered  ? Rheumatic fever   ? Syncope   ? 1999  ?  Typical atrial flutter ([facility]   ? VSD (ventricular septal defect)   ? small  ? ? ?Past Surgical History:  ?Procedure Laterality Date  ? ASD AND VSD REPAIR    ? BIOPSY  12/16/2020  ? Procedure: BIOPSY;  Surgeon: GGatha Mayer MD;  [facility];  Service: Gastroenterology;;  ? CARDIAC CATHETERIZATION N/A 09/20/2015  ? Procedure: Right/Left Heart Cath and Coronary Angiography;  Surgeon: JJettie Booze MD;  [facility];  Service: Cardiovascular;  Laterality: N/A;  ? CARDIAC SURGERY    ? s/p partial AVSD repair and cleft mitral valve repair with commissuroplasty  ? CARDIOVERSION N/A 12/07/2018  ? Procedure: CARDIOVERSION;  Surgeon: NDorothy Spark MD;  [place] [facility]  Service: Cardiovascular;  Laterality: N/A;  ? COLONOSCOPY  07/08/2020  ? ESOPHAGOGASTRODUODENOSCOPY (EGD) WITH PROPOFOL N/A 12/16/2020  ? Procedure: ESOPHAGOGASTRODUODENOSCOPY (EGD) WITH PROPOFOL;  Surgeon: GGatha Mayer MD;  [facility];  Service: Gastroenterology;  Laterality: N/A;  ? LIPOMA EXCISION Right 06/19/2020  ? Procedure: EXCISION SUBCUTANEOUS LIPOMA RIGHT SHOULDER;  Surgeon: TDonnie Mesa MD;  [place] [facility]  Service: General;  Laterality: Right;  ? MITRAL  VALVE REPAIR    ? TEE WITHOUT CARDIOVERSION N/A 12/07/2018  ? Procedure: TRANSESOPHAGEAL ECHOCARDIOGRAM (TEE);  Surgeon: Dorothy Spark, MD;  Location: [place];  Service: Cardiovascular;  Laterality: N/A;  ? ? ?Current Medications: ?Current Meds  ?Medication Sig  ? amoxicillin (AMOXIL) 500 MG capsule Take 4 capsules by mouth 30-60 minutes prior to dental work  ? apixaban (ELIQUIS) 5 MG TABS tablet Take 1 tablet (5 mg total) by mouth 2 (two) times daily.  ? aspirin-acetaminophen-caffeine (EXCEDRIN MIGRAINE) 250-250-65 MG tablet Take 1 tablet by mouth every 6 (six) hours as needed for headache.  ? diltiazem (CARDIZEM CD) 300 MG 24 hr capsule Take 1 capsule (300 mg total) by mouth daily.  ? ferrous sulfate 325 (65 FE) MG tablet  Take 1 tablet (325 mg total) by mouth daily with breakfast.  ? lisinopril (ZESTRIL) 5 MG tablet TAKE 1 TABLET BY MOUTH EVERY DAY  ? pantoprazole (PROTONIX) 40 MG tablet Take 1 tablet (40 mg total) by mouth da

## 2021-12-09 ENCOUNTER — Ambulatory Visit (INDEPENDENT_AMBULATORY_CARE_PROVIDER_SITE_OTHER): Payer: Self-pay | Admitting: Physician Assistant

## 2021-12-09 ENCOUNTER — Encounter: Payer: Self-pay | Admitting: Physician Assistant

## 2021-12-09 VITALS — BP 134/74 | HR 65 | Ht 69.0 in | Wt 139.8 lb

## 2021-12-09 DIAGNOSIS — Q249 Congenital malformation of heart, unspecified: Secondary | ICD-10-CM

## 2021-12-09 DIAGNOSIS — D509 Iron deficiency anemia, unspecified: Secondary | ICD-10-CM

## 2021-12-09 DIAGNOSIS — I4892 Unspecified atrial flutter: Secondary | ICD-10-CM

## 2021-12-09 DIAGNOSIS — I428 Other cardiomyopathies: Secondary | ICD-10-CM

## 2021-12-09 DIAGNOSIS — I059 Rheumatic mitral valve disease, unspecified: Secondary | ICD-10-CM

## 2021-12-09 DIAGNOSIS — I2510 Atherosclerotic heart disease of native coronary artery without angina pectoris: Secondary | ICD-10-CM

## 2021-12-09 LAB — CBC
Hematocrit: 36.1 % — ABNORMAL LOW (ref 37.5–51.0)
Hemoglobin: 12.1 g/dL — ABNORMAL LOW (ref 13.0–17.7)
MCH: 32.2 pg (ref 26.6–33.0)
MCHC: 33.5 g/dL (ref 31.5–35.7)
MCV: 96 fL (ref 79–97)
Platelets: 296 10*3/uL (ref 150–450)
RBC: 3.76 x10E6/uL — ABNORMAL LOW (ref 4.14–5.80)
RDW: 12.3 % (ref 11.6–15.4)
WBC: 4.7 10*3/uL (ref 3.4–10.8)

## 2021-12-09 LAB — BASIC METABOLIC PANEL
BUN/Creatinine Ratio: 12 (ref 9–20)
BUN: 11 mg/dL (ref 6–24)
CO2: 21 mmol/L (ref 20–29)
Calcium: 9.2 mg/dL (ref 8.7–10.2)
Chloride: 106 mmol/L (ref 96–106)
Creatinine, Ser: 0.95 mg/dL (ref 0.76–1.27)
Glucose: 105 mg/dL — ABNORMAL HIGH (ref 70–99)
Potassium: 3.9 mmol/L (ref 3.5–5.2)
Sodium: 141 mmol/L (ref 134–144)
eGFR: 94 mL/min/{1.73_m2} (ref >59)

## 2021-12-09 MED ORDER — AMOXICILLIN 500 MG PO CAPS: ORAL_CAPSULE | ORAL | 3 refills | Status: AC

## 2021-12-09 MED ORDER — DILTIAZEM HCL ER COATED BEADS 300 MG PO CP24: 300.0000 mg | ORAL_CAPSULE | Freq: Every day | ORAL | 3 refills | Status: DC

## 2021-12-09 MED ORDER — LISINOPRIL 5 MG PO TABS: 5.0000 mg | ORAL_TABLET | Freq: Every day | ORAL | 3 refills | Status: DC

## 2021-12-09 NOTE — Patient Instructions (Signed)
Medication Instructions:  ?Your physician recommends that you continue on your current medications as directed. Please refer to the Current Medication list given to you today. ?*If you need a refill on your cardiac medications before your next appointment, please call your pharmacy* ? ? ?Lab Work: ?TODAY-BMET, CBC ?If you have labs (blood work) drawn today and your tests are completely normal, you will receive your results only by: ?MyChart Message (if you have MyChart) OR ?A paper copy in the mail ?If you have any lab test that is abnormal or we need to change your treatment, we will call you to review the results. ? ? ?Testing/Procedures: ?Your physician has requested that you have an echocardiogram. Echocardiography is a painless test that uses sound waves to create images of your heart. It provides your doctor with information about the size and shape of your heart and how well your heart?s chambers and valves are working. This procedure takes approximately one hour. There are no restrictions for this procedure. ? ? ?Follow-Up: ?At [facility], you and your health needs are our priority.  As part of our continuing mission to provide you with exceptional heart care, we have created designated Provider Care Teams.  These Care Teams include your primary Cardiologist (physician) and Advanced Practice Providers (APPs -  Physician Assistants and Nurse Practitioners) who all work together to provide you with the care you need, when you need it. ? ?We recommend signing up for the patient portal called "MyChart".  Sign up information is provided on this After Visit Summary.  MyChart is used to connect with patients for Virtual Visits (Telemedicine).  Patients are able to view lab/test results, encounter notes, upcoming appointments, etc.  Non-urgent messages can be sent to your provider as well.   ?To learn more about what you can do with MyChart, go to [email or web address].   ? ?Your next appointment:   ?3  week(s) ? ?The format for your next appointment:   ?In Person ? ?Provider:   ?Melina Copa, PA   ? ? ?Other Instructions ? ?Endocarditis Information  ?You may be at risk for developing endocarditis since you have an artificial heart valve or a repaired heart valve. Endocarditis is an infection of the lining of the heart or heart valves. Certain surgical and dental procedures may put you at risk, such as teeth cleaning or other dental procedures or other medical procedures. Notify our office or your dentist before having any dental work or invasive/surgical procedures. You will need to take antibiotics before certain procedures. To prevent endocarditis, maintain good oral health. Seek prompt medical attention for any mouth/gum, skin or urinary tract infections. ? ?Important Information About Sugar ? ? ? ? ? ?

## 2021-12-15 ENCOUNTER — Other Ambulatory Visit: Payer: Self-pay

## 2021-12-15 DIAGNOSIS — I059 Rheumatic mitral valve disease, unspecified: Secondary | ICD-10-CM

## 2021-12-15 DIAGNOSIS — D509 Iron deficiency anemia, unspecified: Secondary | ICD-10-CM

## 2021-12-15 DIAGNOSIS — I4892 Unspecified atrial flutter: Secondary | ICD-10-CM

## 2021-12-15 DIAGNOSIS — Q249 Congenital malformation of heart, unspecified: Secondary | ICD-10-CM

## 2021-12-15 DIAGNOSIS — I428 Other cardiomyopathies: Secondary | ICD-10-CM

## 2021-12-15 DIAGNOSIS — I2510 Atherosclerotic heart disease of native coronary artery without angina pectoris: Secondary | ICD-10-CM

## 2021-12-23 ENCOUNTER — Other Ambulatory Visit: Payer: Self-pay

## 2021-12-23 NOTE — Progress Notes (Signed)
Cardiology Office Note    Date:  12/30/2021   ID:  Terry Santee., DOB 1965/10/27, MRN [id number]  PCP:  [name], NP  Cardiologist:  [name], MD  Electrophysiologist:  [name], MD   Chief Complaint: f/u echo, BP  History of Present Illness:   Terry Dambach. is a 56 y.o. 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   History obtained from prior notes with congenital heart disease as above. Prior cardiac cath 2017 showed minor 20% LAD, nonobstructive CAD. He was admitted 12/2018 with atrial flutter and RVR. EF was 40% and felt to be tachymediated. He was seen by Dr. [name] who recommended continuing diltiazem and he ultimately underwent TEE/DCCV 12/2018. Subsequent echos showed normal LVEF. He had an admission in 12/2020 for severe IDA with Hgb 3.9 requiring multiple transfusions in the context of Covid; FOBT negative. EGD showed gastritis and he was treated with Protonix and iron. His Hgb has since normalized. When I saw him recently 12/09/21 he was doing well except had been out of his diltiazem and lisinopril for several weeks. These were restarted with plan for repeat surveillance echo as well. Echo was performed 12/26/21 showing findings below with now severe MR that is central (previously mild in 2020), with restricted movement in systole/diastole - only 1 prominent papillary muscle was seen so parachute-like mitral valve was also a possibility given congenital heart history. There was also mild MS, EF 50-55%, mild LVH, moderately reduced RVF, mild RVE, severely dilated LA, &  mildly dilated RA.  He is seen for follow-up today to discuss his echocardiogram results. In retrospect he does report that he's felt more fatigued the last few months with exertion. No overt chest pain, shortness of breath, edema, palpitations, dizziness, or syncope. Blood pressure is well controlled back on home regimen. He did not eat yet today.   Labwork independently reviewed: 12/2021 Hgb 12.1, plt wnl, K 3.9, Cr 0.95 05/2021 [facility] Hgb 14.1, plt 240 01/2021 LDL 109 12/2020 Mg 2.1, K 4.0, Cr 1.11 - prior hyperkalemia noted    Cardiology Studies:   Studies reviewed are outlined and summarized above. Reports included below if pertinent.   2d echo 12/26/21   1. 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 There is  severe MV regurgitation that is central. The leaflets are thickened with  restricted movement in systole/diastole possibly related to rheumatic  heart disease. Only 1 prominent papillary  muscle is seen so parachute-like mitral valve is also a possibility given  congenital heart history. MG 3 mmHG @ 55 bpm. There is systolic flow  reversal in the right inferior PV. Would recommend a TEE for  calrification. The mitral valve is abnormal.  Severe mitral valve regurgitation. Mild mitral stenosis. There is a  commisuroplasty present in the mitral position. Procedure Date: 03/25/1999.   2. Left ventricular ejection fraction, by estimation, is 50 to 55%. The  left ventricle has low normal function. The left ventricle has no regional  wall motion abnormalities. There is mild concentric left ventricular  hypertrophy. Left ventricular  diastolic function could not be  evaluated.   3. Right ventricular systolic function is moderately reduced. The right  ventricular size is mildly enlarged. Tricuspid regurgitation signal is  inadequate for assessing PA pressure.   4. Left atrial size was severely dilated.   5. History of Primum ASD repair.   6. Right atrial size was  mildly dilated.   7. The aortic valve is tricuspid. Aortic valve regurgitation is not  visualized. No aortic stenosis is present.   8. The inferior vena cava is normal in size with greater than 50%  respiratory variability, suggesting right atrial pressure of 3 mmHg.   Comparison(s): Changes from prior study are noted. MR is severe. EF ~50%.     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   right   Mild CAD    Mitral valve disease 03/25/1999   s/p mitral valve repair with commissuroplasty    NICM (nonischemic cardiomyopathy) ([place])    EF 40% in 2020, recovered   Rheumatic fever    Syncope    1999   Typical atrial flutter ([place])    VSD (ventricular septal defect)    small    Past Surgical History:  Procedure Laterality Date   ASD AND VSD REPAIR     BIOPSY  12/16/2020   Procedure: BIOPSY;  Surgeon: Gatha Mayer, MD;  Location: [facility];  Service: Gastroenterology;;   CARDIAC CATHETERIZATION N/A 09/20/2015   Procedure: Right/Left Heart Cath and Coronary Angiography;  Surgeon: Jettie Booze, MD;  Location: [facility];  Service: Cardiovascular;  Laterality: N/A;   CARDIAC SURGERY     s/p partial AVSD repair and cleft mitral valve repair with commissuroplasty   CARDIOVERSION N/A 12/07/2018   Procedure: CARDIOVERSION;  Surgeon: Dorothy Spark, MD;  Location: [facility] ENDOSCOPY;  Service: Cardiovascular;  Laterality: N/A;   COLONOSCOPY  07/08/2020   ESOPHAGOGASTRODUODENOSCOPY (EGD) WITH PROPOFOL N/A 12/16/2020   Procedure: ESOPHAGOGASTRODUODENOSCOPY (EGD) WITH PROPOFOL;  Surgeon: Gatha Mayer, MD;  Location: [facility] ENDOSCOPY;  Service: Gastroenterology;  Laterality: N/A;   LIPOMA EXCISION Right 06/19/2020   Procedure: EXCISION SUBCUTANEOUS LIPOMA RIGHT SHOULDER;  Surgeon: Donnie Mesa, MD;  Location: [facility];  Service:  General;  Laterality: Right;   MITRAL VALVE REPAIR     TEE WITHOUT CARDIOVERSION N/A 12/07/2018   Procedure: TRANSESOPHAGEAL ECHOCARDIOGRAM (TEE);  Surgeon: Dorothy Spark, MD;  Location: [facility] ENDOSCOPY;  Service: Cardiovascular;  Laterality: N/A;    Current Medications: Current Meds  Medication Sig   amoxicillin (AMOXIL) 500 MG capsule Take 4 capsules by mouth 30-60 minutes prior to dental work   apixaban (ELIQUIS) 5 MG TABS tablet Take 1 tablet (5 mg total) by mouth 2 (two) times daily.   aspirin-acetaminophen-caffeine (EXCEDRIN MIGRAINE) 250-250-65 MG tablet Take 1 tablet by mouth every 6 (six) hours as needed for headache.   diltiazem (CARDIZEM CD) 300 MG 24 hr capsule Take 1 capsule (300 mg total) by mouth daily.   ferrous sulfate 325 (65 FE) MG tablet Take 1 tablet (325 mg total) by mouth daily with breakfast.   lisinopril (ZESTRIL) 5 MG tablet Take 1 tablet (5 mg total) by mouth daily.   pantoprazole (PROTONIX) 40 MG tablet Take 1 tablet (40 mg total) by mouth daily before breakfast.      Allergies:   Tensilon [edrophonium]  Social History   Socioeconomic History   Marital status: Divorced    Spouse name: Not on file   Number of children: 1   Years of education: 12   Highest education level: Not on file  Occupational History   Not on file  Tobacco Use   Smoking status: Every Day    Packs/day: 0.25    Years: 42.00    Pack years: 10.50    Types: Cigarettes   Smokeless tobacco: Never   Tobacco comments:    3 cigarettes per day( 07/08/20)  Vaping Use   Vaping Use: Never used  Substance and Sexual Activity   Alcohol use: Never    Alcohol/week: 0.0 standard drinks   Drug use: No   Sexual activity: Not on file  Other Topics Concern   Not on file  Social History Narrative   Not on file   Social Determinants of Health   Financial Resource Strain: Not on file  Food Insecurity: Not on file  Transportation Needs: Not on file  Physical Activity: Not on file   Stress: Not on file  Social Connections: Not on file     Family History:  The patient's family history includes Alcohol abuse in an other family member; Arthritis in some other family members; Breast cancer in an other family member; Diabetes in his sister; Heart attack in his father; Heart disease in his sister and other family members; Hypertension in an other family member; Kidney disease in an other family member; Mental illness in his sister and another family member; Stroke in his father and another family member. There is no history of Colon cancer, Colon polyps, Esophageal cancer, Rectal cancer, or Stomach cancer.  ROS:   Please see the history of present illness.  All other systems are reviewed and otherwise negative.    EKG(s)/Additional Labs   EKG:  EKG is ordered today since we need to have this in date within 30 days of TEE - shows SB 55bpm, occasional PACs, probable LAE, incompelte RBBB, LAFB, LVH with TWI inferiorly and V4-V6, similar to prior (see 12/2020)  Recent Labs: 12/09/2021: BUN 11; Creatinine, Ser 0.95; Hemoglobin 12.1; Platelets 296; Potassium 3.9; Sodium 141  Recent Lipid Panel    Component Value Date/Time   CHOL 161 12/04/2018 0859   CHOL 176 09/05/2018 1000   TRIG 53 12/04/2018 0859   HDL 43 12/04/2018 0859   HDL 54 09/05/2018 1000   CHOLHDL 3.7 12/04/2018 0859   VLDL 11 12/04/2018 0859   LDLCALC 107 (H) 12/04/2018 0859   LDLCALC 113 (H) 09/05/2018 1000    PHYSICAL EXAM:    VS:  BP 120/68   Pulse (!) 54   Ht '5\' 9"'$  (1.753 m)   Wt 137 lb (62.1 kg)   SpO2 95%   BMI 20.23 kg/m   BMI: Body mass index is 20.23 kg/m.  GEN: Well nourished, well developed male in no acute distress HEENT: normocephalic, atraumatic Neck: no JVD, carotid bruits, or masses Cardiac: RRR; + soft SEM heard best at apex, no rubs or gallops, no edema  Respiratory:  clear to auscultation bilaterally, normal work of breathing GI: soft, nontender, nondistended, + BS MS: no  deformity or atrophy Skin: warm and dry, no rash, prominent vasculature Neuro:  Alert and Oriented x 3, Strength and sensation are intact, follows commands Psych: euthymic mood, full affect  Wt Readings from Last 3 Encounters:  12/30/21 137 lb (62.1 kg)  12/09/21 139 lb 12.8 oz (63.4 kg)  12/16/20 141  lb 5 oz (64.1 kg)     ASSESSMENT & PLAN:   1. Congenital heart disease now with severe mitral regurgitation - reviewed echo result with Dr. [name]; we would recommend moving forward with TEE. Discussed echo result and recommendation for TEE with patient who is agreeable. We will arrange. We are getting updated labs below - I called endoscopy personally to ensure they'll be acceptable in date and endo said as long as they are within 2 weeks, that's fine. Also reminded patient of need for SBE ppx (has amoxicillin rx on file from last visit).  Shared Decision Making/Informed Consent The risks [esophageal damage, perforation (1:10,000 risk), bleeding, pharyngeal hematoma as well as other potential complications associated with conscious sedation including aspiration, arrhythmia, respiratory failure and death], benefits (treatment guidance and diagnostic support) and alternatives of a transesophageal echocardiogram were discussed in detail with Terry Golden and he is willing to proceed.   2. Essential HTN - BP now well controlled on lisinopril and diltiazem. Repeat labs planned today to ensure K, Cr stable back on ACEi.  3. H/o presumed NICM due to atrial flutter - EF 50-55% by echo (has been variable over the years - 45-50% in 01/2019, 55-60% in 04/2019, now 50-55%). No changes made in regimen today. He appears euvolemic and is maintaining NSR/SB HR 50s on diltiazem. He will continue Eliquis at this time. We did discuss the OTC Excedrin he has on his list, as I would recommend to limit this medicine since it contains ASA. We will also trend his CBC today to ensure Hgb remains stable. (Was 14.1 in 05/2021  then 12.1 at last visit.)  4. Mild nonobstructive CAD - not on ASA due to concomitant Eliquis. He has not eaten yet today so we will get CMET/lipid profile. Would consider statin therapy for goal LDL <70. Had 3V cor calcification on chest CT 02/2020 as well. No recent anginal symptoms.     Disposition: F/u with myself or Dr. [name] in 4 weeks tentatively, though further plans will depend on TEE result in case he needs to see our structural team or cardiothoracic surgery team.   Medication Adjustments/Labs and Tests Ordered: Current medicines are reviewed at length with the patient today.  Concerns regarding medicines are outlined above. Medication changes, Labs and Tests ordered today are summarized above and listed in the Patient Instructions accessible in Encounters.   Signed, Terry Pitter, PA-C  12/30/2021 9:05 AM    [place] Phone: [phone]; Fax: [phone]

## 2021-12-23 NOTE — H&P (View-Only) (Signed)
Cardiology Office Note    Date:  12/30/2021   ID:  Terry Golden., DOB 1966/02/24, MRN [id number]  PCP:  [name], NP  Cardiologist:  [name], MD  Electrophysiologist:  [name], MD   Chief Complaint: f/u echo, BP  History of Present Illness:   Terry Golden. is a 56 y.o. 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   History obtained from prior notes with congenital heart disease as above. Prior cardiac cath 2017 showed minor 20% LAD, nonobstructive CAD. He was admitted 12/2018 with atrial flutter and RVR. EF was 40% and felt to be tachymediated. He was seen by Dr. [name] who recommended continuing diltiazem and he ultimately underwent TEE/DCCV 12/2018. Subsequent echos showed normal LVEF. He had an admission in 12/2020 for severe IDA with Hgb 3.9 requiring multiple transfusions in the context of Covid; FOBT negative. EGD showed gastritis and he was treated with Protonix and iron. His Hgb has since normalized. When I saw him recently 12/09/21 he was doing well except had been out of his diltiazem and lisinopril for several weeks. These were restarted with plan for repeat surveillance echo as well. Echo was performed 12/26/21 showing findings below with now severe MR that is central (previously mild in 2020), with restricted movement in systole/diastole - only 1 prominent papillary muscle was seen so parachute-like mitral valve was also a possibility given congenital heart history. There was also mild MS, EF 50-55%, mild LVH, moderately reduced RVF, mild RVE, severely dilated LA, &  mildly dilated RA.  He is seen for follow-up today to discuss his echocardiogram results. In retrospect he does report that he's felt more fatigued the last few months with exertion. No overt chest pain, shortness of breath, edema, palpitations, dizziness, or syncope. Blood pressure is well controlled back on home regimen. He did not eat yet today.   Labwork independently reviewed: 12/2021 Hgb 12.1, plt wnl, K 3.9, Cr 0.95 05/2021 [facility] Hgb 14.1, plt 240 01/2021 LDL 109 12/2020 Mg 2.1, K 4.0, Cr 1.11 - prior hyperkalemia noted    Cardiology Studies:   Studies reviewed are outlined and summarized above. Reports included below if pertinent.   2d echo 12/26/21   1. 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 There is  severe MV regurgitation that is central. The leaflets are thickened with  restricted movement in systole/diastole possibly related to rheumatic  heart disease. Only 1 prominent papillary  muscle is seen so parachute-like mitral valve is also a possibility given  congenital heart history. MG 3 mmHG @ 55 bpm. There is systolic flow  reversal in the right inferior PV. Would recommend a TEE for  calrification. The mitral valve is abnormal.  Severe mitral valve regurgitation. Mild mitral stenosis. There is a  commisuroplasty present in the mitral position. Procedure Date: 03/25/1999.   2. Left ventricular ejection fraction, by estimation, is 50 to 55%. The  left ventricle has low normal function. The left ventricle has no regional  wall motion abnormalities. There is mild concentric left ventricular  hypertrophy. Left ventricular  diastolic function could not be  evaluated.   3. Right ventricular systolic function is moderately reduced. The right  ventricular size is mildly enlarged. Tricuspid regurgitation signal is  inadequate for assessing PA pressure.   4. Left atrial size was severely dilated.   5. History of Primum ASD repair.   6. Right atrial size was  mildly dilated.   7. The aortic valve is tricuspid. Aortic valve regurgitation is not  visualized. No aortic stenosis is present.   8. The inferior vena cava is normal in size with greater than 50%  respiratory variability, suggesting right atrial pressure of 3 mmHg.   Comparison(s): Changes from prior study are noted. MR is severe. EF ~50%.     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   right   Mild CAD    Mitral valve disease 03/25/1999   s/p mitral valve repair with commissuroplasty    NICM (nonischemic cardiomyopathy) ([place])    EF 40% in 2020, recovered   Rheumatic fever    Syncope    1999   Typical atrial flutter ([place])    VSD (ventricular septal defect)    small    Past Surgical History:  Procedure Laterality Date   ASD AND VSD REPAIR     BIOPSY  12/16/2020   Procedure: BIOPSY;  Surgeon: Gatha Mayer, MD;  Location: [facility];  Service: Gastroenterology;;   CARDIAC CATHETERIZATION N/A 09/20/2015   Procedure: Right/Left Heart Cath and Coronary Angiography;  Surgeon: Jettie Booze, MD;  Location: [facility];  Service: Cardiovascular;  Laterality: N/A;   CARDIAC SURGERY     s/p partial AVSD repair and cleft mitral valve repair with commissuroplasty   CARDIOVERSION N/A 12/07/2018   Procedure: CARDIOVERSION;  Surgeon: Dorothy Spark, MD;  Location: [facility] ENDOSCOPY;  Service: Cardiovascular;  Laterality: N/A;   COLONOSCOPY  07/08/2020   ESOPHAGOGASTRODUODENOSCOPY (EGD) WITH PROPOFOL N/A 12/16/2020   Procedure: ESOPHAGOGASTRODUODENOSCOPY (EGD) WITH PROPOFOL;  Surgeon: Gatha Mayer, MD;  Location: [facility] ENDOSCOPY;  Service: Gastroenterology;  Laterality: N/A;   LIPOMA EXCISION Right 06/19/2020   Procedure: EXCISION SUBCUTANEOUS LIPOMA RIGHT SHOULDER;  Surgeon: Donnie Mesa, MD;  Location: [facility];  Service:  General;  Laterality: Right;   MITRAL VALVE REPAIR     TEE WITHOUT CARDIOVERSION N/A 12/07/2018   Procedure: TRANSESOPHAGEAL ECHOCARDIOGRAM (TEE);  Surgeon: Dorothy Spark, MD;  Location: [facility] ENDOSCOPY;  Service: Cardiovascular;  Laterality: N/A;    Current Medications: Current Meds  Medication Sig   amoxicillin (AMOXIL) 500 MG capsule Take 4 capsules by mouth 30-60 minutes prior to dental work   apixaban (ELIQUIS) 5 MG TABS tablet Take 1 tablet (5 mg total) by mouth 2 (two) times daily.   aspirin-acetaminophen-caffeine (EXCEDRIN MIGRAINE) 250-250-65 MG tablet Take 1 tablet by mouth every 6 (six) hours as needed for headache.   diltiazem (CARDIZEM CD) 300 MG 24 hr capsule Take 1 capsule (300 mg total) by mouth daily.   ferrous sulfate 325 (65 FE) MG tablet Take 1 tablet (325 mg total) by mouth daily with breakfast.   lisinopril (ZESTRIL) 5 MG tablet Take 1 tablet (5 mg total) by mouth daily.   pantoprazole (PROTONIX) 40 MG tablet Take 1 tablet (40 mg total) by mouth daily before breakfast.      Allergies:   Tensilon [edrophonium]  Social History   Socioeconomic History   Marital status: Divorced    Spouse name: Not on file   Number of children: 1   Years of education: 12   Highest education level: Not on file  Occupational History   Not on file  Tobacco Use   Smoking status: Every Day    Packs/day: 0.25    Years: 42.00    Pack years: 10.50    Types: Cigarettes   Smokeless tobacco: Never   Tobacco comments:    3 cigarettes per day( 07/08/20)  Vaping Use   Vaping Use: Never used  Substance and Sexual Activity   Alcohol use: Never    Alcohol/week: 0.0 standard drinks   Drug use: No   Sexual activity: Not on file  Other Topics Concern   Not on file  Social History Narrative   Not on file   Social Determinants of Health   Financial Resource Strain: Not on file  Food Insecurity: Not on file  Transportation Needs: Not on file  Physical Activity: Not on file   Stress: Not on file  Social Connections: Not on file     Family History:  The patient's family history includes Alcohol abuse in an other family member; Arthritis in some other family members; Breast cancer in an other family member; Diabetes in his sister; Heart attack in his father; Heart disease in his sister and other family members; Hypertension in an other family member; Kidney disease in an other family member; Mental illness in his sister and another family member; Stroke in his father and another family member. There is no history of Colon cancer, Colon polyps, Esophageal cancer, Rectal cancer, or Stomach cancer.  ROS:   Please see the history of present illness.  All other systems are reviewed and otherwise negative.    EKG(s)/Additional Labs   EKG:  EKG is ordered today since we need to have this in date within 30 days of TEE - shows SB 55bpm, occasional PACs, probable LAE, incompelte RBBB, LAFB, LVH with TWI inferiorly and V4-V6, similar to prior (see 12/2020)  Recent Labs: 12/09/2021: BUN 11; Creatinine, Ser 0.95; Hemoglobin 12.1; Platelets 296; Potassium 3.9; Sodium 141  Recent Lipid Panel    Component Value Date/Time   CHOL 161 12/04/2018 0859   CHOL 176 09/05/2018 1000   TRIG 53 12/04/2018 0859   HDL 43 12/04/2018 0859   HDL 54 09/05/2018 1000   CHOLHDL 3.7 12/04/2018 0859   VLDL 11 12/04/2018 0859   LDLCALC 107 (H) 12/04/2018 0859   LDLCALC 113 (H) 09/05/2018 1000    PHYSICAL EXAM:    VS:  BP 120/68   Pulse (!) 54   Ht '5\' 9"'$  (1.753 m)   Wt 137 lb (62.1 kg)   SpO2 95%   BMI 20.23 kg/m   BMI: Body mass index is 20.23 kg/m.  GEN: Well nourished, well developed male in no acute distress HEENT: normocephalic, atraumatic Neck: no JVD, carotid bruits, or masses Cardiac: RRR; + soft SEM heard best at apex, no rubs or gallops, no edema  Respiratory:  clear to auscultation bilaterally, normal work of breathing GI: soft, nontender, nondistended, + BS MS: no  deformity or atrophy Skin: warm and dry, no rash, prominent vasculature Neuro:  Alert and Oriented x 3, Strength and sensation are intact, follows commands Psych: euthymic mood, full affect  Wt Readings from Last 3 Encounters:  12/30/21 137 lb (62.1 kg)  12/09/21 139 lb 12.8 oz (63.4 kg)  12/16/20 141  lb 5 oz (64.1 kg)     ASSESSMENT & PLAN:   1. Congenital heart disease now with severe mitral regurgitation - reviewed echo result with Dr. [name]; we would recommend moving forward with TEE. Discussed echo result and recommendation for TEE with patient who is agreeable. We will arrange. We are getting updated labs below - I called endoscopy personally to ensure they'll be acceptable in date and endo said as long as they are within 2 weeks, that's fine. Also reminded patient of need for SBE ppx (has amoxicillin rx on file from last visit).  Shared Decision Making/Informed Consent The risks [esophageal damage, perforation (1:10,000 risk), bleeding, pharyngeal hematoma as well as other potential complications associated with conscious sedation including aspiration, arrhythmia, respiratory failure and death], benefits (treatment guidance and diagnostic support) and alternatives of a transesophageal echocardiogram were discussed in detail with Mr. Smoak and he is willing to proceed.   2. Essential HTN - BP now well controlled on lisinopril and diltiazem. Repeat labs planned today to ensure K, Cr stable back on ACEi.  3. H/o presumed NICM due to atrial flutter - EF 50-55% by echo (has been variable over the years - 45-50% in 01/2019, 55-60% in 04/2019, now 50-55%). No changes made in regimen today. He appears euvolemic and is maintaining NSR/SB HR 50s on diltiazem. He will continue Eliquis at this time. We did discuss the OTC Excedrin he has on his list, as I would recommend to limit this medicine since it contains ASA. We will also trend his CBC today to ensure Hgb remains stable. (Was 14.1 in 05/2021  then 12.1 at last visit.)  4. Mild nonobstructive CAD - not on ASA due to concomitant Eliquis. He has not eaten yet today so we will get CMET/lipid profile. Would consider statin therapy for goal LDL <70. Had 3V cor calcification on chest CT 02/2020 as well. No recent anginal symptoms.     Disposition: F/u with myself or Dr. [name] in 4 weeks tentatively, though further plans will depend on TEE result in case he needs to see our structural team or cardiothoracic surgery team.   Medication Adjustments/Labs and Tests Ordered: Current medicines are reviewed at length with the patient today.  Concerns regarding medicines are outlined above. Medication changes, Labs and Tests ordered today are summarized above and listed in the Patient Instructions accessible in Encounters.   Signed, Charlie Pitter, PA-C  12/30/2021 9:05 AM    [facility] Phone: [phone]; Fax: [phone]

## 2021-12-26 ENCOUNTER — Ambulatory Visit (HOSPITAL_COMMUNITY): Payer: Self-pay | Attending: Internal Medicine

## 2021-12-26 DIAGNOSIS — I2510 Atherosclerotic heart disease of native coronary artery without angina pectoris: Secondary | ICD-10-CM | POA: Insufficient documentation

## 2021-12-26 DIAGNOSIS — I4892 Unspecified atrial flutter: Secondary | ICD-10-CM | POA: Insufficient documentation

## 2021-12-26 DIAGNOSIS — D509 Iron deficiency anemia, unspecified: Secondary | ICD-10-CM | POA: Insufficient documentation

## 2021-12-26 DIAGNOSIS — I059 Rheumatic mitral valve disease, unspecified: Secondary | ICD-10-CM | POA: Insufficient documentation

## 2021-12-26 DIAGNOSIS — I428 Other cardiomyopathies: Secondary | ICD-10-CM | POA: Insufficient documentation

## 2021-12-26 DIAGNOSIS — Q249 Congenital malformation of heart, unspecified: Secondary | ICD-10-CM | POA: Insufficient documentation

## 2021-12-26 LAB — ECHOCARDIOGRAM COMPLETE
Area-P 1/2: 2.22 cm2
Calc EF: 46.1 %
MV M vel: 6.9 m/s
MV Peak grad: 190.4 mmHg
MV VTI: 1.97 cm2
Radius: 0.65 cm
S' Lateral: 3.8 cm
Single Plane A2C EF: 45.1 %
Single Plane A4C EF: 50.4 %

## 2021-12-30 ENCOUNTER — Other Ambulatory Visit: Payer: Self-pay | Admitting: *Deleted

## 2021-12-30 ENCOUNTER — Encounter: Payer: Self-pay | Admitting: Physician Assistant

## 2021-12-30 ENCOUNTER — Ambulatory Visit (INDEPENDENT_AMBULATORY_CARE_PROVIDER_SITE_OTHER): Payer: Self-pay | Admitting: Physician Assistant

## 2021-12-30 VITALS — BP 120/68 | HR 54 | Ht 69.0 in | Wt 137.0 lb

## 2021-12-30 DIAGNOSIS — I2510 Atherosclerotic heart disease of native coronary artery without angina pectoris: Secondary | ICD-10-CM

## 2021-12-30 DIAGNOSIS — I4892 Unspecified atrial flutter: Secondary | ICD-10-CM

## 2021-12-30 DIAGNOSIS — I10 Essential (primary) hypertension: Secondary | ICD-10-CM

## 2021-12-30 DIAGNOSIS — I340 Nonrheumatic mitral (valve) insufficiency: Secondary | ICD-10-CM

## 2021-12-30 DIAGNOSIS — I059 Rheumatic mitral valve disease, unspecified: Secondary | ICD-10-CM

## 2021-12-30 DIAGNOSIS — I428 Other cardiomyopathies: Secondary | ICD-10-CM

## 2021-12-30 DIAGNOSIS — E785 Hyperlipidemia, unspecified: Secondary | ICD-10-CM

## 2021-12-30 DIAGNOSIS — D509 Iron deficiency anemia, unspecified: Secondary | ICD-10-CM

## 2021-12-30 DIAGNOSIS — Q249 Congenital malformation of heart, unspecified: Secondary | ICD-10-CM

## 2021-12-30 LAB — COMPREHENSIVE METABOLIC PANEL
ALT: 10 IU/L (ref 0–44)
AST: 24 IU/L (ref 0–40)
Albumin/Globulin Ratio: 1.7 (ref 1.2–2.2)
Albumin: 4.3 g/dL (ref 3.8–4.9)
Alkaline Phosphatase: 78 IU/L (ref 44–121)
BUN/Creatinine Ratio: 9 (ref 9–20)
BUN: 9 mg/dL (ref 6–24)
Bilirubin Total: <0.2 mg/dL (ref 0.0–1.2)
CO2: 23 mmol/L (ref 20–29)
Calcium: 9.3 mg/dL (ref 8.7–10.2)
Chloride: 105 mmol/L (ref 96–106)
Creatinine, Ser: 0.95 mg/dL (ref 0.76–1.27)
Globulin, Total: 2.5 g/dL (ref 1.5–4.5)
Glucose: 95 mg/dL (ref 70–99)
Potassium: 3.8 mmol/L (ref 3.5–5.2)
Sodium: 142 mmol/L (ref 134–144)
Total Protein: 6.8 g/dL (ref 6.0–8.5)
eGFR: 94 mL/min/{1.73_m2} (ref >59)

## 2021-12-30 LAB — CBC
Hematocrit: 39.6 % (ref 37.5–51.0)
Hemoglobin: 13.1 g/dL (ref 13.0–17.7)
MCH: 32 pg (ref 26.6–33.0)
MCHC: 33.1 g/dL (ref 31.5–35.7)
MCV: 97 fL (ref 79–97)
Platelets: 264 10*3/uL (ref 150–450)
RBC: 4.09 x10E6/uL — ABNORMAL LOW (ref 4.14–5.80)
RDW: 12.2 % (ref 11.6–15.4)
WBC: 5.1 10*3/uL (ref 3.4–10.8)

## 2021-12-30 LAB — LIPID PANEL
Chol/HDL Ratio: 2.9 ratio (ref 0.0–5.0)
Cholesterol, Total: 172 mg/dL (ref 100–199)
HDL: 60 mg/dL (ref >39)
LDL Chol Calc (NIH): 99 mg/dL (ref 0–99)
Triglycerides: 68 mg/dL (ref 0–149)
VLDL Cholesterol Cal: 13 mg/dL (ref 5–40)

## 2021-12-30 NOTE — Patient Instructions (Signed)
Medication Instructions:  Your physician recommends that you continue on your current medications as directed. Please refer to the Current Medication list given to you today. *If you need a refill on your cardiac medications before your next appointment, please call your pharmacy*   Lab Work: TODAY-CMET, LIPIDS, CBC If you have labs (blood work) drawn today and your tests are completely normal, you will receive your results only by: Erin (if you have MyChart) OR A paper copy in the mail If you have any lab test that is abnormal or we need to change your treatment, we will call you to review the results.   Testing/Procedures: Your physician has requested that you have a TEE. During a TEE, sound waves are used to create images of your heart. It provides your doctor with information about the size and shape of your heart and how well your heart's chambers and valves are working. In this test, a transducer is attached to the end of a flexible tube that's guided down your throat and into your esophagus (the tube leading from you mouth to your stomach) to get a more detailed image of your heart. You are not awake for the procedure. Please see the instruction sheet given to you today. For further information please visit [email or web address]. SEE INSTRUCTIONS BELOW   Follow-Up: At [facility], you and your health needs are our priority.  As part of our continuing mission to provide you with exceptional heart care, we have created designated Provider Care Teams.  These Care Teams include your primary Cardiologist (physician) and Advanced Practice Providers (APPs -  Physician Assistants and Nurse Practitioners) who all work together to provide you with the care you need, when you need it.  We recommend signing up for the patient portal called "MyChart".  Sign up information is provided on this After Visit Summary.  MyChart is used to connect with patients for Virtual Visits (Telemedicine).   Patients are able to view lab/test results, encounter notes, upcoming appointments, etc.  Non-urgent messages can be sent to your provider as well.   To learn more about what you can do with MyChart, go to [email or web address].    Your next appointment:   4 week(s)  The format for your next appointment:   In Person  Provider:   [name], MD or Melina Copa, [place]    Other Instructions  TEE INSTRUCTIONS   Dear Terry Golden,   You are scheduled for a TEE.  TEE on Thursday January 08, 2022 with Dr. Gwyndolyn Kaufman.  Please arrive at the [facility] (Main Entrance A) at [facility]: [address] [place], [place] [address] at 1pm. (1 hour prior to procedure unless lab work is needed; if lab work is needed arrive 1.5 hours ahead)   DIET: Nothing to eat or drink after midnight except a sip of water with medications (see medication instructions below)   Medication Instructions: No medication to hold  Continue your anticoagulant: Eliquis You will need to continue your anticoagulant after your procedure until you  are told by your  Provider that it is safe to stop   Labs:  No additional labs are needed.  You must have a responsible person to drive you home and stay in the waiting area during your procedure. Failure to do so could result in cancellation.  Bring your insurance cards.  *Special Note: Every effort is made to have your procedure done on time. Occasionally there are emergencies that occur at the hospital that  may cause delays. Please be patient if a delay does occur.    Important Information About Sugar

## 2021-12-31 NOTE — H&P (View-Only) (Signed)
Please let patient know pre TEE labs are all stable. Blood count looks good. Cholesterol is somewhat higher than we'd like it to be given that he had coronary calcification seen on prior CT. Would start rosuvastatin '10mg'$  daily if he is agreeable to try to get LDL less than 70 with follow-up LFT/lipids in 8 weeks.

## 2021-12-31 NOTE — Progress Notes (Signed)
Please let patient know pre TEE labs are all stable. Blood count looks good. Cholesterol is somewhat higher than we'd like it to be given that he had coronary calcification seen on prior CT. Would start rosuvastatin '10mg'$  daily if he is agreeable to try to get LDL less than 70 with follow-up LFT/lipids in 8 weeks.

## 2022-01-01 ENCOUNTER — Other Ambulatory Visit: Payer: Self-pay

## 2022-01-01 ENCOUNTER — Telehealth: Payer: Self-pay | Admitting: Interventional Cardiology

## 2022-01-01 ENCOUNTER — Encounter (HOSPITAL_COMMUNITY): Payer: Self-pay | Admitting: Cardiology

## 2022-01-01 DIAGNOSIS — E785 Hyperlipidemia, unspecified: Secondary | ICD-10-CM

## 2022-01-01 MED ORDER — ROSUVASTATIN CALCIUM 10 MG PO TABS: 10.0000 mg | ORAL_TABLET | Freq: Every day | ORAL | 1 refills | Status: DC

## 2022-01-01 NOTE — Telephone Encounter (Signed)
Pt returning call. Transferred to Ulice Brilliant, [place]

## 2022-01-01 NOTE — Telephone Encounter (Signed)
Spoke with the patient and gave him lab results. He voiced understanding.

## 2022-01-08 ENCOUNTER — Other Ambulatory Visit: Payer: Self-pay

## 2022-01-08 ENCOUNTER — Ambulatory Visit (HOSPITAL_BASED_OUTPATIENT_CLINIC_OR_DEPARTMENT_OTHER): Payer: Self-pay | Admitting: Anesthesiology

## 2022-01-08 ENCOUNTER — Ambulatory Visit (HOSPITAL_COMMUNITY): Payer: Self-pay | Admitting: Anesthesiology

## 2022-01-08 ENCOUNTER — Encounter (HOSPITAL_COMMUNITY)
Admission: RE | Disposition: A | Discharge status: Home / Self Care | Payer: Self-pay | Source: Home / Self Care | Attending: Cardiology

## 2022-01-08 ENCOUNTER — Ambulatory Visit (HOSPITAL_BASED_OUTPATIENT_CLINIC_OR_DEPARTMENT_OTHER): Payer: Self-pay

## 2022-01-08 ENCOUNTER — Ambulatory Visit (HOSPITAL_COMMUNITY)
Admission: RE | Admit: 2022-01-08 | Discharge: 2022-01-08 | Disposition: A | Discharge status: Home / Self Care | Payer: Self-pay | Attending: Cardiology | Admitting: Cardiology

## 2022-01-08 ENCOUNTER — Encounter (HOSPITAL_COMMUNITY): Payer: Self-pay | Admitting: Cardiology

## 2022-01-08 DIAGNOSIS — Z8774 Personal history of (corrected) congenital malformations of heart and circulatory system: Secondary | ICD-10-CM | POA: Insufficient documentation

## 2022-01-08 DIAGNOSIS — I081 Rheumatic disorders of both mitral and tricuspid valves: Secondary | ICD-10-CM

## 2022-01-08 DIAGNOSIS — I509 Heart failure, unspecified: Secondary | ICD-10-CM

## 2022-01-08 DIAGNOSIS — I340 Nonrheumatic mitral (valve) insufficiency: Secondary | ICD-10-CM

## 2022-01-08 DIAGNOSIS — F17210 Nicotine dependence, cigarettes, uncomplicated: Secondary | ICD-10-CM

## 2022-01-08 DIAGNOSIS — I110 Hypertensive heart disease with heart failure: Secondary | ICD-10-CM

## 2022-01-08 DIAGNOSIS — I4891 Unspecified atrial fibrillation: Secondary | ICD-10-CM

## 2022-01-08 HISTORY — PX: TEE WITHOUT CARDIOVERSION: SHX5443

## 2022-01-08 LAB — ECHO TEE
MV M vel: 6.48 m/s
MV Peak grad: 168 mmHg
Radius: 0.7 cm

## 2022-01-08 SURGERY — ECHOCARDIOGRAM, TRANSESOPHAGEAL
Anesthesia: Monitor Anesthesia Care

## 2022-01-08 MED ORDER — PROPOFOL 10 MG/ML IV BOLUS
INTRAVENOUS | Status: DC | PRN
  Administered 2022-01-08 (×3): 20 mg via INTRAVENOUS
  Administered 2022-01-08: 10 mg via INTRAVENOUS

## 2022-01-08 MED ORDER — EPHEDRINE SULFATE (PRESSORS) 50 MG/ML IJ SOLN
INTRAMUSCULAR | Status: DC | PRN
  Administered 2022-01-08: 5 mg via INTRAVENOUS
  Administered 2022-01-08: 2.5 mg via INTRAVENOUS

## 2022-01-08 MED ORDER — PROPOFOL 500 MG/50ML IV EMUL
INTRAVENOUS | Status: DC | PRN
  Administered 2022-01-08: 150 ug/kg/min via INTRAVENOUS

## 2022-01-08 MED ORDER — GLYCOPYRROLATE PF 0.2 MG/ML IJ SOSY
PREFILLED_SYRINGE | INTRAMUSCULAR | Status: DC | PRN
  Administered 2022-01-08 (×2): .1 mg via INTRAVENOUS

## 2022-01-08 MED ORDER — PHENYLEPHRINE 80 MCG/ML (10ML) SYRINGE FOR IV PUSH (FOR BLOOD PRESSURE SUPPORT)
PREFILLED_SYRINGE | INTRAVENOUS | Status: DC | PRN
  Administered 2022-01-08: 160 ug via INTRAVENOUS
  Administered 2022-01-08: 80 ug via INTRAVENOUS

## 2022-01-08 MED ORDER — SODIUM CHLORIDE 0.9 % IV SOLN: INTRAVENOUS | Status: DC

## 2022-01-08 NOTE — Discharge Instructions (Signed)

## 2022-01-08 NOTE — Progress Notes (Signed)
  Echocardiogram Echocardiogram Transesophageal has been performed.  Bobbye Charleston 01/08/2022, 2:58 PM

## 2022-01-08 NOTE — Transfer of Care (Signed)
Immediate Anesthesia Transfer of Care Note  Patient: Terry Golden.  Procedure(s) Performed: TRANSESOPHAGEAL ECHOCARDIOGRAM (TEE)  Patient Location: Endoscopy Unit  Anesthesia Type:MAC  Level of Consciousness: awake and drowsy  Airway & Oxygen Therapy: Patient Spontanous Breathing and Patient connected to nasal cannula oxygen  Post-op Assessment: Report given to RN and Post -op Vital signs reviewed and stable  Post vital signs: Reviewed and stable  Last Vitals:  Vitals Value Taken Time  BP 109/64 01/08/22 1443  Temp    Pulse 66 01/08/22 1444  Resp 19 01/08/22 1444  SpO2 96 % 01/08/22 1444  Vitals shown include unvalidated device data.  Last Pain:  Vitals:   01/08/22 1158  TempSrc: Oral  PainSc: 0-No pain         Complications: No notable events documented.

## 2022-01-08 NOTE — Anesthesia Postprocedure Evaluation (Signed)
Anesthesia Post Note  Patient: Terry Golden.  Procedure(s) Performed: TRANSESOPHAGEAL ECHOCARDIOGRAM (TEE)     Patient location during evaluation: Endoscopy Anesthesia Type: MAC Level of consciousness: awake and alert Pain management: pain level controlled Vital Signs Assessment: post-procedure vital signs reviewed and stable Respiratory status: spontaneous breathing and respiratory function stable Cardiovascular status: stable Postop Assessment: no apparent nausea or vomiting Anesthetic complications: no   No notable events documented.  Last Vitals:  Vitals:   01/08/22 1455 01/08/22 1505  BP: 136/71 136/76  Pulse: (!) 59 61  Resp: 17 17  Temp:    SpO2: 99% 99%    Last Pain:  Vitals:   01/08/22 1505  TempSrc:   PainSc: 0-No pain                 Aylee Littrell DANIEL

## 2022-01-08 NOTE — CV Procedure (Addendum)
     Transesophageal Echocardiogram Note  Terry Golden [id number] 08/30/1965  Procedure: Transesophageal Echocardiogram Indications: MV clef, s/p ASD and VSD repair, concern for severe MR  Procedure Details Consent: Obtained Time Out: Verified patient identification, verified procedure, site/side was marked, verified correct patient position, special equipment/implants available, Radiology Safety Procedures followed,  medications/allergies/relevent history reviewed, required imaging and test results available.  Performed  Medications: Propofol '589mg'$   Left Ventrical:  LVEF 55-60%. No residual VSD visualized.  Mitral Valve: Patient is s/p repair of MV cleft. The valve appear thickened and restricted. There is one predominant papillary muscle to which all mitral valve chords appear to be attached consistent with possible parachute mitral valve. There is mild mitral stenosis with mean gradient 82mHg at HR 60. By color flow and PISA, there appears to be moderate mitral regurgitation however there is clear systolic reversal in the pulmonary veins on surface echo. Recommend CMR to further evaluate given discrepant findings.  Aortic Valve: Tricuspid. No AR or AS  Tricuspid Valve: Based on gastric views, the valve appears to only have two leaflets, one of which is overriding. There is mild tricuspid regurgitation. Likely related to primum ASD repair.  Pulmonic Valve: Normal appearing. No PI.   Left Atrium/ Left atrial appendage: Severely dilated, no LAA thrombus  Atrial septum: S/p primum ASD repair with no residual shunting  Aorta: Normal structure   Complications: No apparent complications Patient did tolerate procedure well.  HFreada Bergeron MD 01/08/2022, 2:48 PM

## 2022-01-08 NOTE — Anesthesia Preprocedure Evaluation (Addendum)
Anesthesia Evaluation  Patient identified by MRN, date of birth, ID band Patient awake    Reviewed: Allergy & Precautions, NPO status , Patient's Chart, lab work & pertinent test results  History of Anesthesia Complications Negative for: history of anesthetic complications  Airway Mallampati: I  TM Distance: >3 FB Neck ROM: Full    Dental  (+) Dental Advisory Given, Edentulous Upper, Edentulous Lower   Pulmonary neg pulmonary ROS, Current Smoker and Patient abstained from smoking.,    Pulmonary exam normal        Cardiovascular hypertension, Pt. on medications +CHF  + dysrhythmias Atrial Fibrillation + Valvular Problems/Murmurs (h/o congenital heart disease, AV canal defect, ostium primum ASD, VSD, cleft mitral valve s/p repair 2000) MR  Rhythm:Regular Rate:Normal + Systolic murmurs Congenital Heart Disease S/P Mitral repair 2020  TTE 01/2022 IMPRESSIONS    1. 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 There is  severe MV regurgitation that is central. The leaflets are thickened with  restricted movement in systole/diastole possibly related to rheumatic  heart disease. Only 1 prominent papillary  muscle is seen so parachute-like mitral valve is also a possibility given  congenital heart history. MG 3 mmHG @ 55 bpm. There is systolic flow  reversal in the right inferior PV. Would recommend a TEE for  calrification. The mitral valve is abnormal.  Severe mitral valve regurgitation. Mild mitral stenosis. There is a  commisuroplasty present in the mitral position. Procedure Date: 03/25/1999.  2. Left ventricular ejection fraction, by estimation, is 50 to 55%. The  left ventricle has low normal function. The left ventricle has no regional  wall motion abnormalities. There is mild concentric left ventricular  hypertrophy. Left ventricular  diastolic function could not be evaluated.  3. Right ventricular systolic  function is moderately reduced. The right  ventricular size is mildly enlarged. Tricuspid regurgitation signal is  inadequate for assessing PA pressure.  4. Left atrial size was severely dilated.  5. History of Primum ASD repair.  6. Right atrial size was mildly dilated.  7. The aortic valve is tricuspid. Aortic valve regurgitation is not  visualized. No aortic stenosis is present.  8. The inferior vena cava is normal in size with greater than 50%  respiratory variability, suggesting right atrial pressure of 3 mmHg.   Comparison(s): Changes from prior study are noted. MR is severe. EF ~50%.      Neuro/Psych negative neurological ROS  negative psych ROS   GI/Hepatic negative GI ROS, Neg liver ROS,   Endo/Other  negative endocrine ROS  Renal/GU negative Renal ROS  negative genitourinary   Musculoskeletal negative musculoskeletal ROS (+)   Abdominal   Peds  Hematology  (+) Blood dyscrasia (on eliquis), anemia ,   Anesthesia Other Findings   Reproductive/Obstetrics                           Anesthesia Physical  Anesthesia Plan  ASA: 3  Anesthesia Plan: MAC   Post-op Pain Management: Minimal or no pain anticipated   Induction: Intravenous  PONV Risk Score and Plan: Propofol infusion and Treatment may vary due to age or medical condition  Airway Management Planned: Natural Airway and Mask  Additional Equipment: None  Intra-op Plan:   Post-operative Plan:   Informed Consent: I have reviewed the patients History and Physical, chart, labs and discussed the procedure including the risks, benefits and alternatives for the proposed anesthesia with the patient or authorized representative  who has indicated his/her understanding and acceptance.     Dental advisory given  Plan Discussed with: Anesthesiologist and CRNA  Anesthesia Plan Comments: (EF 50%)      Anesthesia Quick Evaluation

## 2022-01-09 ENCOUNTER — Telehealth: Payer: Self-pay | Admitting: *Deleted

## 2022-01-09 ENCOUNTER — Encounter: Payer: Self-pay | Admitting: *Deleted

## 2022-01-09 DIAGNOSIS — I059 Rheumatic mitral valve disease, unspecified: Secondary | ICD-10-CM

## 2022-01-09 NOTE — Telephone Encounter (Signed)
-----   Message from Loel Dubonnet, NP sent at 01/09/2022 10:10 AM EDT ----- TEE showed mild stiffening with moderate leaking of mitral valve. 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 The leaking of the mitral valve may actually be worse than what was measured during the procedure because you were sedated at that time. Heart pumping function was normal.  VSD/ASD repair was functioning appropriately.   Recommend cardiac MRI for further imaging of MV. Follow up as scheduled with Dayna in July

## 2022-01-09 NOTE — Telephone Encounter (Signed)
Patient viewed result comments in my chart.  I spoke with patient and reviewed results/recommendations with him. He would like to proceed with cardiac MRI.  I verbally went over instructions with him and will send to him through my chart

## 2022-01-09 NOTE — Interval H&P Note (Signed)
History and Physical Interval Note:  01/09/2022 8:24 AM  Terry Golden.  has presented today for surgery, with the diagnosis of severe mitrial regerg.  The various methods of treatment have been discussed with the patient and family. After consideration of risks, benefits and other options for treatment, the patient has consented to  Procedure(s): TRANSESOPHAGEAL ECHOCARDIOGRAM (TEE) (N/A) as a surgical intervention.  The patient's history has been reviewed, patient examined, no change in status, stable for surgery.  I have reviewed the patient's chart and labs.  Questions were answered to the patient's satisfaction.     Freada Bergeron

## 2022-01-13 NOTE — Interval H&P Note (Signed)
History and Physical Interval Note:  01/13/2022 11:48 AM  Terry Golden.  has presented today for surgery, with the diagnosis of severe mitrial regerg.  The various methods of treatment have been discussed with the patient and family. After consideration of risks, benefits and other options for treatment, the patient has consented to  Procedure(s): TRANSESOPHAGEAL ECHOCARDIOGRAM (TEE) (N/A) as a surgical intervention.  The patient's history has been reviewed, patient examined, no change in status, stable for surgery.  I have reviewed the patient's chart and labs.  Questions were answered to the patient's satisfaction.     Freada Bergeron

## 2022-02-03 NOTE — Progress Notes (Unsigned)
Cardiology Office Note    Date:  02/04/2022   ID:  Terry Golden., DOB 11/21/1965, MRN [id number]  PCP:  [name], NP  Cardiologist:  [name], MD  Electrophysiologist:  [name], MD   Chief Complaint: discuss TEE results  History of Present Illness:   Terry Golden. is a 56 y.o. 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   History obtained from prior notes with congenital heart disease as above. Prior cardiac cath 2017 showed minor 20% LAD, nonobstructive CAD. He was admitted 12/2018 with atrial flutter and RVR. EF was 40% and felt to be tachymediated. He was seen by Dr. [name] who recommended continuing diltiazem and he ultimately underwent TEE/DCCV 12/2018. Subsequent echos showed normal LVEF. He had an admission in 12/2020 for severe IDA with Hgb 3.9 requiring multiple transfusions in the context of Covid; FOBT negative. EGD showed gastritis and he was treated with Protonix and iron. His Hgb has since normalized. When I saw him recently 12/09/21 he was doing well except had been out of his diltiazem and lisinopril for several weeks. These were restarted with plan for repeat surveillance echo as well. Echo was performed 12/26/21 showing now severe MR that is central (previously mild in 2020), with restricted movement in systole/diastole - only 1 prominent papillary muscle was seen so parachute-like mitral valve was also a possibility given congenital heart history. There was also mild MS, EF 50-55%, mild LVH, moderately reduced RVF, mild RVE, severely dilated LA, & mildly dilated  RA. Per discussion with Dr. [name], TEE was pursued with complex findings as outlined:  1. History of primum ASD with MV cleft s/p repair. The mitral valve leaflets appear thickened and restricted. There is mild mitral stenosis with mean gradient 62mHg at HR 60bpm. There is a single papillary muscle visualized to which all mitral valve  chords appear to be attached. 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 There is moderate mitral regurgitation by PISA with EROA 0.24cm2, RVol 525m 3D VC 0.3cm. There is systolic blunting in the LUPV. During interrogation of the RUPV and RLPV the  patient appeared to enter a junctional rhythm with a short atrial reversal wave without true systolic flow reversal visualized (although systolic flow reversal was clearly seen on surface echo). Given discrepant values and possible underestimation of MR  on current study (may be related to lower blood pressures in the setting of sedation), recommend CMR for further evaluation. Could also consider RHC to assess PA pressures.   2. Left ventricular ejection fraction, by estimation, is 50 to 55%. The left ventricle has low normal function.   3. Patient is s/p VSD repair with no residual shunting visualized by color doppler.   4. Right ventricular systolic function is moderately reduced. The right ventricular size is mildly enlarged.   5. Left atrial size was severely dilated. No left atrial/left atrial  appendage thrombus was detected.   6. Right atrial size was severely dilated.   7. The patient is s/p primum ASD repair. There appears to be two tricuspid leaflets present following repair. One of the leaflets is elongated and  overriding compared to the other. There is mild tricuspid regurgitation. There is no tricuspid stenosis.  The tricuspid valve is abnormal.   8. The aortic valve is tricuspid. Aortic valve regurgitation is not  visualized. No aortic stenosis is present.   9. Patient is s/p primum ASD repair. No  residual shunting detected by color flow doppler.   Cardiac MRI was recommended for further evaluation. This is scheduled for next month. We also started rosuvastatin in 12/2021 for goal LDL <70. He is seen for follow-up today overall feeling well and in great spirits. He does report overall minor lightheadedness periodically but no syncope. He said this does somewhat remind him of how things felt before he required his previous valve surgery, however. This historically tends to be worse in hot weather so he aims to avoid the heat and stay hydrated. He feels mild DOE with higher levels of activity but overall reports he is doing well without any major change from previous visits. No CP. No fevers, chills or weight loss. HR running mid 40s today.  Labwork independently reviewed: 12/2021 Hgb 13.1, plt wnl, K 3.8, Cr 0.95, LFTs wnl, LDL 99 12/2021 Hgb 12.1, plt wnl, K 3.9, Cr 0.95 05/2021 [facility] Hgb 14.1, plt 240 01/2021 LDL 109 12/2020 Mg 2.1, K 4.0, Cr 1.11 - prior hyperkalemia noted   Cardiology Studies:   Studies reviewed are outlined and summarized above. Reports included below if pertinent.   2d echo 12/26/21   1. 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 There is  severe MV regurgitation that is central. The leaflets are thickened with  restricted movement in systole/diastole possibly related to rheumatic  heart disease. Only 1 prominent papillary  muscle is seen so parachute-like mitral valve is also a possibility given  congenital heart history. MG 3 mmHG @ 55 bpm. There is systolic flow  reversal in the right inferior PV. Would recommend a TEE for  calrification. The mitral valve is abnormal.  Severe mitral valve regurgitation. Mild mitral stenosis. There is a  commisuroplasty present in the mitral position. Procedure Date: 03/25/1999.   2. Left ventricular ejection fraction, by estimation, is 50 to 55%. The  left ventricle has low normal function. The left ventricle has no  regional  wall motion abnormalities. There is mild concentric left ventricular  hypertrophy. Left ventricular  diastolic function could not be evaluated.   3. Right ventricular systolic function is moderately reduced. The right  ventricular size is mildly enlarged. Tricuspid regurgitation signal is  inadequate for assessing PA pressure.   4. Left atrial size was severely dilated.   5. History of Primum ASD repair.   6. Right atrial size was mildly dilated.   7. The aortic valve is tricuspid. Aortic valve regurgitation is not  visualized. No aortic stenosis is present.   8. The inferior vena cava is normal in size with greater than 50%  respiratory variability, suggesting right atrial pressure of 3 mmHg.   Comparison(s): Changes from prior study are noted. MR is severe. EF ~50%.    IMPRESSIONS     1. History of primum ASD with MV cleft s/p repair. The mitral valve  leaflets appear thickened and restricted. There is mild mitral stenosis  with mean gradient 25mHg at HR 60bpm. There is a single papillary muscle  visualized to which all mitral valve  chords appear to be attached. 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 There is moderate mitral regurgitation by PISA with EROA 0.24cm2,  RVol 92m. 3D VC 0.3cm. There is systolic blunting in the LUPV. During  interrogation of the RUPV and RLPV the   patient appeared to enter a junctional rhythm with a short atrial  reversal wave without true systolic flow reversal visualized (although  systolic flow reversal was clearly seen on surface echo). Given discrepant  values and possible underestimation of MR   on current study (may be related to lower blood pressures in the setting  of sedation), recommend CMR for further evaluation. Could also consider  RHC to assess PA pressures.   2. Left ventricular ejection fraction, by estimation, is 50 to 55%. The  left ventricle has low normal function.   3. Patient is s/p VSD repair with no  residual shunting visualized by  color doppler.   4. Right ventricular systolic function is moderately reduced. The right  ventricular size is mildly enlarged.   5. Left atrial size was severely dilated. No left atrial/left atrial  appendage thrombus was detected.   6. Right atrial size was severely dilated.   7. The patient is s/p primum ASD repair. There appears to be two  tricuspid leaflets present following repair. One of the leaflets is  elongated and overriding compared to the other. There is mild tricuspid  regurgitation. There is no tricuspid stenosis.  The tricuspid valve is abnormal.   8. The aortic valve is tricuspid. Aortic valve regurgitation is not  visualized. No aortic stenosis is present.   9. Patient is s/p primum ASD repair. No residual shunting detected by  color flow doppler.     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   right   Mild CAD    Mitral valve disease 03/25/1999   s/p mitral valve repair with commissuroplasty    [place]    EF 40% in 2020, recovered   Rheumatic fever    Syncope    1999   [place]    VSD (ventricular septal defect)    small    Past Surgical History:  Procedure Laterality Date   ASD AND VSD REPAIR     BIOPSY  12/16/2020   Procedure: BIOPSY;  Surgeon: GGatha Mayer MD;  [facility];  Service: Gastroenterology;;   CARDIAC CATHETERIZATION N/A 09/20/2015   Procedure: Right/Left Heart Cath and Coronary Angiography;  Surgeon: JJettie Booze MD;  [facility];  Service: Cardiovascular;  Laterality: N/A;   CARDIAC SURGERY     s/p partial AVSD repair and cleft mitral valve repair with commissuroplasty   CARDIOVERSION N/A 12/07/2018   Procedure: CARDIOVERSION;  Surgeon: NDorothy Spark MD;   [facility];  Service: Cardiovascular;  Laterality: N/A;   COLONOSCOPY  07/08/2020   ESOPHAGOGASTRODUODENOSCOPY (EGD) WITH PROPOFOL N/A 12/16/2020   Procedure: ESOPHAGOGASTRODUODENOSCOPY (EGD) WITH PROPOFOL;  Surgeon: GGatha Mayer MD;  [facility]: [facility];  Service: Gastroenterology;  Laterality: N/A;   LIPOMA EXCISION Right 06/19/2020   Procedure: EXCISION SUBCUTANEOUS LIPOMA RIGHT SHOULDER;  Surgeon: TDonnie Mesa MD;  [facility] [facility]  Service: General;  Laterality: Right;   MITRAL VALVE REPAIR     TEE WITHOUT CARDIOVERSION N/A 12/07/2018   Procedure: TRANSESOPHAGEAL ECHOCARDIOGRAM (TEE);  Surgeon: NDorothy Spark MD;  [place]  Service: Cardiovascular;  Laterality: N/A;  TEE WITHOUT CARDIOVERSION N/A 01/08/2022   Procedure: TRANSESOPHAGEAL ECHOCARDIOGRAM (TEE);  Surgeon: Freada Bergeron, MD;  [facility] END[facility];  Service: Cardiovascular;  Laterality: N/A;    Current Medications: Current Meds  Medication Sig   amoxicillin (AMOXIL) 500 MG capsule Take 4 capsules by mouth 30-60 minutes prior to dental work   apixaban (ELIQUIS) 5 MG TABS tablet Take 1 tablet (5 mg total) by mouth 2 (two) times daily.   aspirin-acetaminophen-caffeine (EXCEDRIN MIGRAINE) 250-250-65 MG tablet Take 1 tablet by mouth every 6 (six) hours as needed for headache.   diltiazem (CARDIZEM CD) 300 MG 24 hr capsule Take 1 capsule (300 mg total) by mouth daily.   ferrous sulfate 325 (65 FE) MG tablet Take 1 tablet (325 mg total) by mouth daily with breakfast.   lisinopril (ZESTRIL) 5 MG tablet Take 1 tablet (5 mg total) by mouth daily.   pantoprazole (PROTONIX) 40 MG tablet Take 1 tablet (40 mg total) by mouth daily before breakfast.   rosuvastatin (CRESTOR) 10 MG tablet Take 1 tablet (10 mg total) by mouth daily.      Allergies:   Tensilon [edrophonium]   Social History   Socioeconomic History   Marital status: Divorced    Spouse name: Not on file   Number of children:  1   Years of education: 12   Highest education level: Not on file  Occupational History   Not on file  Tobacco Use   Smoking status: Every Day    Packs/day: 0.25    Years: 42.00    Total pack years: 10.50    Types: Cigarettes   Smokeless tobacco: Never   Tobacco comments:    3 cigarettes per day( 07/08/20)  Vaping Use   Vaping Use: Never used  Substance and Sexual Activity   Alcohol use: Never    Alcohol/week: 0.0 standard drinks of alcohol   Drug use: No   Sexual activity: Not on file  Other Topics Concern   Not on file  Social History Narrative   Not on file   Social Determinants of Health   Financial Resource Strain: Not on file  Food Insecurity: Not on file  Transportation Needs: Not on file  Physical Activity: Not on file  Stress: Not on file  Social Connections: Not on file     Family History:  The patient's family history includes Alcohol abuse in an other family member; Arthritis in some other family members; Breast cancer in an other family member; Diabetes in his sister; Heart attack in his father; Heart disease in his sister and other family members; Hypertension in an other family member; Kidney disease in an other family member; Mental illness in his sister and another family member; Stroke in his father and another family member. There is no history of Colon cancer, Colon polyps, Esophageal cancer, Rectal cancer, or Stomach cancer.  ROS:   Please see the history of present illness.  All other systems are reviewed and otherwise negative.    EKG(s)/Additional Labs   EKG:  EKG is ordered today, personally reviewed, demonstrating SB 46bpm, nonspecific STTW changes similar to prior  Recent Labs: 12/30/2021: ALT 10; BUN 9; Creatinine, Ser 0.95; Hemoglobin 13.1; Platelets 264; Potassium 3.8; Sodium 142  Recent Lipid Panel    Component Value Date/Time   CHOL 172 12/30/2021 0000   TRIG 68 12/30/2021 0000   HDL 60 12/30/2021 0000   CHOLHDL 2.9 12/30/2021 0000    CHOLHDL 3.7 12/04/2018 0859   VLDL 11 12/04/2018 0859  [place] 99 12/30/2021 0000    PHYSICAL EXAM:    VS:  BP (!) 144/72   Pulse (!) 46   Ht '5\' 9"'$  (1.753 m)   Wt 139 lb 12.8 oz (63.4 kg)   SpO2 94%   BMI 20.64 kg/m   BMI: Body mass index is 20.64 kg/m.  GEN: Well nourished, well developed male in no acute distress HEENT: normocephalic, atraumatic Neck: no JVD, carotid bruits, or masses Cardiac: RRR; soft SEM, no rubs or gallops, no edema  Respiratory:  clear to auscultation bilaterally, normal work of breathing GI: soft, nontender, nondistended, + BS MS: no deformity or atrophy Skin: warm and dry, no rash Neuro:  Alert and Oriented x 3, Strength and sensation are intact, follows commands Psych: euthymic mood, full affect  Wt Readings from Last 3 Encounters:  02/04/22 139 lb 12.8 oz (63.4 kg)  01/08/22 140 lb (63.5 kg)  12/30/21 137 lb (62.1 kg)     ASSESSMENT & PLAN:   1. Congenital heart disease with mitral valve disease - recent history as outlined above. He reports some mild lightheadedness and DOE though does not feel this has significantly changed recently. Appears euvolemic. Cardiac MRI is pending. TEE suggested possibility of RHC as well - will request that his next follow-up be with his primary cardiologist to review all imaging studies upon completion to review next steps. He also has SBE ppx available to him as previously discussed.  2. Essential HTN - Suboptimal blood pressure control noted today. We need a better idea of what it's running at home. The patient was provided instructions on monitoring blood pressure and HR at home for 1 week and relaying results to our office.  3. 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 Appears euvolemic on exam. We also discussed avoiding Excedrin on his list as it contains ASA. Cardiac MRI planned as above.  4. H/o  atrial flutter here with sinus bradycardia - maintaining NSR though somewhat bradycardic today in the mid 40s. Took his medicine around 7am. Will reduce diltiazem to '240mg'$  daily and request he follow HR/BP as above. He is largely feeling well. Will update BMET/TSH today given the decline in HR, and also trend CBC given his history of anemia last year.  5. Mild nonobstructive CAD - not on ASA due to concomitant Eliquis. Statin added recently, has f/u fasting labs planned later this month.  The patient requests short term refills of his Eliquis, lisinopril, rosuvastatin and changed [facility]iazem to [facility] with subsequent refills sent to mail order - discussed with CMA to send in.     Disposition: F/u with Dr. [name] (recommend MD visit given congenital heart disease) after cMRI.   Medication Adjustments/Labs and Tests Ordered: Current medicines are reviewed at length with the patient today.  Concerns regarding medicines are outlined above. Medication changes, Labs and Tests ordered today are summarized above and listed in the Patient Instructions accessible in Encounters.   Signed, Charlie Pitter, PA-C  02/04/2022 8:05 AM    [place] Phone: [phone]; Fax: [phone]

## 2022-02-04 ENCOUNTER — Encounter: Payer: Self-pay | Admitting: Physician Assistant

## 2022-02-04 ENCOUNTER — Ambulatory Visit (INDEPENDENT_AMBULATORY_CARE_PROVIDER_SITE_OTHER): Payer: Self-pay | Admitting: Physician Assistant

## 2022-02-04 VITALS — BP 140/78 | HR 46 | Ht 69.0 in | Wt 139.8 lb

## 2022-02-04 DIAGNOSIS — R001 Bradycardia, unspecified: Secondary | ICD-10-CM

## 2022-02-04 DIAGNOSIS — I4892 Unspecified atrial flutter: Secondary | ICD-10-CM

## 2022-02-04 DIAGNOSIS — I428 Other cardiomyopathies: Secondary | ICD-10-CM

## 2022-02-04 DIAGNOSIS — I2510 Atherosclerotic heart disease of native coronary artery without angina pectoris: Secondary | ICD-10-CM

## 2022-02-04 DIAGNOSIS — I059 Rheumatic mitral valve disease, unspecified: Secondary | ICD-10-CM

## 2022-02-04 DIAGNOSIS — Q249 Congenital malformation of heart, unspecified: Secondary | ICD-10-CM

## 2022-02-04 MED ORDER — ROSUVASTATIN CALCIUM 10 MG PO TABS: 10.0000 mg | ORAL_TABLET | Freq: Every day | ORAL | 1 refills | Status: DC

## 2022-02-04 MED ORDER — APIXABAN 5 MG PO TABS: 5.0000 mg | ORAL_TABLET | Freq: Two times a day (BID) | ORAL | 0 refills | Status: DC

## 2022-02-04 MED ORDER — DILTIAZEM HCL ER COATED BEADS 240 MG PO CP24: 240.0000 mg | ORAL_CAPSULE | Freq: Every day | ORAL | 1 refills | Status: DC

## 2022-02-04 MED ORDER — LISINOPRIL 5 MG PO TABS: 5.0000 mg | ORAL_TABLET | Freq: Every day | ORAL | 1 refills | Status: DC

## 2022-02-04 NOTE — Patient Instructions (Addendum)
Medication Instructions:  Change Diltiazem to '240mg'$  take 1 tablet once a day  *If you need a refill on your cardiac medications before your next appointment, please call your pharmacy*   Lab Work: TODAY-BMET, CBC & TSH If you have labs (blood work) drawn today and your tests are completely normal, you will receive your results only by: [place] (if you have MyChart) OR A paper copy in the mail If you have any lab test that is abnormal or we need to change your treatment, we will call you to review the results.   Testing/Procedures: NONE ORDERED   Follow-Up: At [facility], you and your health needs are our priority.  As part of our continuing mission to provide you with exceptional heart care, we have created designated Provider Care Teams.  These Care Teams include your primary Cardiologist (physician) and Advanced Practice Providers (APPs -  Physician Assistants and Nurse Practitioners) who all work together to provide you with the care you need, when you need it.  We recommend signing up for the patient portal called "MyChart".  Sign up information is provided on this After Visit Summary.  MyChart is used to connect with patients for Virtual Visits (Telemedicine).  Patients are able to view lab/test results, encounter notes, upcoming appointments, etc.  Non-urgent messages can be sent to your provider as well.   To learn more about what you can do with MyChart, go to [email or web address].    Your next appointment:   AFTER CARDIAC MRI     The format for your next appointment:   In Person  Provider:   [name], MD ONLY    Other Instructions We need to get a better idea of what your blood pressure and heart rate are running at home. Here are some instructions to follow: - I would recommend using a blood pressure cuff that goes on your arm. The wrist ones can be inaccurate. If you're purchasing one for the first time, try to select one that also reports your  heart rate because this can be helpful information as well. - To check your blood pressure, choose a time at least 3 hours after taking your blood pressure medicines. If you can sample it at different times of the day, that's great - it might give you more information about how your blood pressure fluctuates. Remain seated in a chair for 5 minutes quietly beforehand, then check it.  - Please record a list of those readings and call us/send in MyChart message with them for our review in 1 week.  Important Information About Sugar

## 2022-02-05 LAB — BASIC METABOLIC PANEL
BUN/Creatinine Ratio: 15 (ref 9–20)
BUN: 14 mg/dL (ref 6–24)
CO2: 23 mmol/L (ref 20–29)
Calcium: 8.7 mg/dL (ref 8.7–10.2)
Chloride: 105 mmol/L (ref 96–106)
Creatinine, Ser: 0.95 mg/dL (ref 0.76–1.27)
Glucose: 90 mg/dL (ref 70–99)
Potassium: 3.9 mmol/L (ref 3.5–5.2)
Sodium: 141 mmol/L (ref 134–144)
eGFR: 94 mL/min/{1.73_m2} (ref >59)

## 2022-02-05 LAB — CBC
Hematocrit: 37.1 % — ABNORMAL LOW (ref 37.5–51.0)
Hemoglobin: 12.2 g/dL — ABNORMAL LOW (ref 13.0–17.7)
MCH: 32.2 pg (ref 26.6–33.0)
MCHC: 32.9 g/dL (ref 31.5–35.7)
MCV: 98 fL — ABNORMAL HIGH (ref 79–97)
Platelets: 229 10*3/uL (ref 150–450)
RBC: 3.79 x10E6/uL — ABNORMAL LOW (ref 4.14–5.80)
RDW: 12.3 % (ref 11.6–15.4)
WBC: 6 10*3/uL (ref 3.4–10.8)

## 2022-02-05 LAB — TSH: TSH: 1.72 u[IU]/mL (ref 0.450–4.500)

## 2022-02-06 NOTE — Addendum Note (Signed)
Addended by: Drue Novel I on: 02/06/2022 12:42 AM   Modules accepted: Orders

## 2022-02-19 ENCOUNTER — Other Ambulatory Visit: Payer: Self-pay

## 2022-02-19 DIAGNOSIS — E785 Hyperlipidemia, unspecified: Secondary | ICD-10-CM

## 2022-02-19 LAB — LIPID PANEL
Chol/HDL Ratio: 3.1 ratio (ref 0.0–5.0)
Cholesterol, Total: 157 mg/dL (ref 100–199)
HDL: 50 mg/dL (ref >39)
LDL Chol Calc (NIH): 96 mg/dL (ref 0–99)
Triglycerides: 52 mg/dL (ref 0–149)
VLDL Cholesterol Cal: 11 mg/dL (ref 5–40)

## 2022-02-19 LAB — HEPATIC FUNCTION PANEL
ALT: 12 IU/L (ref 0–44)
AST: 20 IU/L (ref 0–40)
Albumin: 4.1 g/dL (ref 3.8–4.9)
Alkaline Phosphatase: 65 IU/L (ref 44–121)
Bilirubin Total: 0.4 mg/dL (ref 0.0–1.2)
Bilirubin, Direct: 0.11 mg/dL (ref 0.00–0.40)
Total Protein: 6.6 g/dL (ref 6.0–8.5)

## 2022-02-26 ENCOUNTER — Other Ambulatory Visit: Payer: Self-pay

## 2022-02-26 DIAGNOSIS — E785 Hyperlipidemia, unspecified: Secondary | ICD-10-CM

## 2022-03-17 ENCOUNTER — Other Ambulatory Visit: Payer: Self-pay

## 2022-03-17 ENCOUNTER — Telehealth: Payer: Self-pay | Admitting: Pharmacist

## 2022-03-17 MED ORDER — APIXABAN 5 MG PO TABS: 5.0000 mg | ORAL_TABLET | Freq: Two times a day (BID) | ORAL | 1 refills | Status: DC

## 2022-03-17 NOTE — Telephone Encounter (Signed)
Called pt. Haven't spoken with him about pt assistance in a year and a half. He mentions having C[employer] now, but then says he doesn't have insurance and seems confused regarding medication coverage on the phone. States pharmacy tried to charge him over $100 for Eliquis. Will call his pharmacy since it looks like he has commercial insurance in which case he just needs a $10 copay card sent in and does not need pt assistance since he's no longer uninsured.  Pharmacy has active insurance on file for him. They took copay card info over the phone. Card wouldn't process, stated pt needed to call Eliquis #. Card is already activated so this should not be necessary. Called Eliquis line, they thought issue may  be that fill date needs to be today since card was activated today. Called pharmacy back, they said this was not the issue. Asked them to run as 1 month instead, that went through fine for $10. Pt aware.  Copay card info if needed for the future: BIN: [id number] PCN: [id number] GRP: [id number] ID: [id number]

## 2022-03-17 NOTE — Telephone Encounter (Signed)
  Leeroy Bock, RPH-CPP Pharmacist Pharmacy Telephone Encounter Sign at exiting of workspace Creation Time:  03/17/2022 11:14 AM   Sign at exiting of workspace      Called pt. Haven't spoken with him about pt assistance in a year and a half. He mentions having Cigna now, but then says he doesn't have insurance and seems confused regarding medication coverage on the phone. States pharmacy tried to charge him over $100 for Eliquis. Will call his pharmacy since it looks like he has commercial insurance in which case he just needs a $10 copay card sent in and does not need pt assistance since he's no longer uninsured.   Pharmacy has active insurance on file for him. They took copay card info over the phone. Card wouldn't process, stated pt needed to call Eliquis #. Card is already activated so this should not be necessary. Called Eliquis line, they thought issue may  be that fill date needs to be today since card was activated today. Called pharmacy back, they said this was not the issue. Asked them to run as 1 month instead, that went through fine for $10. Pt aware.   Copay card info if needed for the future: BIN: [id number] PCN: [id number] GRP: [id number] ID: [id number]            Willaim Sheng, [place]  Certified Medical Assistant Telephone Encounter Signed Creation Time:  03/17/2022 10:16 AM   Signed      Pt calling inquiring about his medication Eliquis, pt assistance application. Please address

## 2022-03-17 NOTE — Telephone Encounter (Signed)
Pt calling inquiring about his medication Eliquis, pt assistance application. Please address

## 2022-03-19 ENCOUNTER — Telehealth (HOSPITAL_COMMUNITY): Payer: Self-pay | Admitting: Emergency Medicine

## 2022-03-19 NOTE — Telephone Encounter (Signed)
Attempted to call patient regarding upcoming cardiac MRI appointment. Left message on voicemail with name and callback number Zamantha Strebel RN Navigator Cardiac Imaging [facility] [phone] Office [phone] Cell  

## 2022-03-20 ENCOUNTER — Other Ambulatory Visit: Payer: Self-pay | Admitting: Family

## 2022-03-20 ENCOUNTER — Ambulatory Visit (HOSPITAL_COMMUNITY)
Admission: RE | Admit: 2022-03-20 | Discharge: 2022-03-20 | Disposition: A | Discharge status: Home / Self Care | Payer: Self-pay | Source: Ambulatory Visit | Attending: Family | Admitting: Family

## 2022-03-20 DIAGNOSIS — I059 Rheumatic mitral valve disease, unspecified: Secondary | ICD-10-CM

## 2022-03-20 MED ORDER — GADOBUTROL 1 MMOL/ML IV SOLN
7.0000 mL | Freq: Once | INTRAVENOUS | Status: AC | PRN
Start: 2022-03-20 — End: 2022-03-20
  Administered 2022-03-20: 7 mL via INTRAVENOUS

## 2022-04-01 ENCOUNTER — Ambulatory Visit: Payer: Self-pay | Attending: Interventional Cardiology | Admitting: Interventional Cardiology

## 2022-04-09 ENCOUNTER — Telehealth: Payer: Self-pay | Admitting: Interventional Cardiology

## 2022-04-09 NOTE — Telephone Encounter (Signed)
Pt calling for MRI results. Informed him that he was notified 03/25/22 with results, but he does not remember.

## 2022-04-10 NOTE — Telephone Encounter (Signed)
I spoke with patient and reviewed cardiac MRI results with him.  Patient missed appointment with Dr [name] on August 30th.  He is aware of appointment with Dr [name] on October 19th and fasting lab appointment on 9/14

## 2022-04-16 ENCOUNTER — Ambulatory Visit: Payer: Self-pay | Attending: Interventional Cardiology

## 2022-04-16 DIAGNOSIS — E785 Hyperlipidemia, unspecified: Secondary | ICD-10-CM

## 2022-04-16 LAB — HEPATIC FUNCTION PANEL
ALT: 9 IU/L (ref 0–44)
AST: 19 IU/L (ref 0–40)
Albumin: 4.1 g/dL (ref 3.8–4.9)
Alkaline Phosphatase: 61 IU/L (ref 44–121)
Bilirubin Total: 0.2 mg/dL (ref 0.0–1.2)
Bilirubin, Direct: <0.1 mg/dL (ref 0.00–0.40)
Total Protein: 6.3 g/dL (ref 6.0–8.5)

## 2022-04-16 LAB — LIPID PANEL
Chol/HDL Ratio: 3.1 ratio (ref 0.0–5.0)
Cholesterol, Total: 161 mg/dL (ref 100–199)
HDL: 52 mg/dL (ref >39)
LDL Chol Calc (NIH): 97 mg/dL (ref 0–99)
Triglycerides: 59 mg/dL (ref 0–149)
VLDL Cholesterol Cal: 12 mg/dL (ref 5–40)

## 2022-04-20 ENCOUNTER — Other Ambulatory Visit: Payer: Self-pay

## 2022-04-20 DIAGNOSIS — E785 Hyperlipidemia, unspecified: Secondary | ICD-10-CM

## 2022-05-21 ENCOUNTER — Ambulatory Visit: Payer: Self-pay | Attending: Interventional Cardiology | Admitting: Interventional Cardiology

## 2022-05-21 ENCOUNTER — Encounter: Payer: Self-pay | Admitting: Interventional Cardiology

## 2022-05-21 VITALS — BP 148/84 | HR 68 | Ht 69.0 in | Wt 140.6 lb

## 2022-05-21 DIAGNOSIS — I10 Essential (primary) hypertension: Secondary | ICD-10-CM

## 2022-05-21 DIAGNOSIS — Q249 Congenital malformation of heart, unspecified: Secondary | ICD-10-CM

## 2022-05-21 DIAGNOSIS — I4892 Unspecified atrial flutter: Secondary | ICD-10-CM

## 2022-05-21 DIAGNOSIS — I700 Atherosclerosis of aorta: Secondary | ICD-10-CM

## 2022-05-21 DIAGNOSIS — E785 Hyperlipidemia, unspecified: Secondary | ICD-10-CM

## 2022-05-21 MED ORDER — ROSUVASTATIN CALCIUM 20 MG PO TABS: 20.0000 mg | ORAL_TABLET | Freq: Every day | ORAL | 3 refills | Status: DC

## 2022-05-21 NOTE — Patient Instructions (Signed)
Medication Instructions:  Your physician has recommended you make the following change in your medication: Increase Rosuvastatin to 20 mg by mouth daily  *If you need a refill on your cardiac medications before your next appointment, please call your pharmacy*   Lab Work: Your physician recommends that you return for lab work in: early December.  Lipid and liver profiles.  This will be fasting  If you have labs (blood work) drawn today and your tests are completely normal, you will receive your results only by: [place] (if you have MyChart) OR A paper copy in the mail If you have any lab test that is abnormal or we need to change your treatment, we will call you to review the results.   Testing/Procedures: none   Follow-Up: At [facility], you and your health needs are our priority.  As part of our continuing mission to provide you with exceptional heart care, we have created designated Provider Care Teams.  These Care Teams include your primary Cardiologist (physician) and Advanced Practice Providers (APPs -  Physician Assistants and Nurse Practitioners) who all work together to provide you with the care you need, when you need it.  We recommend signing up for the patient portal called "MyChart".  Sign up information is provided on this After Visit Summary.  MyChart is used to connect with patients for Virtual Visits (Telemedicine).  Patients are able to view lab/test results, encounter notes, upcoming appointments, etc.  Non-urgent messages can be sent to your provider as well.   To learn more about what you can do with MyChart, go to [email or web address].    Your next appointment:   6 month(s)  The format for your next appointment:   In Person  Provider:   [name], MD     Other Instructions You have been referred to hypertension clinic in our office.  Please schedule new patient appointment with pharmacist for 3-4 weeks from now  Check blood  pressure and keep record of readings.  Bring readings and blood pressure cuff to appointment with pharmacist  Important Information About Sugar

## 2022-05-21 NOTE — Progress Notes (Signed)
Cardiology Office Note   Date:  05/21/2022   ID:  Cassandria Golden., DOB [birth year], MRN [id number]  PCP:  [name], NP    No chief complaint on file.  Mitral regurgitation  Wt Readings from Last 3 Encounters:  05/21/22 140 lb 9.6 oz (63.8 kg)  02/04/22 139 lb 12.8 oz (63.4 kg)  01/08/22 140 lb (63.5 kg)       History of Present Illness: Terry Golden. is a 56 y.o. 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   History obtained from prior notes with congenital heart disease as above. Prior cardiac cath 2017 showed minor 20% LAD, nonobstructive CAD. He was admitted 12/2018 with atrial flutter and RVR. EF was 40% and felt to be tachymediated. He was seen by Dr. [name] who recommended continuing diltiazem and he ultimately underwent TEE/DCCV 12/2018. Subsequent echos showed normal LVEF. He had an admission in 12/2020 for severe IDA with Hgb 3.9 requiring multiple transfusions in the context of Covid; FOBT negative. EGD showed gastritis and he was treated with Protonix and iron. His Hgb has since normalized. When I saw him recently 12/09/21 he was doing well except had been out of his diltiazem and lisinopril for several weeks. These were restarted with plan for repeat surveillance echo as well. Echo was performed 12/26/21 showing now severe MR that is central (previously mild in 2020), with restricted movement in systole/diastole - only 1 prominent papillary muscle was seen so parachute-like mitral valve was also a possibility given congenital heart history. There was also mild MS, EF  50-55%, mild LVH, moderately reduced RVF, mild RVE, severely dilated LA, & mildly dilated RA. Per discussion with Dr. [name], TEE was pursued with complex findings as outlined:   1. History of primum ASD with MV cleft s/p repair. The mitral valve leaflets appear thickened and restricted. There is mild mitral stenosis with mean gradient 33mHg at HR 60bpm. There is a single papillary muscle visualized to which all mitral valve  chords appear to be attached. 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 There is moderate mitral regurgitation by PISA with EROA 0.24cm2, RVol 589m 3D VC 0.3cm. There is systolic blunting in the LUPV. During interrogation of the RUPV and RLPV the  patient appeared to enter a junctional rhythm with a short atrial reversal wave without true systolic flow reversal visualized (although systolic flow reversal was clearly seen on surface echo). Given discrepant values and possible underestimation of MR  on current study (may be related to lower blood pressures in the setting of sedation), recommend CMR for further evaluation. Could also consider RHC to assess PA pressures.   2. Left ventricular ejection fraction, by estimation, is 50 to 55%. The left ventricle has low normal function.   3. Patient is s/p VSD repair with no residual shunting visualized by color doppler.   4. Right ventricular systolic function is moderately reduced. The right ventricular size is mildly enlarged.   5. Left atrial size was severely dilated. No left atrial/left atrial  appendage thrombus was detected.   6. Right atrial size was severely dilated.  7. The patient is s/p primum ASD repair. There appears to be two tricuspid leaflets present following repair. One of the leaflets is elongated and overriding compared to the other. There is mild tricuspid regurgitation. There is no tricuspid stenosis.  The tricuspid valve is abnormal.   8. The aortic valve is tricuspid. Aortic valve regurgitation is not   visualized. No aortic stenosis is present.   9. Patient is s/p primum ASD repair. No residual shunting detected by color flow doppler.    Cardiac MRI was recommended for further evaluation. We also started rosuvastatin in 12/2021 for goal LDL <70.   Denies : Chest pain. Dizziness. Leg edema. Nitroglycerin use. Orthopnea. Paroxysmal nocturnal dyspnea. Shortness of breath. Syncope.    No sustained palpitations.   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   right   Mild CAD    Mitral valve disease 03/25/1999   s/p mitral valve repair with commissuroplasty    NICM (nonischemic cardiomyopathy) ([place])    EF 40% in 2020, recovered   Rheumatic fever    Syncope    1999   Typical atrial flutter ([place])    VSD (ventricular septal defect)    small    Past Surgical History:  Procedure Laterality Date   ASD AND VSD REPAIR     BIOPSY  12/16/2020   Procedure: BIOPSY;  Surgeon: Gatha Mayer, MD;  Location: [facility];  Service: Gastroenterology;;   CARDIAC CATHETERIZATION N/A 09/20/2015   Procedure: Right/Left Heart Cath and Coronary Angiography;  Surgeon: Jettie Booze, MD;  Location: [facility];  Service: Cardiovascular;  Laterality: N/A;   CARDIAC SURGERY     s/p partial AVSD repair and cleft mitral valve repair with commissuroplasty   CARDIOVERSION N/A 12/07/2018   Procedure: CARDIOVERSION;  Surgeon: Dorothy Spark, MD;  Location: [facility] ENDOSCOPY;  Service: Cardiovascular;  Laterality: N/A;   COLONOSCOPY  07/08/2020   ESOPHAGOGASTRODUODENOSCOPY (EGD) WITH PROPOFOL N/A 12/16/2020   Procedure: ESOPHAGOGASTRODUODENOSCOPY (EGD) WITH PROPOFOL;  Surgeon: Gatha Mayer, MD;  Location: [facility] ENDOSCOPY;  Service: Gastroenterology;  Laterality: N/A;   LIPOMA EXCISION Right 06/19/2020   Procedure: EXCISION SUBCUTANEOUS LIPOMA RIGHT  SHOULDER;  Surgeon: Donnie Mesa, MD;  Location: [facility];  Service: General;  Laterality: Right;   MITRAL VALVE REPAIR     TEE WITHOUT CARDIOVERSION N/A 12/07/2018   Procedure: TRANSESOPHAGEAL ECHOCARDIOGRAM (TEE);  Surgeon: Dorothy Spark, MD;  Location: [facility] ENDOSCOPY;  Service: Cardiovascular;  Laterality: N/A;   TEE WITHOUT CARDIOVERSION N/A 01/08/2022   Procedure: TRANSESOPHAGEAL ECHOCARDIOGRAM (TEE);  Surgeon: Freada Bergeron, MD;  Location: [facility] ENDOSCOPY;  Service: Cardiovascular;  Laterality: N/A;     Current Outpatient Medications  Medication Sig Dispense Refill   amoxicillin (AMOXIL) 500 MG capsule Take 4 capsules by mouth 30-60 minutes prior to dental work 4 capsule 3   apixaban (ELIQUIS) 5 MG TABS tablet Take 1 tablet (5 mg total) by mouth 2 (two) times daily. 180 tablet 1   aspirin-acetaminophen-caffeine (EXCEDRIN MIGRAINE) 250-250-65 MG tablet Take 1 tablet by mouth every 6 (six) hours as needed for headache.     diltiazem (CARDIZEM CD) 240 MG 24 hr capsule Take 1 capsule (240 mg total) by mouth daily. 90 capsule 1   ferrous sulfate 325 (65 FE) MG tablet Take 1  tablet (325 mg total) by mouth daily with breakfast. 30 tablet 1   lisinopril (ZESTRIL) 5 MG tablet Take 1 tablet (5 mg total) by mouth daily. 90 tablet 1   pantoprazole (PROTONIX) 40 MG tablet Take 1 tablet (40 mg total) by mouth daily before breakfast. 30 tablet 1   rosuvastatin (CRESTOR) 10 MG tablet Take 1 tablet (10 mg total) by mouth daily. 90 tablet 1   No current facility-administered medications for this visit.    Allergies:   Tensilon [edrophonium]    Social History:  The patient  reports that he has been smoking cigarettes. He has a 10.50 pack-year smoking history. He has never used smokeless tobacco. He reports that he does not drink alcohol and does not use drugs.   Family History:  The patient's family history includes Alcohol abuse in an other family member; Arthritis in some other family members;  Breast cancer in an other family member; Diabetes in his sister; Heart attack in his father; Heart disease in his sister and other family members; Hypertension in an other family member; Kidney disease in an other family member; Mental illness in his sister and another family member; Stroke in his father and another family member.    ROS:  Please see the history of present illness.   Otherwise, review of systems are positive for difficulty gaining weight.   All other systems are reviewed and negative.    PHYSICAL EXAM: VS:  BP (!) 148/84   Pulse 68   Ht '5\' 9"'$  (1.753 m)   Wt 140 lb 9.6 oz (63.8 kg)   SpO2 98%   BMI 20.76 kg/m  , BMI Body mass index is 20.76 kg/m. GEN: Well nourished, well developed, in no acute distress HEENT: normal Neck: no JVD, carotid bruits, or masses Cardiac: RRR; 2/6 systolic murmurs, rubs, or gallops,no edema  Respiratory:  clear to auscultation bilaterally, normal work of breathing GI: soft, nontender, nondistended, + BS MS: no deformity or atrophy Skin: warm and dry, no rash Neuro:  Strength and sensation are intact Psych: euthymic mood, full affect   EKG:   The ekg ordered 7/23 demonstrates sinus bradycardia   Recent Labs: 02/04/2022: BUN 14; Creatinine, Ser 0.95; Hemoglobin 12.2; Platelets 229; Potassium 3.9; Sodium 141; TSH 1.720 04/16/2022: ALT 9   Lipid Panel    Component Value Date/Time   CHOL 161 04/16/2022 0725   TRIG 59 04/16/2022 0725   HDL 52 04/16/2022 0725   CHOLHDL 3.1 04/16/2022 0725   CHOLHDL 3.7 12/04/2018 0859   VLDL 11 12/04/2018 0859   LDLCALC 97 04/16/2022 0725     Other studies Reviewed: Additional studies/ records that were reviewed today with results demonstrating: labs reviewed.   ASSESSMENT AND PLAN:  Mitral regurgitation: Severe by echo but moderate by MRI.  Mildly decreased LVEF.  Congenital heart disease: s/p ASD repair,cleft mitral valve repair at [facility] at age 30.   Will check with structural team if further  intervention is needed.  Aortic atherosclerosis: noted by prior CT scan in 2021.  Rosuvastatin started.  LDL 97, unchanged with rosuvastatin 10 mg daily.  Would like to see LDL at 70.  Increase rosuvastatin to 20 mg daily. HFrEF: EF has been variable over the years.  EF 42% on MRI. On lisinopril.  He does not remember home BP readings.  Had run out of his meds recently but now has supplies as of 2 days ago.   Follow-up in the Pharm.D. hypertension clinic in 3 to 4 weeks.  Currently taking diltiazem because of the past history of atrial flutter.  Carvedilol is preferable for his heart but since rhythm has been stable, will continue diltiazem.  Has had bradycardia in the past with higher doses of diltiazem.  Keep a list of home blood pressure readings to bring to the next appointment. Atrial flutter: No bleeding problems on the Eliquis.   Current medicines are reviewed at length with the patient today.  The patient concerns regarding his medicines were addressed.  The following changes have been made: Increase rosuvastatin to 20 mg daily  Labs/ tests ordered today include:  No orders of the defined types were placed in this encounter.   Recommend 150 minutes/week of aerobic exercise Low fat, low carb, high fiber diet recommended  Disposition:   FU in 6 months with me, 3 to 4 weeks with Pharm.D. hypertension   Signed, [name], MD  05/21/2022 9:55 AM    [address] [place] [place], [place], [place]  [address] Phone: [phone]; Fax: [phone]

## 2022-05-25 ENCOUNTER — Telehealth: Payer: Self-pay | Admitting: *Deleted

## 2022-05-25 DIAGNOSIS — I059 Rheumatic mitral valve disease, unspecified: Secondary | ICD-10-CM

## 2022-05-25 NOTE — Telephone Encounter (Signed)
Patient notified and referral placed

## 2022-05-25 NOTE — Telephone Encounter (Signed)
Terry Booze, MD  [name], RN Our mitral team reviewed the images and we would like to have him get a consultation with Dr. Lavonna Monarch regarding the TEE and MRI findings.   JV

## 2022-06-04 NOTE — Progress Notes (Signed)
Terry Golden       Terry Golden             Terry Jr. 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 Pt has been followed and had admissions in the past for atrial flutter and significant GI bleed (no specific source but gastritis presumbed well controlled since on antacid therapy). Pt rhythm controlled on diltiazem and is anticoagulated on eliquis. Pt has been reporting tiredness, DOE and some lightheadedness that he described were symptoms before his original surgery. He has undergone multiple studies including TEE with preserved LV function and moderate MR (concerned underestimating MR) and concerns for parachute mitral valve anatomy. He was sent for MRI to further assess MR and was found there to have moderately decreased LV function with EF 42% but with moderate MR with RF <20%. Pt is being seen to assess need for surgical intervention. Pt is currently unemployed and no longer married and is very busy taking care of his extended family with ongoing medical issues.      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   right   Mild CAD    Mitral valve disease 03/25/1999   s/p mitral valve repair with commissuroplasty    NICM (nonischemic cardiomyopathy) ([place])    EF 40% in 2020, recovered   Rheumatic fever    Syncope    1999   Typical atrial flutter ([place])    VSD (ventricular septal defect)    small    Past Surgical  History:  Procedure Laterality Date   ASD AND VSD REPAIR     BIOPSY  12/16/2020   Procedure: BIOPSY;  Surgeon: Gatha Mayer, MD;  Location: [facility];  Service: Gastroenterology;;   CARDIAC CATHETERIZATION N/A 09/20/2015   Procedure: Right/Left Heart Cath and Coronary Angiography;  Surgeon: Jettie Booze, MD;  Location: [place] [facility];  Service: Cardiovascular;  Laterality: N/A;   CARDIAC SURGERY     s/p partial AVSD repair and cleft mitral valve repair with commissuroplasty   CARDIOVERSION N/A 12/07/2018   Procedure: CARDIOVERSION;  Surgeon: Dorothy Spark, MD;  Location: [facility] ENDOSCOPY;  Service: Cardiovascular;  Laterality: N/A;   COLONOSCOPY  07/08/2020   ESOPHAGOGASTRODUODENOSCOPY (EGD) WITH PROPOFOL N/A 12/16/2020   Procedure: ESOPHAGOGASTRODUODENOSCOPY (EGD) WITH PROPOFOL;  Surgeon: Gatha Mayer, MD;  Location: [facility] ENDOSCOPY;  Service: Gastroenterology;  Laterality: N/A;   LIPOMA EXCISION Right 06/19/2020   Procedure: EXCISION SUBCUTANEOUS LIPOMA RIGHT SHOULDER;  Surgeon: Donnie Mesa, MD;  Location: [place];  Service: General;  Laterality: Right;   MITRAL VALVE REPAIR     TEE WITHOUT CARDIOVERSION N/A 12/07/2018   Procedure: TRANSESOPHAGEAL ECHOCARDIOGRAM (TEE);  Surgeon: Dorothy Spark, MD;  Location: [place];  Service: Cardiovascular;  Laterality: N/A;   TEE WITHOUT CARDIOVERSION N/A 01/08/2022  Procedure: TRANSESOPHAGEAL ECHOCARDIOGRAM (TEE);  Surgeon: Freada Bergeron, MD;  Location: [facility] ENDOSCOPY;  Service: Cardiovascular;  Laterality: N/A;    Social History   Tobacco Use  Smoking Status Every Day   Packs/day: 0.25   Years: 42.00   Total pack years: 10.50   Types: Cigarettes  Smokeless Tobacco Never  Tobacco Comments   3 cigarettes per day( 07/08/20)    Social History   Substance and Sexual Activity  Alcohol Use Never   Alcohol/week: 0.0 standard drinks of alcohol    Social History   Socioeconomic History   Marital status: Divorced     Spouse name: Not on file   Number of children: 1   Years of education: 12   Highest education level: Not on file  Occupational History   Not on file  Tobacco Use   Smoking status: Every Day    Packs/day: 0.25    Years: 42.00    Total pack years: 10.50    Types: Cigarettes   Smokeless tobacco: Never   Tobacco comments:    3 cigarettes per day( 07/08/20)  Vaping Use   Vaping Use: Never used  Substance and Sexual Activity   Alcohol use: Never    Alcohol/week: 0.0 standard drinks of alcohol   Drug use: No   Sexual activity: Not on file  Other Topics Concern   Not on file  Social History Narrative   Not on file   Social Determinants of Health   Financial Resource Strain: Not on file  Food Insecurity: Not on file  Transportation Needs: Not on file  Physical Activity: Not on file  Stress: Not on file  Social Connections: Not on file  Intimate Partner Violence: Not on file    Allergies  Allergen Reactions   Tensilon [Edrophonium] Other (See Comments)    Blacked out     Current Outpatient Medications  Medication Sig Dispense Refill   amoxicillin (AMOXIL) 500 MG capsule Take 4 capsules by mouth 30-60 minutes prior to dental work 4 capsule 3   apixaban (ELIQUIS) 5 MG TABS tablet Take 1 tablet (5 mg total) by mouth 2 (two) times daily. 180 tablet 1   aspirin-acetaminophen-caffeine (EXCEDRIN MIGRAINE) 250-250-65 MG tablet Take 1 tablet by mouth every 6 (six) hours as needed for headache.     diltiazem (CARDIZEM CD) 240 MG 24 hr capsule Take 1 capsule (240 mg total) by mouth daily. 90 capsule 1   ferrous sulfate 325 (65 FE) MG tablet Take 1 tablet (325 mg total) by mouth daily with breakfast. 30 tablet 1   lisinopril (ZESTRIL) 5 MG tablet Take 1 tablet (5 mg total) by mouth daily. 90 tablet 1   pantoprazole (PROTONIX) 40 MG tablet Take 1 tablet (40 mg total) by mouth daily before breakfast. 30 tablet 1   rosuvastatin (CRESTOR) 20 MG tablet Take 1 tablet (20 mg total) by mouth  daily. 90 tablet 3   No current facility-administered medications for this visit.     Family History  Problem Relation Age of Onset   Diabetes Sister    Mental illness Sister    Heart disease Sister    Arthritis Other    Hypertension Other    Alcohol abuse Other    Heart attack Father    Stroke Father    Heart disease Other    Arthritis Other    Heart disease Other    Stroke Other    Mental illness Other    Kidney disease Other  Breast cancer Other        aunts   Colon cancer Neg Hx    Colon polyps Neg Hx    Esophageal cancer Neg Hx    Rectal cancer Neg Hx    Stomach cancer Neg Hx        Physical Exam: BP 152/87  P75  Heent: teeth In poor repair Lungs: clear Card: RR with 2/6 sem radiating to axilla Ext: no edema     Diagnostic Studies & Laboratory data: I have personally reviewed the following studies and agree with the findings   TTE (12/2021)  IMPRESSIONS   1. 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 There is  severe MV regurgitation that is central. The leaflets are thickened with  restricted movement in systole/diastole possibly related to rheumatic  heart disease. Only 1 prominent papillary  muscle is seen so parachute-like mitral valve is also a possibility given  congenital heart history. MG 3 mmHG @ 55 bpm. There is systolic flow  reversal in the right inferior PV. Would recommend a TEE for  calrification. The mitral valve is abnormal.  Severe mitral valve regurgitation. Mild mitral stenosis. There is a  commisuroplasty present in the mitral position. Procedure Date: 03/25/1999.   2. Left ventricular ejection fraction, by estimation, is 50 to 55%. The  left ventricle has low normal function. The left ventricle has no regional  wall motion abnormalities. There is mild concentric left ventricular  hypertrophy. Left ventricular  diastolic function could not be evaluated.   3. Right ventricular systolic function is moderately reduced.  The right  ventricular size is mildly enlarged. Tricuspid regurgitation signal is  inadequate for assessing PA pressure.   4. Left atrial size was severely dilated.   5. History of Primum ASD repair.   6. Right atrial size was mildly dilated.   7. The aortic valve is tricuspid. Aortic valve regurgitation is not  visualized. No aortic stenosis is present.   8. The inferior vena cava is normal in size with greater than 50%  respiratory variability, suggesting right atrial pressure of 3 mmHg.   Comparison(s): Changes from prior study are noted. MR is severe. EF ~50%.   FINDINGS   Left Ventricle: Left ventricular ejection fraction, by estimation, is 50  to 55%. The left ventricle has low normal function. The left ventricle has  no regional wall motion abnormalities. The left ventricular internal  cavity size was normal in size.  There is mild concentric left ventricular hypertrophy. Abnormal  (paradoxical) septal motion consistent with post-operative status. Left  ventricular diastolic function could not be evaluated.   Right Ventricle: The right ventricular size is mildly enlarged. No  increase in right ventricular wall thickness. Right ventricular systolic  function is moderately reduced. Tricuspid regurgitation signal is  inadequate for assessing PA pressure.   Left Atrium: Left atrial size was severely dilated.   Right Atrium: Right atrial size was mildly dilated.   Pericardium: There is no evidence of pericardial effusion.   Mitral Valve: History of primum ASD with MV cleft status post repair.  There is severe MV regurgitation that is central. The leaflets are  thickened with restricted movement in systole/diastole possibly related to  rheumatic heart disease. Only 1 prominent  papillary muscle is seen so parachute-like mitral valve is also a  possibility given congenital heart history. MG 3 mmHG @ 55 bpm. There is  systolic flow reversal in the right inferior PV. Would  recommend a TEE for  calrification. The mitral valve is  abnormal. Severe mitral valve regurgitation. There is a commisuroplasty  present in the mitral position. Procedure Date: 03/25/1999. Mild mitral  valve stenosis. MV peak gradient, 10.5 mmHg. The mean mitral valve  gradient is 3.0 mmHg with average heart rate   of 55 bpm.   Tricuspid Valve: The tricuspid valve is grossly normal. Tricuspid valve  regurgitation is mild . No evidence of tricuspid stenosis.   Aortic Valve: The aortic valve is tricuspid. Aortic valve regurgitation is  not visualized. No aortic stenosis is present.   Pulmonic Valve: The pulmonic valve was grossly normal. Pulmonic valve  regurgitation is trivial. No evidence of pulmonic stenosis.   Aorta: The aortic root and ascending aorta are structurally normal, with  no evidence of dilitation.   Venous: The inferior vena cava is normal in size with greater than 50%  respiratory variability, suggesting right atrial pressure of 3 mmHg.   IAS/Shunts: The atrial septum is grossly normal.     LEFT VENTRICLE  PLAX 2D  LVIDd:         5.30 cm  LVIDs:         3.80 cm  LV PW:         1.20 cm  LV IVS:        1.20 cm  LVOT diam:     2.00 cm      3D Volume EF:  LV SV:         105          3D EF:        50 %  LV SV Index:   59           LV EDV:       167 ml  LVOT Area:     3.14 cm     LV ESV:       83 ml                              LV SV:        84 ml    LV Volumes (MOD)  LV vol d, MOD A2C: 124.0 ml  LV vol d, MOD A4C: 119.0 ml  LV vol s, MOD A2C: 68.1 ml  LV vol s, MOD A4C: 59.0 ml  LV SV MOD A2C:     55.9 ml  LV SV MOD A4C:     119.0 ml  LV SV MOD BP:      58.4 ml   RIGHT VENTRICLE  RV Basal diam:  4.20 cm  RV Mid diam:    2.70 cm  RV S prime:     10.90 cm/s  TAPSE (M-mode): 1.5 cm   LEFT ATRIUM              Index        RIGHT ATRIUM           Index  LA diam:        3.80 cm  2.14 cm/m   RA Area:     16.50 cm  LA Vol (A2C):   143.0 ml 80.59 ml/m  RA  Volume:   53.20 ml  29.98 ml/m  LA Vol (A4C):   75.8 ml  42.72 ml/m  LA Biplane Vol: 104.0 ml 58.61 ml/m   AORTIC VALVE  LVOT Vmax:   155.00 cm/s  LVOT Vmean:  101.000 cm/s  LVOT VTI:    0.335 m  AORTA  Ao Root diam: 3.40 cm  Ao Asc diam:  3.40 cm   MITRAL VALVE  MV Area (PHT): 2.22 cm       SHUNTS  MV Area VTI:   1.97 cm       Systemic VTI:  0.34 m  MV Peak grad:  10.5 mmHg      Systemic Diam: 2.00 cm  MV Mean grad:  3.0 mmHg  MV Vmax:       1.62 m/s   TEE (01/2022) IMPRESSIONS   1. History of primum ASD with MV cleft s/p repair. The mitral valve  leaflets appear thickened and restricted. There is mild mitral stenosis  with mean gradient 69mHg at HR 60bpm. There is a single papillary muscle  visualized to which all mitral valve  chords appear to be attached. 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 There is moderate mitral regurgitation by PISA with EROA 0.24cm2,  RVol 57m 3D VC 0.3cm. There is systolic blunting in the LUPV. During  interrogation of the RUPV and RLPV the   patient appeared to enter a junctional rhythm with a short atrial  reversal wave without true systolic flow reversal visualized (although  systolic flow reversal was clearly seen on surface echo). Given discrepant  values and possible underestimation of MR   on current study (may be related to lower blood pressures in the setting  of sedation), recommend CMR for further evaluation. Could also consider  RHC to assess PA pressures.   2. Left ventricular ejection fraction, by estimation, is 50 to 55%. The  left ventricle has low normal function.   3. Patient is s/p VSD repair with no residual shunting visualized by  color doppler.   4. Right ventricular systolic function is moderately reduced. The right  ventricular size is mildly enlarged.   5. Left atrial size was severely dilated. No left atrial/left atrial  appendage thrombus was detected.   6. Right atrial size was severely dilated.    7. The patient is s/p primum ASD repair. There appears to be two  tricuspid leaflets present following repair. One of the leaflets is  elongated and overriding compared to the other. There is mild tricuspid  regurgitation. There is no tricuspid stenosis.  The tricuspid valve is abnormal.   8. The aortic valve is tricuspid. Aortic valve regurgitation is not  visualized. No aortic stenosis is present.   9. Patient is s/p primum ASD repair. No residual shunting detected by  color flow doppler.   FINDINGS   Left Ventricle: Left ventricular ejection fraction, by estimation, is 50  to 55%. The left ventricle has low normal function. The left ventricular  internal cavity size was normal in size.   Right Ventricle: The right ventricular size is mildly enlarged. No  increase in right ventricular wall thickness. Right ventricular systolic  function is moderately reduced.   Left Atrium: Left atrial size was severely dilated. No left atrial/left  atrial appendage thrombus was detected.   Right Atrium: Right atrial size was severely dilated.   Pericardium: There is no evidence of pericardial effusion.   Mitral Valve: History of primum ASD with MV cleft s/p repair. The mitral  valve leaflets appear thickened and restricted. There is mild mitral  stenosis with mean gradient 66m68m at HR 60bpm. There is a single papillary  muscle visualized to which all mitral   valve chords appear to be attached. Findings suggestive of a parachute  mitral valve. There is moderate mitral regurgitation by  PISA with EROA  0.24cm2, RVol 38m. 3D VC 0.3cm. There is systolic blunting in the LUPV.  During interrogation of the RUPV and  RLPV the patient appeared to enter a junctional rhythm with a short atrial  reversal wave without true systolic flow reversal visualized (although  systolic flow reversal was clearly seen on surface echo). Given discrepant  values and possible  underestimation of MR on current study  (may be related to lower blood  pressures in the setting of sedation), recommend CMR for further  evaluation. Could also consider RHC to assess PA pressures. The mitral  valve has been repaired/replaced. MV peak  gradient, 13.0 mmHg. The mean mitral valve gradient is 3.0 mmHg.   Tricuspid Valve: The patient is s/p primum ASD repair. There appears to be  two tricuspid leaflets present following repair. One of the leaflets is  elongated and overriding compared to the other. There is mild tricuspid  regurgitation. There is no  tricuspid stenosis. The tricuspid valve is abnormal. Tricuspid valve  regurgitation is mild.   Aortic Valve: The aortic valve is tricuspid. Aortic valve regurgitation is  not visualized. No aortic stenosis is present.   Pulmonic Valve: The pulmonic valve was normal in structure. Pulmonic valve  regurgitation is trivial.   Aorta: The aortic root is normal in size and structure.   IAS/Shunts: Patient is s/p primum ASD repair. No residual shunting  detected by color flow doppler.     MITRAL VALVE                  TRICUSPID VALVE  MV Peak grad: 13.0 mmHg       TR Peak grad:   23.8 mmHg  MV Mean grad: 3.0 mmHg        TR Vmax:        244.00 cm/s  MV Vmax:      1.80 m/s  MV Vmean:     82.0 cm/s  MR Peak grad:    168.0 mmHg  MR Mean grad:    102.0 mmHg  MR Vmax:         648.00 cm/s  MR Vmean:        467.0 cm/s  MR PISA:         3.08 cm  MR PISA Eff ROA: 18 mm  MR PISA Radius:  0.70 cm   MV Vmean:      78.0 cm/s  MV Decel Time: 342 msec  MR Peak grad:    190.4 mmHg  MR Mean grad:    114.0 mmHg  MR Vmax:         690.00 cm/s  MR Vmean:        500.0 cm/s  MR PISA:         2.65 cm  MR PISA Eff ROA: 15 mm  MR PISA Radius:  0.65 cm  MV E velocity: 132.00 cm/s  MV A velocity: 124.00 cm/s  MV E/A rat  MRI (03/2022) FINDINGS: Left ventricle:   -Mild dilatation   -Mild systolic dysfunction   -Normal ECV (28%)   -Normal T2 values   -RV insertion site  LGE   LV EF: 42% (Normal 56-78%)   Absolute volumes:   LV EDV: 2166m(Normal 77-195 mL)   LV ESV: 12664mNormal 19-72 mL)   LV SV: 36m17mormal 51-133 mL)   CO: 4.5L/min (Normal 2.8-8.8 L/min)   Indexed volumes:   LV EDV: 123mL18mm (Normal 47-92 mL/sq-m)   LV ESV: 72mL/66m (Normal 13-30 mL/sq-m)  LV SV: 46m/sq-m (Normal 32-62 mL/sq-m)   CI: 2.6L/min/sq-m (Normal 1.7-4.2 L/min/sq-m)   Right ventricle: Mild dilatation with mild systolic dysfunction   RV EF:  41% (Normal 47-74%)   Absolute volumes:   RV EDV: 2154m(Normal 88-227 mL)   RV ESV: 12549mNormal 23-103 mL)   RV SV: 74m4mormal 52-138 mL)   CO: 4.3L/min (Normal 2.8-8.8 L/min)   Indexed volumes:   RV EDV: 119mL25mm (Normal 55-105 mL/sq-m)   RV ESV: 71mL/75m (Normal 15-43 mL/sq-m)   RV SV: 48mL/s84m(Normal 32-64 mL/sq-m)   CI: 2.4L/min/sq-m (Normal 1.7-4.2 L/min/sq-m)   Left atrium: Mild enlargement. S/p atrioventricular septal defect repair. Qp/Qs 1.0   Right atrium: Normal size   Mitral valve: Moderate regurgitation (regurgitant fraction 20%)   Aortic valve: No regurgitation   Tricuspid valve: Mild regurgitation (regurgitant fraction 14%)   Pulmonic valve: No regurgitation   Aorta: Normal proximal ascending aorta   Pericardium: Normal   IMPRESSION: 1.  Mild LV dilatation with mild systolic dysfunction (EF 42%)   12% Mild RV dilatation with mild systolic dysfunction (EF 41%)   75%Status post atrioventricular septal defect repair. Qp/Qs 1.0, indicating no residual shunt   4.  Moderate mitral regurgitation (regurgitant fraction 20%)   5.  Mild tricuspid regurgitation (regurgitant fraction 14%)  io:  1.06      Recent Radiology Findings:   No results found.    Recent Lab Findings: Lab Results  Component Value Date   WBC 6.0 02/04/2022   HGB 12.2 (L) 02/04/2022   HCT 37.1 (L) 02/04/2022   PLT 229 02/04/2022   GLUCOSE 90 02/04/2022   CHOL 161 04/16/2022   TRIG 59  04/16/2022   HDL 52 04/16/2022   LDLCALC 97 04/16/2022   ALT 9 04/16/2022   AST 19 04/16/2022   NA 141 02/04/2022   K 3.9 02/04/2022   CL 105 02/04/2022   CREATININE 0.95 02/04/2022   BUN 14 02/04/2022   CO2 23 02/04/2022   TSH 1.720 02/04/2022   INR 1.3 (H) 12/05/2018   HGBA1C 5.1 12/04/2018      Assessment / Plan:     Pt is a 56 yo w19with now recurrent MR, atrial flutter, and possible depressed LV function by MR but normal by TEE. Pt has a difficult situation in that his level of MR is approaching need for intervention however with the TEE/MRI findings would likely require replacement secondary to anatomy of valve/papillary muscles. At his age he would best be served with mechanical prosthesis however with his history of GI bleed may make that prohibitive. He understands that with tissue valve replacement, the longevity may not last more than 10 yrs and would require another procedure at that  point.  With his MR currently moderate by MRI and TEE, I would favor obtaining a RHC to assess the level of filling pressures and Pulmonary HTN. If these are elevated, I would recommend surgical intervention. If low then aggressive medical management toward BP control and diuretics would allow pushing procedure in the future when medical therapy fails and hopefully tissue prosthesis at that point at a higher age at implant. Pt understands the issues and will follow up with Dr VeranasLacie Draftonths or sooner if symptoms arise or with scheduling or RHC as discussed above   I have spent 60 min in review of the records, viewing studies and in face to face with patient and in coordination of future care    Terry Golden 10:33  AM  

## 2022-06-08 ENCOUNTER — Institutional Professional Consult (permissible substitution) (INDEPENDENT_AMBULATORY_CARE_PROVIDER_SITE_OTHER): Payer: Self-pay | Admitting: Thoracic Surgery (Cardiothoracic Vascular Surgery)

## 2022-06-08 ENCOUNTER — Encounter: Payer: Self-pay | Admitting: Thoracic Surgery (Cardiothoracic Vascular Surgery)

## 2022-06-08 VITALS — BP 152/87 | HR 75 | Resp 18 | Ht 69.0 in | Wt 143.0 lb

## 2022-06-08 DIAGNOSIS — I340 Nonrheumatic mitral (valve) insufficiency: Secondary | ICD-10-CM

## 2022-06-08 DIAGNOSIS — I059 Rheumatic mitral valve disease, unspecified: Secondary | ICD-10-CM

## 2022-06-15 ENCOUNTER — Other Ambulatory Visit: Payer: Self-pay

## 2022-06-30 ENCOUNTER — Ambulatory Visit: Payer: Self-pay | Attending: Interventional Cardiology | Admitting: Pharmacist

## 2022-06-30 VITALS — BP 134/66

## 2022-06-30 DIAGNOSIS — I10 Essential (primary) hypertension: Secondary | ICD-10-CM | POA: Insufficient documentation

## 2022-06-30 DIAGNOSIS — E785 Hyperlipidemia, unspecified: Secondary | ICD-10-CM

## 2022-06-30 MED ORDER — LISINOPRIL 10 MG PO TABS: 10.0000 mg | ORAL_TABLET | Freq: Every day | ORAL | 3 refills | Status: DC

## 2022-06-30 MED ORDER — FERROUS SULFATE 325 (65 FE) MG PO TABS: 325.0000 mg | ORAL_TABLET | Freq: Every day | ORAL | 1 refills | Status: DC

## 2022-06-30 NOTE — Assessment & Plan Note (Signed)
Assessment: Patient does not take blood pressure at home and did not bring cuff In office blood pressure was slightly above goal 134/66 Patient is eating healthier, does not eat out, or eat fried foods Limited physical activity in the day with having to watch sister and take care of mother Currently in the process of trying to quit smoking: has cut down to 2 cigarettes daily  Has been adherent with all his medications   Plan Increase lisinopril to 10 mg daily  Follow up labs within 2 weeks on 07/14/2022 Follow up in person appointment on 08/10/2022 at 08:30AM Bring in blood pressure cuff and readings for next appointment to assess accuracy and home blood pressure Use proper technique when checking blood pressure: feet flat on ground, back supported in chair, emptied bladder, arm at heart level, waiting 5 minutes prior to first read, if still high take blood pressure again within a few minutes after Continue to try and eat healthy, limiting salt intake Try to increase physical activity to 30 minutes daily: does not have to be all at once can break it up throughout the day and can try walking Continue to try and quit smoking and to reach out if want to try medications and to set an end date

## 2022-06-30 NOTE — Progress Notes (Signed)
Patient ID: Terry Golden.                 DOB: 12/31/1965                      MRN: [id number]      HPI: Terry Golden. is a 56 y.o. male referred by Dr. [name] to HTN clinic. 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 TEE showed an EF of 50-55% in June 2023. Patient had an elevated blood pressure at the time of follow up of 148/84. He did not remember his home BP readings and at the time recently ran out of his medications, but just picked up prescriptions and was taking them for two days prior to appointment with Dr. [name]. He is currently taking lisinopril 5 mg for blood pressure control. Last BMET in 01/2022 and was stable and within normal limits. He is also taking diltiazem for past history of atrial flutter with stable rhythm so has not been changed due to history of HFrEF. His rosuvastatin was increased from 10 to 20 mg in October after last lipid panel showed TC 161, TG 59, LDL-C 96. LDL-C was unchanged with rosuvastatin 10 mg and goal is <70.  Today patient presents to follow up with his blood pressure management. Patient states he has not been taking blood pressure at home, but does have a home cuff. He has been busy keeping up with his family and being with his mother in the hospital. He did not have any home blood pressure readings to get an adequate idea of what his blood pressure is running outside of clinic. He reports he has no issues with adhering to his medications and not missing any doses. He reports no signs/symptoms of low blood pressure (dizziness, fatigue) or high blood pressure  (headaches). He is trying to eat healthier and stays away from fried foods, eating out, and states eats salads at home. He denies any salt use for his meals. Physical activity has been limited due to everything going on in his life right now. Rosuvastatin was increased at last appointment and patient has no issues with current dose. He currently smokes 2 cigarettes a day and has been trying to quit. He does not want to use dual nicotine replacement therapy and is motivated to quit all together-believes its about mentality.  Current HTN meds: lisinopril 5 mg daily, diltiazem 240 mg daily Previously tried: none BP goal: <130/80  Family History:  Family History  Problem Relation Age of Onset   Diabetes Sister    Mental illness Sister    Heart disease Sister    Arthritis Other    Hypertension Other    Alcohol abuse Other    Heart attack Father    Stroke Father    Heart disease Other    Arthritis Other    Heart disease Other    Stroke Other    Mental illness Other    Kidney disease Other    Breast cancer Other        aunts   Colon cancer Neg Hx    Colon polyps  Neg Hx    Esophageal cancer Neg Hx    Rectal cancer Neg Hx    Stomach cancer Neg Hx      Social History: in the process of quitting--> 2 cigs per day// new years resolution   Diet: Patient is eating healthier, does not eat out, or eat fried foods   Exercise: Limited physical activity in the day with having to watch sister and take care of mother  Home BP readings: 136/64 HR 62, 134/66  Wt Readings from Last 3 Encounters:  06/08/22 143 lb (64.9 kg)  05/21/22 140 lb 9.6 oz (63.8 kg)  02/04/22 139 lb 12.8 oz (63.4 kg)   BP Readings from Last 3 Encounters:  06/30/22 134/66  06/08/22 (!) 152/87  05/21/22 (!) 148/84   Pulse Readings from Last 3 Encounters:  06/08/22 75  05/21/22 68  02/04/22 (!) 46    Renal function: CrCl cannot be calculated (Patient's most recent lab result is older than the maximum 21 days  allowed.).  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   right   Mild CAD    Mitral valve disease 03/25/1999   s/p mitral valve repair with commissuroplasty    NICM (nonischemic cardiomyopathy) ([place])    EF 40% in 2020, recovered   Rheumatic fever    Syncope    1999   Typical atrial flutter (HCC)    VSD (ventricular septal defect)    small    Current Outpatient Medications on File Prior to Visit  Medication Sig Dispense Refill   amoxicillin (AMOXIL) 500 MG capsule Take 4 capsules by mouth 30-60 minutes prior to dental work 4 capsule 3   apixaban (ELIQUIS) 5 MG TABS tablet Take 1 tablet (5 mg total) by mouth 2 (two) times daily. 180 tablet 1   aspirin-acetaminophen-caffeine (EXCEDRIN MIGRAINE) 250-250-65 MG tablet Take 1 tablet by mouth every 6 (six) hours as needed for headache.     diltiazem (CARDIZEM CD) 240 MG 24 hr capsule Take 1 capsule (240 mg total) by mouth daily. 90 capsule 1   pantoprazole (PROTONIX) 40 MG tablet Take 1 tablet (40 mg total) by mouth daily before breakfast. 30 tablet 1   rosuvastatin (CRESTOR) 20 MG tablet Take 1 tablet (20 mg total) by mouth daily. 90 tablet 3   No current facility-administered medications on file prior to visit.    Allergies  Allergen Reactions   Tensilon [Edrophonium] Other (See Comments)    Blacked out     Blood pressure 134/66.  Assessment/Plan:    Hypertension Assessment: Patient does not take blood pressure at home and did not bring cuff In office blood pressure was slightly above goal 134/66 Patient is eating healthier, does not eat out, or eat fried foods Limited physical activity in the day with having to watch sister and take care of mother Currently in the process of trying to quit smoking: has cut down to 2 cigarettes daily  Has been adherent with all his  medications   Plan Increase lisinopril to 10 mg daily  Follow up labs within 2 weeks on 07/14/2022 Follow up in person appointment on 08/10/2022 at 08:30AM Bring in blood pressure cuff and readings for next appointment to assess accuracy and home blood pressure Use proper technique when checking blood pressure: feet flat  on ground, back supported in chair, emptied bladder, arm at heart level, waiting 5 minutes prior to first read, if still high take blood pressure again within a few minutes after Continue to try and eat healthy, limiting salt intake Try to increase physical activity to 30 minutes daily: does not have to be all at once can break it up throughout the day and can try walking Continue to try and quit smoking and to reach out if want to try medications and to set an end date  Thank you,  Sandford Craze, PharmD. [facility] PGY-1 06/30/2022 11:56 AM   Ramond Dial, Pharm.D, BCPS, CPP  [facility] [address], [place], [place] [address]  Phone: [phone]; Fax: [phone]

## 2022-06-30 NOTE — Patient Instructions (Addendum)
Increase lisinopril from 5 mg to 10 mg  Start checking blood pressure at home and record bp readings and bring in Follow up lab work on 07/14/2022 Follow up appointment 08/10/2022 at 8:30AM

## 2022-07-14 ENCOUNTER — Other Ambulatory Visit: Payer: Self-pay

## 2022-07-15 ENCOUNTER — Other Ambulatory Visit: Payer: Self-pay

## 2022-07-16 ENCOUNTER — Telehealth: Payer: Self-pay | Admitting: Pharmacist

## 2022-07-16 NOTE — Telephone Encounter (Signed)
Patient missed lab apt. Called and LVM for him to call back and reschedule.

## 2022-07-22 ENCOUNTER — Other Ambulatory Visit: Payer: Self-pay

## 2022-07-22 ENCOUNTER — Other Ambulatory Visit: Payer: Self-pay | Admitting: Interventional Cardiology

## 2022-08-10 ENCOUNTER — Ambulatory Visit: Payer: Self-pay | Attending: Pharmacist | Admitting: Pharmacist

## 2022-08-10 NOTE — Progress Notes (Deleted)
Patient ID: Terry Golden.                 DOB: [birth year]                      MRN: [id number]      HPI: Terry Golden. is a 57 y.o. male referred by Dr. [name] to HTN clinic. 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 TEE showed an EF of 50-55% in June 2023 but MRI showed EF 42% in Aug 2023. Pt saw Dr [name] in Oct 2023 where BP was elevated at 148/84. He had run out of his medications recently and had restarted them 2 days prior to his appt. His rosuvastatin was increased from '10mg'$  to '20mg'$  daily to target LDL goal < 70 given aortic atherosclerosis noted on prior CT scan in 2021. Seen by PharmD on 11/28 where BP was 134/66 and lisinopril was increased to '10mg'$  daily.  Today patient presents to follow up with his blood pressure management. Patient states he has not been taking blood pressure at home, but does have a home cuff. He has been busy keeping up with his family and being with his mother in the hospital. He did not have any home blood pressure readings to get an adequate idea of what his blood pressure is running outside of clinic. He reports he has no issues with adhering to his medications and not missing any doses. He reports no signs/symptoms of low blood pressure (dizziness, fatigue) or high blood pressure (headaches). He is trying to eat healthier and stays away from fried foods, eating out, and states eats salads at home. He denies any salt use for his meals. Physical activity has been limited due to everything going on in his life right now. Rosuvastatin was increased at last appointment and patient  has no issues with current dose. He currently smokes 2 cigarettes a day and has been trying to quit. He does not want to use dual nicotine replacement therapy and is motivated to quit all together-believes its about mentality.  Needs labs - cmet and lipids today Can inc lisin or add spiro if needed Pt to bring in home cuff and readings today Update on smoking - 2 cigs per day at last visit end of Nov  Current HTN meds: lisinopril '10mg'$  daily, diltiazem '240mg'$  daily (aflutter) Previously tried: none BP goal: <130/68mHg  Family History: Father with MI and stroke. Sister with DM and heart disease.  Social History: in the process of quitting--> 2 cigs per day// new years resolution   Diet: Eating healthier, does not eat out or eat fried foods   Exercise: Limited physical activity with having to watch sister and take care of mother  Home BP readings:   Labs: 04/16/22: TC 161, TG 59, HDL 52, LDL 97 (rosuvastatin '10mg'$  daily)  Wt Readings from Last 3 Encounters:  06/08/22 143 lb (64.9 kg)  05/21/22 140 lb 9.6 oz (63.8 kg)  02/04/22 139 lb 12.8 oz (63.4 kg)   BP Readings from Last 3 Encounters:  06/30/22 134/66  06/08/22 (!) 152/87  05/21/22 (!) 148/84  Pulse Readings from Last 3 Encounters:  06/08/22 75  05/21/22 68  02/04/22 (!) 46    Renal function: CrCl cannot be calculated (Patient's most recent lab result is older than the maximum 21 days allowed.).  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 180 tablet 1   aspirin-acetaminophen-caffeine (EXCEDRIN MIGRAINE) 250-250-65 MG tablet Take 1 tablet by mouth every 6 (six) hours as needed for headache.     diltiazem (CARDIZEM CD) 240 MG 24 hr capsule Take 1 capsule (240 mg total) by mouth daily. 90 capsule 1   ferrous sulfate 325 (65 FE) MG tablet TAKE 1 TABLET BY MOUTH EVERY DAY WITH BREAKFAST 90 tablet 2   lisinopril (ZESTRIL) 10 MG tablet Take 1 tablet (10 mg total) by mouth daily. Dose increase. Stopping lisinopril 5 mg 30 tablet 3   pantoprazole (PROTONIX) 40 MG tablet Take 1 tablet (40 mg total) by mouth daily before breakfast. 30 tablet 1   rosuvastatin (CRESTOR) 20 MG tablet Take 1 tablet (20 mg total) by mouth daily. 90 tablet 3   No current facility-administered medications on file prior to visit.    Allergies  Allergen Reactions   Tensilon [Edrophonium] Other (See Comments)    Blacked out     There were no vitals taken for this visit.  Assessment/Plan:  Hypertension -   2. Hyperlipidemia - LDL previously 97 on rosuvastatin '10mg'$  daily above goal < 70 given aortic atherosclerosis noted on CT scan in 2021. Statin dose increased to '20mg'$  daily in Sept, will recheck labs today. Can further increase statin or add ezetimibe if needed to reach LDL goal.   Bradd Merlos E. Jermon Chalfant, PharmD, BCACP, [facility]. [address] [place], [place] [address] Phone: [phone]; Fax: [phone] 08/10/2022 7:20 AM

## 2022-12-21 ENCOUNTER — Other Ambulatory Visit: Payer: Self-pay | Admitting: Interventional Cardiology

## 2022-12-29 NOTE — Progress Notes (Unsigned)
Office Visit    Patient Name: Terry Golden. Date of Encounter: 12/31/2022  Primary Care Provider:  Barbette Merino, NP Primary Cardiologist:  Lance Muss, MD Primary Electrophysiologist: Hillis Range, MD (Inactive)   Past Medical History    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   right   Mild CAD    Mitral valve disease 03/25/1999   s/p mitral valve repair with commissuroplasty    NICM (nonischemic cardiomyopathy) (HCC)    EF 40% in 2020, recovered   Rheumatic fever    Syncope    1999   Typical atrial flutter (HCC)    VSD (ventricular septal defect)    small   Past Surgical History:  Procedure Laterality Date   ASD AND VSD REPAIR     BIOPSY  12/16/2020   Procedure: BIOPSY;  Surgeon: Iva Boop, MD;  Location: [facility] ENDOSCOPY;  Service: Gastroenterology;;   CARDIAC CATHETERIZATION N/A 09/20/2015   Procedure: Right/Left Heart Cath and Coronary Angiography;  Surgeon: Corky Crafts, MD;  Location: [facility] INVASIVE CV LAB;  Service: Cardiovascular;  Laterality: N/A;   CARDIAC SURGERY     s/p partial AVSD repair and cleft mitral valve repair with commissuroplasty   CARDIOVERSION N/A 12/07/2018   Procedure: CARDIOVERSION;  Surgeon: Lars Masson, MD;  Location: [facility] ENDOSCOPY;  Service: Cardiovascular;  Laterality: N/A;   COLONOSCOPY  07/08/2020   ESOPHAGOGASTRODUODENOSCOPY (EGD) WITH PROPOFOL N/A 12/16/2020   Procedure: ESOPHAGOGASTRODUODENOSCOPY (EGD) WITH PROPOFOL;  Surgeon: Iva Boop, MD;  Location: [facility] ENDOSCOPY;  Service: Gastroenterology;  Laterality: N/A;   LIPOMA EXCISION Right 06/19/2020   Procedure: EXCISION SUBCUTANEOUS LIPOMA RIGHT SHOULDER;  Surgeon: Manus Rudd, MD;  Location: [facility] OR;  Service: General;  Laterality: Right;   MITRAL VALVE REPAIR     TEE WITHOUT CARDIOVERSION N/A  12/07/2018   Procedure: TRANSESOPHAGEAL ECHOCARDIOGRAM (TEE);  Surgeon: Lars Masson, MD;  Location: [facility] ENDOSCOPY;  Service: Cardiovascular;  Laterality: N/A;   TEE WITHOUT CARDIOVERSION N/A 01/08/2022   Procedure: TRANSESOPHAGEAL ECHOCARDIOGRAM (TEE);  Surgeon: Meriam Sprague, MD;  Location: [facility] ENDOSCOPY;  Service: Cardiovascular;  Laterality: N/A;    Allergies  Allergies  Allergen Reactions   Tensilon [Edrophonium] Other (See Comments)    Blacked out      History of Present Illness    Terry Golden.  is a 57 year old male with a PMH of rheumatic fever, partial ASD and cleft mitral valve s/p ASVD repair and mitral valve repair in 03/1999, nonobstructive CAD s/p left heart cath 2017, PAF (on Eliquis), severe MR who presents today for 27-month follow-up.  Terry Golden was seen initially by Dr. Eldridge Dace in 2016 when he presented to the ED for complaint of worsening shortness of breath with lightheadedness.  He had previously been followed by cardiologist at [facility] and was previously followed by Dr. Ladona Ridgel but never resumed care.  He was initially diagnosed with ASVD at age 67 and underwent surgery 03/1999 for partial a VSD repair by Dr. Loleta Chance.  He underwent a left heart cath in 2017 that showed nonobstructive CAD.  He was diagnosed with atrial flutter with RVR in 12/2018 and underwent TEE guided cardioversion and was started on anticoagulation.  EF was 35-40% with dilated LA/RA and moderate MR. He had  an admission in 12/2020 for severe IDA with Hgb 3.9 requiring multiple transfusions .  He had an echo performed 12/2021 showing severe MR and EF of 50-55%mild LVH, moderately reduced RVF, mild RVE, severely dilated LA, & mildly dilated RA.  He underwent a TEE 01/2022 that showedmild mitral stenosis with mean gradient at HR 60bpm. There is a single papillary muscle visualized to which all mitral valve. chords appear to be attached. 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 There is moderate  mitral regurgitation.  He underwent a CMR on 03/2022 indicating moderate MR with mild TVR with mildly reduced EF of 42%.  He was last seen by Dr. Eldridge Dace on 05/2022.  He was referred to the structural heart team and also referred to [facility].D. for HTN management.  He was advised by the structural team to get a consultation by Dr. Leafy Ro which took place on 06/2022.  Dr. Leafy Ro concluded that patient would require replacement and recommended mechanical valve but due to patient's history of GI bleeds makes that prohibitive.  He recommended a RHC to assess filling pressures and pulmonary hypertension and if they were elevated would move forward with surgical intervention.  He recommended aggressive medical therapy with BP and diuretics.  Terry Golden presents today for 49-month follow-up alone.  Since last being seen in the office patient reports that he has been doing well but still experiencing episodes of frequent palpitations and shortness of breath with dizziness.  He reports that these symptoms occur frequently at least once to twice per week.  They are usually relieved with rest.  His blood pressure today is well-controlled at 132/76 and heart rate was 61 bpm.  He is compliant with his current medications and denies any adverse reactions.  During today's visit we discussed his recommendations from Dr. Leafy Ro regarding right heart catheterization.  We discussed the pathophysiology of valve disease and patient had all questions answered to his satisfaction.  Patient denies chest pain, palpitations, dyspnea, PND, orthopnea, nausea, vomiting, dizziness, syncope, edema, weight gain, or early satiety.   Home Medications    Current Outpatient Medications  Medication Sig Dispense Refill   amoxicillin (AMOXIL) 500 MG capsule Take 4 capsules by mouth 30-60 minutes prior to dental work 4 capsule 3   apixaban (ELIQUIS) 5 MG TABS tablet Take 1 tablet (5 mg total) by mouth 2 (two) times daily. 180 tablet 1    aspirin-acetaminophen-caffeine (EXCEDRIN MIGRAINE) 250-250-65 MG tablet Take 1 tablet by mouth every 6 (six) hours as needed for headache.     ferrous sulfate 325 (65 FE) MG tablet TAKE 1 TABLET BY MOUTH EVERY DAY WITH BREAKFAST 90 tablet 2   lisinopril (ZESTRIL) 10 MG tablet TAKE 1 TABLET (10 MG TOTAL) BY MOUTH DAILY. DOSE INCREASE. STOPPING LISINOPRIL 5 MG 30 tablet 4   pantoprazole (PROTONIX) 40 MG tablet Take 1 tablet (40 mg total) by mouth daily before breakfast. 30 tablet 1   rosuvastatin (CRESTOR) 20 MG tablet Take 1 tablet (20 mg total) by mouth daily. 90 tablet 3   diltiazem (CARDIZEM CD) 240 MG 24 hr capsule Take 1 capsule (240 mg total) by mouth daily. 90 capsule 3   No current facility-administered medications for this visit.     Review of Systems  Please see the history of present illness.    (+)Fatigue (+)SOB  All other systems reviewed and are otherwise negative except as noted above.  Physical Exam    Wt Readings from Last 3 Encounters:  12/31/22 139 lb 12.8  oz (63.4 kg)  06/08/22 143 lb (64.9 kg)  05/21/22 140 lb 9.6 oz (63.8 kg)   VS: Vitals:   12/31/22 0919  BP: 132/76  Pulse: 61  SpO2: 96%  ,Body mass index is 20.64 kg/m.  Constitutional:      Appearance: Healthy appearance. Not in distress.  Neck:     Vascular: JVD normal.  Pulmonary:     Effort: Pulmonary effort is normal.     Breath sounds: No wheezing. No rales. Diminished in the bases Cardiovascular:     Irregularly Irregular Normal S1. Normal S2.      Murmurs: Soft systolic murmur Edema:    Peripheral edema absent.  Abdominal:     Palpations: Abdomen is soft non tender. There is no hepatomegaly.  Skin:    General: Skin is warm and dry.  Neurological:     General: No focal deficit present.     Mental Status: Alert and oriented to person, place and time.     Cranial Nerves: Cranial nerves are intact.  EKG/LABS/ Recent Cardiac Studies    ECG personally reviewed by me today -atrial flutter  with variable AV block and left anterior fascicular block with possible LVH and rate of 84 bpm with no acute changes.  Cardiac Studies & Procedures   CARDIAC CATHETERIZATION  CARDIAC CATHETERIZATION 09/20/2015  Narrative  Mid LAD lesion, 20% stenosed. Minimal, nonobstructive CAD.  The left ventricular systolic function is normal. No interventricular shunt noted.  Normal right heart pressures. PA sat 66%. CO 4.1 L/min. CI 2.2.  Continue medical therapy.  Serial echocardiograms to follow mitral valve disease.  Risk factor modification including avoiding tobacco.  Findings Coronary Findings Diagnostic  Dominance: Right  Left Anterior Descending  Left Circumflex The vessel exhibits minimal luminal irregularities.  Intervention  No interventions have been documented.   STRESS TESTS  ECHOCARDIOGRAM STRESS TEST 09/27/2015  Narrative *[facility] [address] [place], [place] [address] [phone]  ------------------------------------------------------------------- Stress Echocardiography  Patient:    Khalique, Borg MR #:       [id number] Study Date: 08/19/2015 Gender:     M Age:        50 Height:     175.3 cm Weight:     64.1 kg BSA:        1.76 m^2 Pt. Status: Room:  SONOGRAPHER  Dewitt Hoes, RDCS PERFORMING   [facility], Outpatient ATTENDING    Azalee Course [id number]     NUUV, Wynema Birch [id number] Janae Sauce [id number]  cc:  -------------------------------------------------------------------  ------------------------------------------------------------------- Indications:      Chest pain (R07.89).  Shortness of breath (R06.09).  Mitral Regurgitation (I34.0). Stress for mitral regurgitation evaluation.  ------------------------------------------------------------------- Impressions:  - Test was originally ordered to evaluate for wall motion abnormalities.Patient exercised into stage 4 but heart rate was only 112 and he was not close to his  target heart rate (PMHR 171). Therefore the study was changed. He exercise a second time and this time achieved a heart rate 139 at the end of stage 4, representing 81% of PMHR. This time his mitral regurgitation was evaluated with exercise. The patient has moderate mitral regurgitation at rest. This does not appear to change appreciably with treadmill stress. MR volume at heart rate of 75 bpm ranges between 23-30 ml.  Bruce protocol. Stress echocardiography.  Birthdate:  Patient birthdate: 1965/09/24.  Age:  Patient is 57 yr old.  Sex:  Gender: male.    BMI: 20.9 kg/m^2.  Patient  status:  Outpatient.  Study date:  Study date: 08/19/2015. Study time: 07:30 AM.  -------------------------------------------------------------------  ------------------------------------------------------------------- Stress protocol:  +---------------------+---+------------+----------------+--------+ Stage                HR BP (mmHg)   Rhythm          Symptoms +---------------------+---+------------+----------------+--------+ Baseline             52 125/82 (96) ----------------None     +---------------------+---+------------+----------------+--------+ Stage 1              82 139/80 (100)------------------------ +---------------------+---+------------+----------------+--------+ Stage 2              88 140/77 (98) ------------------------ +---------------------+---+------------+----------------+--------+ Stage 3              657846/96 (100)Occasional PVC&'s-------- +---------------------+---+------------+----------------+--------+ Stage 4              131119/79 (92) ------------------------ +---------------------+---+------------+----------------+--------+ Immediate post stress137------------------------------------ +---------------------+---+------------+----------------+--------+ Recovery; 1 min      93 132/76 (95)  ------------------------ +---------------------+---+------------+----------------+--------+ Recovery; 2 min      69 ------------------------------------ +---------------------+---+------------+----------------+--------+ Recovery; 3 min      68 137/74 (95) ------------------------ +---------------------+---+------------+----------------+--------+ Recovery; 5 min      67 131/71 (91) ------------------------ +---------------------+---+------------+----------------+--------+  ------------------------------------------------------------------- Stress results:   Maximal heart rate during stress was 137 bpm (81% of maximal predicted heart rate). The maximal predicted heart rate was 170 bpm.The target heart rate was not achieved. The heart rate response to stress was normal. There was a normal resting blood pressure with an appropriate response to stress. The rate-pressure product for the peak heart rate and blood pressure was 29528 mm Hg/min.  ------------------------------------------------------------------- Baseline:  Peak stress:  ------------------------------------------------------------------- Measurements  Mitral valve                    Value Mitral regurg VTI, PISA         236   cm Mitral ERO, PISA                0.15  cm^2 Mitral regurg volume, PISA      35    ml  Legend: (L)  and  (H)  mark values outside specified reference range.  ------------------------------------------------------------------- Prepared and Electronically Authenticated by  Cassell Clement, MD 2017-01-16T17:43:52   ECHOCARDIOGRAM  ECHOCARDIOGRAM COMPLETE 12/26/2021  Narrative ECHOCARDIOGRAM REPORT    Patient Name:   Terry Golden. Date of Exam: 12/26/2021 Medical Rec #:  [id number]           Height:       69.0 in Accession #:    [id number]          Weight:       139.8 lb Date of Birth:  1966-04-13            BSA:          1.774 m Patient Age:    56 years             BP:           134/74 mmHg Patient Gender: M                   HR:           56 bpm. Exam Location:  [address]  Procedure: 2D Echo, Cardiac Doppler, Color Doppler and 3D Echo  Indications:    Congential Heart Disease Q24.0  History:        Patient has prior history of Echocardiogram examinations, most recent 04/17/2019.  Non-ischemic Cardiomyopathy, Mitral Valve Disease; Arrythmias:Atrial Flutter. Septal Repair:s/p ASD & VSD Repair. 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  Mitral Valve: commisuroplasty valve is present in the mitral position. Procedure Date: 03/25/1999.  Sonographer:    Thurman Coyer RDCS Referring Phys: [id number] DAYNA N DUNN  IMPRESSIONS   1. 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 There is severe MV regurgitation that is central. The leaflets are thickened with restricted movement in systole/diastole possibly related to rheumatic heart disease. Only 1 prominent papillary muscle is seen so parachute-like mitral valve is also a possibility given congenital heart history. MG 3 mmHG @ 55 bpm. There is systolic flow reversal in the right inferior PV. Would recommend a TEE for calrification. The mitral valve is abnormal. Severe mitral valve regurgitation. Mild mitral stenosis. There is a commisuroplasty present in the mitral position. Procedure Date: 03/25/1999. 2. Left ventricular ejection fraction, by estimation, is 50 to 55%. The left ventricle has low normal function. The left ventricle has no regional wall motion abnormalities. There is mild concentric left ventricular hypertrophy. Left ventricular diastolic function could not be evaluated. 3. Right ventricular systolic function is moderately reduced. The right ventricular size is mildly enlarged. Tricuspid regurgitation signal is inadequate for  assessing PA pressure. 4. Left atrial size was severely dilated. 5. History of Primum ASD repair. 6. Right atrial size was mildly dilated. 7. The aortic valve is tricuspid. Aortic valve regurgitation is not visualized. No aortic stenosis is present. 8. The inferior vena cava is normal in size with greater than 50% respiratory variability, suggesting right atrial pressure of 3 mmHg.  Comparison(s): Changes from prior study are noted. MR is severe. EF ~50%.  FINDINGS Left Ventricle: Left ventricular ejection fraction, by estimation, is 50 to 55%. The left ventricle has low normal function. The left ventricle has no regional wall motion abnormalities. The left ventricular internal cavity size was normal in size. There is mild concentric left ventricular hypertrophy. Abnormal (paradoxical) septal motion consistent with post-operative status. Left ventricular diastolic function could not be evaluated.  Right Ventricle: The right ventricular size is mildly enlarged. No increase in right ventricular wall thickness. Right ventricular systolic function is moderately reduced. Tricuspid regurgitation signal is inadequate for assessing PA pressure.  Left Atrium: Left atrial size was severely dilated.  Right Atrium: Right atrial size was mildly dilated.  Pericardium: There is no evidence of pericardial effusion.  Mitral Valve: History of primum ASD with MV cleft status post repair. There is severe MV regurgitation that is central. The leaflets are thickened with restricted movement in systole/diastole possibly related to rheumatic heart disease. Only 1 prominent papillary muscle is seen so parachute-like mitral valve is also a possibility given congenital heart history. MG 3 mmHG @ 55 bpm. There is systolic flow reversal in the right inferior PV. Would recommend a TEE for calrification. The mitral valve is abnormal. Severe mitral valve regurgitation. There is a commisuroplasty present in the mitral  position. Procedure Date: 03/25/1999. Mild mitral valve stenosis. MV peak gradient, 10.5 mmHg. The mean mitral valve gradient is 3.0 mmHg with average heart rate of 55 bpm.  Tricuspid Valve: The tricuspid valve is grossly normal. Tricuspid valve regurgitation is mild . No evidence of tricuspid stenosis.  Aortic Valve: The aortic valve is tricuspid. Aortic valve regurgitation is not visualized. No aortic stenosis is present.  Pulmonic Valve: The pulmonic valve was grossly normal. Pulmonic valve regurgitation is trivial. No evidence of pulmonic stenosis.  Aorta: The aortic root and ascending aorta are structurally normal, with no evidence of dilitation.  Venous: The inferior vena cava is normal in size with greater than 50% respiratory variability, suggesting right atrial pressure of 3 mmHg.  IAS/Shunts: The atrial septum is grossly normal.   LEFT VENTRICLE PLAX 2D LVIDd:         5.30 cm LVIDs:         3.80 cm LV PW:         1.20 cm LV IVS:        1.20 cm LVOT diam:     2.00 cm      3D Volume EF: LV SV:         105          3D EF:        50 % LV SV Index:   59           LV EDV:       167 ml LVOT Area:     3.14 cm     LV ESV:       83 ml LV SV:        84 ml  LV Volumes (MOD) LV vol d, MOD A2C: 124.0 ml LV vol d, MOD A4C: 119.0 ml LV vol s, MOD A2C: 68.1 ml LV vol s, MOD A4C: 59.0 ml LV SV MOD A2C:     55.9 ml LV SV MOD A4C:     119.0 ml LV SV MOD BP:      58.4 ml  RIGHT VENTRICLE RV Basal diam:  4.20 cm RV Mid diam:    2.70 cm RV S prime:     10.90 cm/s TAPSE (M-mode): 1.5 cm  LEFT ATRIUM              Index        RIGHT ATRIUM           Index LA diam:        3.80 cm  2.14 cm/m   RA Area:     16.50 cm LA Vol (A2C):   143.0 ml 80.59 ml/m  RA Volume:   53.20 ml  29.98 ml/m LA Vol (A4C):   75.8 ml  42.72 ml/m LA Biplane Vol: 104.0 ml 58.61 ml/m AORTIC VALVE LVOT Vmax:   155.00 cm/s LVOT Vmean:  101.000 cm/s LVOT VTI:    0.335 m  AORTA Ao Root diam: 3.40 cm Ao Asc  diam:  3.40 cm  MITRAL VALVE MV Area (PHT): 2.22 cm       SHUNTS MV Area VTI:   1.97 cm       Systemic VTI:  0.34 m MV Peak grad:  10.5 mmHg      Systemic Diam: 2.00 cm MV Mean grad:  3.0 mmHg MV Vmax:       1.62 m/s MV Vmean:      78.0 cm/s MV Decel Time: 342 msec MR Peak grad:    190.4 mmHg MR Mean grad:    114.0 mmHg MR Vmax:         690.00 cm/s MR Vmean:        500.0 cm/s MR PISA:         2.65 cm MR PISA Eff ROA: 15 mm MR PISA Radius:  0.65 cm MV E velocity: 132.00 cm/s MV A velocity: 124.00 cm/s MV E/A ratio:  1.06  Lennie Odor MD Electronically signed by Lennie Odor MD Signature Date/Time: 12/26/2021/10:58:45 AM    Final   TEE  ECHO TEE 01/08/2022  Narrative TRANSESOPHOGEAL ECHO REPORT    Patient Name:   Terry Golden. Date of Exam: 01/08/2022 Medical Rec #:  [id number]           Height:       69.0 in Accession #:    [id number]          Weight:       140.0 lb Date of Birth:  02-27-1966            BSA:          1.775 m Patient Age:    56 years            BP:           153/82 mmHg Patient Gender: M                   HR:           63 bpm. Exam Location:  Inpatient  Procedure: 3D Echo, Transesophageal Echo, Cardiac Doppler and Color Doppler  Indications:     I34.0 Nonrheumatic mitral (valve) insufficiency  History:         Patient has prior history of Echocardiogram examinations, most recent 12/26/2021. Cardiomyopathy; Mitral Valve Disease. Cleft mitral valve commisuroplasty. ASD VSD.  Sonographer:     Sheralyn Boatman RDCS Referring Phys:  [id number] Tacey Ruiz DUNN Diagnosing Phys: Laurance Flatten MD  PROCEDURE: After discussion of the risks and benefits of a TEE, an informed consent was obtained from the patient. The transesophogeal probe was passed without difficulty through the esophogus of the patient. Imaged were obtained with the patient in a left lateral decubitus position. Sedation performed by different physician. The patient was monitored while under  deep sedation. Anesthestetic sedation was provided intravenously by Anesthesiology: 589mg  of Propofol. The patient's vital signs; including heart rate, blood pressure, and oxygen saturation; remained stable throughout the procedure. The patient developed no complications during the procedure.  IMPRESSIONS   1. History of primum ASD with MV cleft s/p repair. The mitral valve leaflets appear thickened and restricted. There is mild mitral stenosis with mean gradient at HR 60bpm. There is a single papillary muscle visualized to which all mitral valve chords appear to be attached. 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 There is moderate mitral regurgitation by PISA with EROA 0.24cm2, RVol 52mL. 3D VC 0.3cm. There is systolic blunting in the LUPV. During interrogation of the RUPV and RLPV the patient appeared to enter a junctional rhythm with a short atrial reversal wave without true systolic flow reversal visualized (although systolic flow reversal was clearly seen on surface echo). Given discrepant values and possible underestimation of MR on current study (may be related to lower blood pressures in the setting of sedation), recommend CMR for further evaluation. Could also consider RHC to assess PA pressures. 2. Left ventricular ejection fraction, by estimation, is 50 to 55%. The left ventricle has low normal function. 3. Patient is s/p VSD repair with no residual shunting visualized by color doppler. 4. Right ventricular systolic function is moderately reduced. The right ventricular size is mildly enlarged. 5. Left atrial size was severely dilated. No left atrial/left atrial appendage thrombus was detected. 6. Right atrial size was severely dilated. 7. The patient is s/p primum ASD repair. There appears to be two tricuspid leaflets present following repair. One of  the leaflets is elongated and overriding compared to the other. There is mild tricuspid regurgitation. There is no tricuspid  stenosis. The tricuspid valve is abnormal. 8. The aortic valve is tricuspid. Aortic valve regurgitation is not visualized. No aortic stenosis is present. 9. Patient is s/p primum ASD repair. No residual shunting detected by color flow doppler.  FINDINGS Left Ventricle: Left ventricular ejection fraction, by estimation, is 50 to 55%. The left ventricle has low normal function. The left ventricular internal cavity size was normal in size.  Right Ventricle: The right ventricular size is mildly enlarged. No increase in right ventricular wall thickness. Right ventricular systolic function is moderately reduced.  Left Atrium: Left atrial size was severely dilated. No left atrial/left atrial appendage thrombus was detected.  Right Atrium: Right atrial size was severely dilated.  Pericardium: There is no evidence of pericardial effusion.  Mitral Valve: History of primum ASD with MV cleft s/p repair. The mitral valve leaflets appear thickened and restricted. There is mild mitral stenosis with mean gradient at HR 60bpm. There is a single papillary muscle visualized to which all mitral valve chords appear to be attached. 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 There is moderate mitral regurgitation by PISA with EROA 0.24cm2, RVol 52mL. 3D VC 0.3cm. There is systolic blunting in the LUPV. During interrogation of the RUPV and RLPV the patient appeared to enter a junctional rhythm with a short atrial reversal wave without true systolic flow reversal visualized (although systolic flow reversal was clearly seen on surface echo). Given discrepant values and possible underestimation of MR on current study (may be related to lower blood pressures in the setting of sedation), recommend CMR for further evaluation. Could also consider RHC to assess PA pressures. The mitral valve has been repaired/replaced. MV peak gradient, 13.0 mmHg. The mean mitral valve gradient is 3.0 mmHg.  Tricuspid Valve: The  patient is s/p primum ASD repair. There appears to be two tricuspid leaflets present following repair. One of the leaflets is elongated and overriding compared to the other. There is mild tricuspid regurgitation. There is no tricuspid stenosis. The tricuspid valve is abnormal. Tricuspid valve regurgitation is mild.  Aortic Valve: The aortic valve is tricuspid. Aortic valve regurgitation is not visualized. No aortic stenosis is present.  Pulmonic Valve: The pulmonic valve was normal in structure. Pulmonic valve regurgitation is trivial.  Aorta: The aortic root is normal in size and structure.  IAS/Shunts: Patient is s/p primum ASD repair. No residual shunting detected by color flow doppler.   MITRAL VALVE                  TRICUSPID VALVE MV Peak grad: 13.0 mmHg       TR Peak grad:   23.8 mmHg MV Mean grad: 3.0 mmHg        TR Vmax:        244.00 cm/s MV Vmax:      1.80 m/s MV Vmean:     82.0 cm/s MR Peak grad:    168.0 mmHg MR Mean grad:    102.0 mmHg MR Vmax:         648.00 cm/s MR Vmean:        467.0 cm/s MR PISA:         3.08 cm MR PISA Eff ROA: 18 mm MR PISA Radius:  0.70 cm  Laurance Flatten MD Electronically signed by Laurance Flatten MD Signature Date/Time: 01/08/2022/8:14:39 PM    Final    CT SCANS  CT CORONARY MORPH W/CTA COR W/SCORE 09/11/2015  Addendum 09/11/2015  6:04 PM ADDENDUM REPORT: 09/11/2015 18:01 CLINICAL DATA:  Chest pain EXAM: Cardiac CTA MEDICATIONS: None TECHNIQUE: The patient was scanned on a Philips 256 slice scanner. Gantry rotation speed was 270 msecs. Collimation was .9mm. A 120 kV retrospective scan was triggered in the descending thoracic aorta at 111 HU's . The 3D data set was interpreted on a dedicated work station using MPR, MIP and VRT modes. A total of 80cc of contrast was used. FINDINGS: Non-cardiac: See separate report from [facility]. Coronary Arteries: Right dominant with no anomalies LM: Normal LAD: Heavily  calcified ostial proximal and mid vessel. Blooming artifact and motion from PAC;s degrades image. 50% or possibly greater stenosis ostial LAD proximal LAD. Less than 50% calcified stenosis In the mid LAD D1: Normal D2: Normal D3: Normal Circumflex:  Small not well seen RCA:  Dominant and normal PDA: Normal PLA:  Normal IMPRESSION: 1) Difficult study to interpret due to PAC;s and extensive calcium in LAD. Possible greater than 50% stenosis in ostial/proximal LAD. Circumflex not well seen RCA dominant and normal Given location of lesion, family history and chest pain would consider diagnostic heart catheterization 2) Moderate biatrial enlargement 3) No residual ASD/VSD?  Area of patch repair near membranous septum 4)?  Suture repair of clear anterior mitral leaflet 5) Moderate RV enlargement 6) Dilated left main pulmonary artery 31 mm Charlton Haws Electronically Signed By: Charlton Haws M.D. On: 09/11/2015 18:01  Narrative EXAM: OVER-READ INTERPRETATION  CT CHEST  The following report is an over-read performed by radiologist Dr. Royal Piedra [facility] on 09/11/2015. This over-read does not include interpretation of cardiac or coronary anatomy or pathology. The coronary calcium score/coronary CTA interpretation by the cardiologist is attached.  COMPARISON:  No priors.  FINDINGS: 4 mm subpleural nodule associated with the minor fissure in the superior aspect of the right middle lobe (image 67 of series 412), likely to represent a subpleural lymph node. Nodular appearing 2.2 x 2.1 cm area of cystic lucencies and septal thickening in the right lower lobe (image 89 of series 412). No acute consolidative airspace disease, pneumothorax or pleural effusion in the visualized portions of the thorax. Post infectious or inflammatory scarring in the right middle lobe. Moderate centrilobular and paraseptal emphysema. Although the pulmonic trunk is normal in caliber  (23 mm) the right and left main pulmonary arteries are markedly dilated (29 mm and 32 mm in diameter respectively). Visualized portions of the upper abdomen are unremarkable. Median sternotomy wires. There are no aggressive appearing lytic or blastic lesions noted in the visualized portions of the skeleton.  IMPRESSION: 1. Sub solid nodular area of cystic change and septal thickening in the right lower lobe measuring 2.1 x 2.3 cm. Initial follow-up by chest CT without contrast is recommended in 3 months to confirm persistence. This recommendation follows the consensus statement: Recommendations for the Management of Subsolid Pulmonary Nodules Detected at CT: A Statement from the Fleischner Society as published in Radiology 2013; 266:304-317. 2. Mild diffuse bronchial thickening with moderate centrilobular and paraseptal emphysema; imaging findings suggestive of underlying COPD. 3. Dilatation of the main pulmonary arteries bilaterally, which may suggest pulmonary arterial hypertension.  Electronically Signed: By: Trudie Reed M.D. 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  EXAM:  CARDIAC MRI  TECHNIQUE: The patient was scanned on a 1.5 Tesla Siemens magnet. A dedicated cardiac coil was used. Functional imaging was done using Fiesta sequences. 2,3, and 4 chamber views were done to assess for RWMA's. Modified Simpson's rule using a short axis stack was used to calculate an ejection fraction on a dedicated work [employer]. The patient received 7 cc of Gadavist. After 10 minutes inversion recovery sequences were used to assess for infiltration and scar tissue. Phase contrast velocity mapping was performed.  CONTRAST:  7 cc  of Gadavist  FINDINGS: Left ventricle:  -Mild  dilatation  -Mild systolic dysfunction  -Normal ECV (28%)  -Normal T2 values  -RV insertion site LGE  LV EF: 42% (Normal 56-78%)  Absolute volumes:  LV EDV: (Normal 77-195 mL)  LV ESV: (Normal 19-72 mL)  LV SV: 90mL (Normal 51-133 mL)  CO: 4.5L/min (Normal 2.8-8.8 L/min)  Indexed volumes:  LV EDV: 178mL/sq-m (Normal 47-92 mL/sq-m)  LV ESV: 91mL/sq-m (Normal 13-30 mL/sq-m)  LV SV: 32mL/sq-m (Normal 32-62 mL/sq-m)  CI: 2.6L/min/sq-m (Normal 1.7-4.2 L/min/sq-m)  Right ventricle: Mild dilatation with mild systolic dysfunction  RV EF:  21% (Normal 47-74%)  Absolute volumes:  RV EDV: (Normal 88-227 mL)  RV ESV: (Normal 23-103 mL)  RV SV: 85mL (Normal 52-138 mL)  CO: 4.3L/min (Normal 2.8-8.8 L/min)  Indexed volumes:  RV EDV: 159mL/sq-m (Normal 55-105 mL/sq-m)  RV ESV: 50mL/sq-m (Normal 15-43 mL/sq-m)  RV SV: 76mL/sq-m (Normal 32-64 mL/sq-m)  CI: 2.4L/min/sq-m (Normal 1.7-4.2 L/min/sq-m)  Left atrium: Mild enlargement. S/p atrioventricular septal defect repair. Qp/Qs 1.0  Right atrium: Normal size  Mitral valve: Moderate regurgitation (regurgitant fraction 20%)  Aortic valve: No regurgitation  Tricuspid valve: Mild regurgitation (regurgitant fraction 14%)  Pulmonic valve: No regurgitation  Aorta: Normal proximal ascending aorta  Pericardium: Normal  IMPRESSION: 1.  Mild LV dilatation with mild systolic dysfunction (EF 42%)  2.  Mild RV dilatation with mild systolic dysfunction (EF 41%)  3. Status post atrioventricular septal defect repair. Qp/Qs 1.0, indicating no residual shunt  4.  Moderate mitral regurgitation (regurgitant fraction 20%)  5.  Mild tricuspid regurgitation (regurgitant fraction 14%)   Electronically Signed By: Epifanio Lesches M.D. On: 03/24/2022 14:14         Risk Assessment/Calculations:    CHA2DS2-VASc Score = 2   This indicates a 2.2% annual risk of stroke. The patient's score is  based upon: CHF History: 1 HTN History: 1 Diabetes History: 0 Stroke History: 0 Vascular Disease History: 0 Age Score: 0 Gender Score: 0           Lab Results  Component Value Date   WBC 6.0 02/04/2022   HGB 12.2 (L) 02/04/2022   HCT 37.1 (L) 02/04/2022   MCV 98 (H) 02/04/2022   PLT 229 02/04/2022   Lab Results  Component Value Date   CREATININE 0.95 02/04/2022   BUN 14 02/04/2022   NA 141 02/04/2022   K 3.9 02/04/2022   CL 105 02/04/2022   CO2 23 02/04/2022   Lab Results  Component Value Date   ALT 9 04/16/2022   AST 19 04/16/2022   ALKPHOS 61 04/16/2022   BILITOT 0.2 04/16/2022   Lab Results  Component Value Date   CHOL 161 04/16/2022   HDL 52 04/16/2022   LDLCALC 97 04/16/2022   TRIG 59 04/16/2022   CHOLHDL 3.1 04/16/2022    Lab Results  Component Value Date   HGBA1C 5.1 12/04/2018  Assessment & Plan    Congenital heart disease: -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 -Patient was evaluated by structural heart team who recommended evaluation by Dr. Leafy Ro. -Today patient reports that symptoms have continued and is still experiencing shortness of breath and dyspnea on exertion. -We will have patient complete right heart catheterization to measure right-sided heart pressures. -BMET and CBC today -Patient will hold Eliquis 2 days prior to scheduled procedure and should resume postprocedure once hemostasis is achieved. -SBE prophylaxis discussed -Continue lisinopril 10 mg  2.  Mild nonobstructive CAD: -s/p LHC in 2017 with nonobstructive CAD noted. -Patient reports no chest pain since previous visit. -Continue Crestor 20 mg daily  3.  History of atrial flutter: -Today patient is asymptomatic and controlled a flutter. -Patient's last creatinine was 0.9 and hemoglobin was 12.2. -Continue Eliquis 5 mg twice daily -Due to patient being asymptomatic and ongoing symptoms we will have him complete 14-day ZIO monitor to  evaluate flutter burden -Continue Cardizem 240 mg daily  4.  Essential hypertension: -Patient's blood pressure today was controlled at 132/76 -Continue lisinopril 10 mg daily  5. Fatigue/SOB: -Patient reports ongoing fatigue and shortness of breath that is frequent and ongoing since previous visit. -Patient will have right heart cath as noted above and was advised to contact us if fatigue and shortness of breath continues.   Disposition: Follow-up with Lance Muss, MD or APP in 3 months Shared Decision Making/Informed Consent The risks [stroke (1 in 1000), death (1 in 1000), kidney failure [usually temporary] (1 in 500), bleeding (1 in 200), allergic reaction [possibly serious] (1 in 200)], benefits (diagnostic support and management of coronary artery disease) and alternatives of a cardiac catheterization were discussed in detail with Mr. Hanby and he is willing to proceed.   Medication Adjustments/Labs and Tests Ordered: Current medicines are reviewed at length with the patient today.  Concerns regarding medicines are outlined above.   Signed, Napoleon Form, Leodis Rains, NP 12/31/2022, 11:33 AM [facility]

## 2022-12-29 NOTE — H&P (View-Only) (Signed)
Office Visit    Patient Name: Terry Golden. Date of Encounter: 12/31/2022  Primary Care Provider:  Barbette Merino, NP Primary Cardiologist:  Lance Muss, MD Primary Electrophysiologist: Hillis Range, MD (Inactive)   Past Medical History    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   right   Mild CAD    Mitral valve disease 03/25/1999   s/p mitral valve repair with commissuroplasty    NICM (nonischemic cardiomyopathy) (HCC)    EF 40% in 2020, recovered   Rheumatic fever    Syncope    1999   Typical atrial flutter (HCC)    VSD (ventricular septal defect)    small   Past Surgical History:  Procedure Laterality Date   ASD AND VSD REPAIR     BIOPSY  12/16/2020   Procedure: BIOPSY;  Surgeon: Iva Boop, MD;  Location: [facility] ENDOSCOPY;  Service: Gastroenterology;;   CARDIAC CATHETERIZATION N/A 09/20/2015   Procedure: Right/Left Heart Cath and Coronary Angiography;  Surgeon: Corky Crafts, MD;  Location: [facility] INVASIVE CV LAB;  Service: Cardiovascular;  Laterality: N/A;   CARDIAC SURGERY     s/p partial AVSD repair and cleft mitral valve repair with commissuroplasty   CARDIOVERSION N/A 12/07/2018   Procedure: CARDIOVERSION;  Surgeon: Lars Masson, MD;  Location: [facility] ENDOSCOPY;  Service: Cardiovascular;  Laterality: N/A;   COLONOSCOPY  07/08/2020   ESOPHAGOGASTRODUODENOSCOPY (EGD) WITH PROPOFOL N/A 12/16/2020   Procedure: ESOPHAGOGASTRODUODENOSCOPY (EGD) WITH PROPOFOL;  Surgeon: Iva Boop, MD;  Location: [facility] ENDOSCOPY;  Service: Gastroenterology;  Laterality: N/A;   LIPOMA EXCISION Right 06/19/2020   Procedure: EXCISION SUBCUTANEOUS LIPOMA RIGHT SHOULDER;  Surgeon: Manus Rudd, MD;  Location: [facility] OR;  Service: General;  Laterality: Right;   MITRAL VALVE REPAIR     TEE WITHOUT CARDIOVERSION N/A  12/07/2018   Procedure: TRANSESOPHAGEAL ECHOCARDIOGRAM (TEE);  Surgeon: Lars Masson, MD;  Location: [facility] ENDOSCOPY;  Service: Cardiovascular;  Laterality: N/A;   TEE WITHOUT CARDIOVERSION N/A 01/08/2022   Procedure: TRANSESOPHAGEAL ECHOCARDIOGRAM (TEE);  Surgeon: Meriam Sprague, MD;  Location: [facility] ENDOSCOPY;  Service: Cardiovascular;  Laterality: N/A;    Allergies  Allergies  Allergen Reactions   Tensilon [Edrophonium] Other (See Comments)    Blacked out      History of Present Illness    Terry Dame.  is a 57 year old male with a PMH of rheumatic fever, partial ASD and cleft mitral valve s/p ASVD repair and mitral valve repair in 03/1999, nonobstructive CAD s/p left heart cath 2017, PAF (on Eliquis), severe MR who presents today for 26-month follow-up.  Terry Golden was seen initially by Dr. Eldridge Dace in 2016 when he presented to the ED for complaint of worsening shortness of breath with lightheadedness.  He had previously been followed by cardiologist at [facility] and was previously followed by Dr. Ladona Ridgel but never resumed care.  He was initially diagnosed with ASVD at age 23 and underwent surgery 03/1999 for partial a VSD repair by Dr. Loleta Chance.  He underwent a left heart cath in 2017 that showed nonobstructive CAD.  He was diagnosed with atrial flutter with RVR in 12/2018 and underwent TEE guided cardioversion and was started on anticoagulation.  EF was 35-40% with dilated LA/RA and moderate MR. He had  an admission in 12/2020 for severe IDA with Hgb 3.9 requiring multiple transfusions .  He had an echo performed 12/2021 showing severe MR and EF of 50-55%mild LVH, moderately reduced RVF, mild RVE, severely dilated LA, & mildly dilated RA.  He underwent a TEE 01/2022 that showedmild mitral stenosis with mean gradient at HR 60bpm. There is a single papillary muscle visualized to which all mitral valve. chords appear to be attached. 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 There is moderate  mitral regurgitation.  He underwent a CMR on 03/2022 indicating moderate MR with mild TVR with mildly reduced EF of 42%.  He was last seen by Dr. Eldridge Dace on 05/2022.  He was referred to the structural heart team and also referred to [facility] for HTN management.  He was advised by the structural team to get a consultation by Dr. Leafy Ro which took place on 06/2022.  Dr. Leafy Ro concluded that patient would require replacement and recommended mechanical valve but due to patient's history of GI bleeds makes that prohibitive.  He recommended a RHC to assess filling pressures and pulmonary hypertension and if they were elevated would move forward with surgical intervention.  He recommended aggressive medical therapy with BP and diuretics.  Terry Golden presents today for 102-month follow-up alone.  Since last being seen in the office patient reports that he has been doing well but still experiencing episodes of frequent palpitations and shortness of breath with dizziness.  He reports that these symptoms occur frequently at least once to twice per week.  They are usually relieved with rest.  His blood pressure today is well-controlled at 132/76 and heart rate was 61 bpm.  He is compliant with his current medications and denies any adverse reactions.  During today's visit we discussed his recommendations from Dr. Leafy Ro regarding right heart catheterization.  We discussed the pathophysiology of valve disease and patient had all questions answered to his satisfaction.  Patient denies chest pain, palpitations, dyspnea, PND, orthopnea, nausea, vomiting, dizziness, syncope, edema, weight gain, or early satiety.   Home Medications    Current Outpatient Medications  Medication Sig Dispense Refill   amoxicillin (AMOXIL) 500 MG capsule Take 4 capsules by mouth 30-60 minutes prior to dental work 4 capsule 3   apixaban (ELIQUIS) 5 MG TABS tablet Take 1 tablet (5 mg total) by mouth 2 (two) times daily. 180 tablet 1    aspirin-acetaminophen-caffeine (EXCEDRIN MIGRAINE) 250-250-65 MG tablet Take 1 tablet by mouth every 6 (six) hours as needed for headache.     ferrous sulfate 325 (65 FE) MG tablet TAKE 1 TABLET BY MOUTH EVERY DAY WITH BREAKFAST 90 tablet 2   lisinopril (ZESTRIL) 10 MG tablet TAKE 1 TABLET (10 MG TOTAL) BY MOUTH DAILY. DOSE INCREASE. STOPPING LISINOPRIL 5 MG 30 tablet 4   pantoprazole (PROTONIX) 40 MG tablet Take 1 tablet (40 mg total) by mouth daily before breakfast. 30 tablet 1   rosuvastatin (CRESTOR) 20 MG tablet Take 1 tablet (20 mg total) by mouth daily. 90 tablet 3   diltiazem (CARDIZEM CD) 240 MG 24 hr capsule Take 1 capsule (240 mg total) by mouth daily. 90 capsule 3   No current facility-administered medications for this visit.     Review of Systems  Please see the history of present illness.    (+)Fatigue (+)SOB  All other systems reviewed and are otherwise negative except as noted above.  Physical Exam    Wt Readings from Last 3 Encounters:  12/31/22 139 lb 12.8  oz (63.4 kg)  06/08/22 143 lb (64.9 kg)  05/21/22 140 lb 9.6 oz (63.8 kg)   VS: Vitals:   12/31/22 0919  BP: 132/76  Pulse: 61  SpO2: 96%  ,Body mass index is 20.64 kg/m.  Constitutional:      Appearance: Healthy appearance. Not in distress.  Neck:     Vascular: JVD normal.  Pulmonary:     Effort: Pulmonary effort is normal.     Breath sounds: No wheezing. No rales. Diminished in the bases Cardiovascular:     Irregularly Irregular Normal S1. Normal S2.      Murmurs: Soft systolic murmur Edema:    Peripheral edema absent.  Abdominal:     Palpations: Abdomen is soft non tender. There is no hepatomegaly.  Skin:    General: Skin is warm and dry.  Neurological:     General: No focal deficit present.     Mental Status: Alert and oriented to person, place and time.     Cranial Nerves: Cranial nerves are intact.  EKG/LABS/ Recent Cardiac Studies    ECG personally reviewed by me today -atrial flutter  with variable AV block and left anterior fascicular block with possible LVH and rate of 84 bpm with no acute changes.  Cardiac Studies & Procedures   CARDIAC CATHETERIZATION  CARDIAC CATHETERIZATION 09/20/2015  Narrative  Mid LAD lesion, 20% stenosed. Minimal, nonobstructive CAD.  The left ventricular systolic function is normal. No interventricular shunt noted.  Normal right heart pressures. PA sat 66%. CO 4.1 L/min. CI 2.2.  Continue medical therapy.  Serial echocardiograms to follow mitral valve disease.  Risk factor modification including avoiding tobacco.  Findings Coronary Findings Diagnostic  Dominance: Right  Left Anterior Descending  Left Circumflex The vessel exhibits minimal luminal irregularities.  Intervention  No interventions have been documented.   STRESS TESTS  ECHOCARDIOGRAM STRESS TEST 09/27/2015  Narrative *[facility]* [address] [place], [place] [address] [phone]  ------------------------------------------------------------------- Stress Echocardiography  Patient:    Jazir, Lichtenwalner MR #:       [id number] Study Date: 08/19/2015 Gender:     M Age:        50 Height:     175.3 cm Weight:     64.1 kg BSA:        1.76 m^2 Pt. Status: Room:  SONOGRAPHER  Dewitt Hoes, RDCS PERFORMING   [facility], Outpatient ATTENDING    Azalee Course [id number] HQIONGEX     BMWU, Wynema Birch [id number] Janae Sauce [id number]  cc:  -------------------------------------------------------------------  ------------------------------------------------------------------- Indications:      Chest pain (R07.89).  Shortness of breath (R06.09).  Mitral Regurgitation (I34.0). Stress for mitral regurgitation evaluation.  ------------------------------------------------------------------- Impressions:  - Test was originally ordered to evaluate for wall motion abnormalities.Patient exercised into stage 4 but heart rate was only 112 and he was not close to his  target heart rate (PMHR 171). Therefore the study was changed. He exercise a second time and this time achieved a heart rate 139 at the end of stage 4, representing 81% of PMHR. This time his mitral regurgitation was evaluated with exercise. The patient has moderate mitral regurgitation at rest. This does not appear to change appreciably with treadmill stress. MR volume at heart rate of 75 bpm ranges between 23-30 ml.  Bruce protocol. Stress echocardiography.  Birthdate:  Patient birthdate: 1966-02-12.  Age:  Patient is 57 yr old.  Sex:  Gender: male.    BMI: 20.9 kg/m^2.  Patient  status:  Outpatient.  Study date:  Study date: 08/19/2015. Study time: 07:30 AM.  -------------------------------------------------------------------  ------------------------------------------------------------------- Stress protocol:  +---------------------+---+------------+----------------+--------+ Stage                HR BP (mmHg)   Rhythm          Symptoms +---------------------+---+------------+----------------+--------+ Baseline             52 125/82 (96) ----------------None     +---------------------+---+------------+----------------+--------+ Stage 1              82 139/80 (100)------------------------ +---------------------+---+------------+----------------+--------+ Stage 2              88 140/77 (98) ------------------------ +---------------------+---+------------+----------------+--------+ Stage 3              161096/04 (100)Occasional PVC&'s-------- +---------------------+---+------------+----------------+--------+ Stage 4              131119/79 (92) ------------------------ +---------------------+---+------------+----------------+--------+ Immediate post stress137------------------------------------ +---------------------+---+------------+----------------+--------+ Recovery; 1 min      93 132/76 (95)  ------------------------ +---------------------+---+------------+----------------+--------+ Recovery; 2 min      69 ------------------------------------ +---------------------+---+------------+----------------+--------+ Recovery; 3 min      68 137/74 (95) ------------------------ +---------------------+---+------------+----------------+--------+ Recovery; 5 min      67 131/71 (91) ------------------------ +---------------------+---+------------+----------------+--------+  ------------------------------------------------------------------- Stress results:   Maximal heart rate during stress was 137 bpm (81% of maximal predicted heart rate). The maximal predicted heart rate was 170 bpm.The target heart rate was not achieved. The heart rate response to stress was normal. There was a normal resting blood pressure with an appropriate response to stress. The rate-pressure product for the peak heart rate and blood pressure was 54098 mm Hg/min.  ------------------------------------------------------------------- Baseline:  Peak stress:  ------------------------------------------------------------------- Measurements  Mitral valve                    Value Mitral regurg VTI, PISA         236   cm Mitral ERO, PISA                0.15  cm^2 Mitral regurg volume, PISA      35    ml  Legend: (L)  and  (H)  mark values outside specified reference range.  ------------------------------------------------------------------- Prepared and Electronically Authenticated by  Cassell Clement, MD 2017-01-16T17:43:52   ECHOCARDIOGRAM  ECHOCARDIOGRAM COMPLETE 12/26/2021  Narrative ECHOCARDIOGRAM REPORT    Patient Name:   Terry Golden. Date of Exam: 12/26/2021 Medical Rec #:  [id number]           Height:       69.0 in Accession #:    [id number]          Weight:       139.8 lb Date of Birth:  1966-05-17            BSA:          1.774 m Patient Age:    56 years             BP:           134/74 mmHg Patient Gender: M                   HR:           56 bpm. Exam Location:  [address]  Procedure: 2D Echo, Cardiac Doppler, Color Doppler and 3D Echo  Indications:    Congential Heart Disease Q24.0  History:        Patient has prior history of Echocardiogram examinations, most recent 04/17/2019.  Non-ischemic Cardiomyopathy, Mitral Valve Disease; Arrythmias:Atrial Flutter. Septal Repair:s/p ASD & VSD Repair. 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  Mitral Valve: commisuroplasty valve is present in the mitral position. Procedure Date: 03/25/1999.  Sonographer:    Thurman Coyer RDCS Referring Phys: [id number] DAYNA N DUNN  IMPRESSIONS   1. 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 There is severe MV regurgitation that is central. The leaflets are thickened with restricted movement in systole/diastole possibly related to rheumatic heart disease. Only 1 prominent papillary muscle is seen so parachute-like mitral valve is also a possibility given congenital heart history. MG 3 mmHG @ 55 bpm. There is systolic flow reversal in the right inferior PV. Would recommend a TEE for calrification. The mitral valve is abnormal. Severe mitral valve regurgitation. Mild mitral stenosis. There is a commisuroplasty present in the mitral position. Procedure Date: 03/25/1999. 2. Left ventricular ejection fraction, by estimation, is 50 to 55%. The left ventricle has low normal function. The left ventricle has no regional wall motion abnormalities. There is mild concentric left ventricular hypertrophy. Left ventricular diastolic function could not be evaluated. 3. Right ventricular systolic function is moderately reduced. The right ventricular size is mildly enlarged. Tricuspid regurgitation signal is inadequate for  assessing PA pressure. 4. Left atrial size was severely dilated. 5. History of Primum ASD repair. 6. Right atrial size was mildly dilated. 7. The aortic valve is tricuspid. Aortic valve regurgitation is not visualized. No aortic stenosis is present. 8. The inferior vena cava is normal in size with greater than 50% respiratory variability, suggesting right atrial pressure of 3 mmHg.  Comparison(s): Changes from prior study are noted. MR is severe. EF ~50%.  FINDINGS Left Ventricle: Left ventricular ejection fraction, by estimation, is 50 to 55%. The left ventricle has low normal function. The left ventricle has no regional wall motion abnormalities. The left ventricular internal cavity size was normal in size. There is mild concentric left ventricular hypertrophy. Abnormal (paradoxical) septal motion consistent with post-operative status. Left ventricular diastolic function could not be evaluated.  Right Ventricle: The right ventricular size is mildly enlarged. No increase in right ventricular wall thickness. Right ventricular systolic function is moderately reduced. Tricuspid regurgitation signal is inadequate for assessing PA pressure.  Left Atrium: Left atrial size was severely dilated.  Right Atrium: Right atrial size was mildly dilated.  Pericardium: There is no evidence of pericardial effusion.  Mitral Valve: History of primum ASD with MV cleft status post repair. There is severe MV regurgitation that is central. The leaflets are thickened with restricted movement in systole/diastole possibly related to rheumatic heart disease. Only 1 prominent papillary muscle is seen so parachute-like mitral valve is also a possibility given congenital heart history. MG 3 mmHG @ 55 bpm. There is systolic flow reversal in the right inferior PV. Would recommend a TEE for calrification. The mitral valve is abnormal. Severe mitral valve regurgitation. There is a commisuroplasty present in the mitral  position. Procedure Date: 03/25/1999. Mild mitral valve stenosis. MV peak gradient, 10.5 mmHg. The mean mitral valve gradient is 3.0 mmHg with average heart rate of 55 bpm.  Tricuspid Valve: The tricuspid valve is grossly normal. Tricuspid valve regurgitation is mild . No evidence of tricuspid stenosis.  Aortic Valve: The aortic valve is tricuspid. Aortic valve regurgitation is not visualized. No aortic stenosis is present.  Pulmonic Valve: The pulmonic valve was grossly normal. Pulmonic valve regurgitation is trivial. No evidence of pulmonic stenosis.  Aorta: The aortic root and ascending aorta are structurally normal, with no evidence of dilitation.  Venous: The inferior vena cava is normal in size with greater than 50% respiratory variability, suggesting right atrial pressure of 3 mmHg.  IAS/Shunts: The atrial septum is grossly normal.   LEFT VENTRICLE PLAX 2D LVIDd:         5.30 cm LVIDs:         3.80 cm LV PW:         1.20 cm LV IVS:        1.20 cm LVOT diam:     2.00 cm      3D Volume EF: LV SV:         105          3D EF:        50 % LV SV Index:   59           LV EDV:       167 ml LVOT Area:     3.14 cm     LV ESV:       83 ml LV SV:        84 ml  LV Volumes (MOD) LV vol d, MOD A2C: 124.0 ml LV vol d, MOD A4C: 119.0 ml LV vol s, MOD A2C: 68.1 ml LV vol s, MOD A4C: 59.0 ml LV SV MOD A2C:     55.9 ml LV SV MOD A4C:     119.0 ml LV SV MOD BP:      58.4 ml  RIGHT VENTRICLE RV Basal diam:  4.20 cm RV Mid diam:    2.70 cm RV S prime:     10.90 cm/s TAPSE (M-mode): 1.5 cm  LEFT ATRIUM              Index        RIGHT ATRIUM           Index LA diam:        3.80 cm  2.14 cm/m   RA Area:     16.50 cm LA Vol (A2C):   143.0 ml 80.59 ml/m  RA Volume:   53.20 ml  29.98 ml/m LA Vol (A4C):   75.8 ml  42.72 ml/m LA Biplane Vol: 104.0 ml 58.61 ml/m AORTIC VALVE LVOT Vmax:   155.00 cm/s LVOT Vmean:  101.000 cm/s LVOT VTI:    0.335 m  AORTA Ao Root diam: 3.40 cm Ao Asc  diam:  3.40 cm  MITRAL VALVE MV Area (PHT): 2.22 cm       SHUNTS MV Area VTI:   1.97 cm       Systemic VTI:  0.34 m MV Peak grad:  10.5 mmHg      Systemic Diam: 2.00 cm MV Mean grad:  3.0 mmHg MV Vmax:       1.62 m/s MV Vmean:      78.0 cm/s MV Decel Time: 342 msec MR Peak grad:    190.4 mmHg MR Mean grad:    114.0 mmHg MR Vmax:         690.00 cm/s MR Vmean:        500.0 cm/s MR PISA:         2.65 cm MR PISA Eff ROA: 15 mm MR PISA Radius:  0.65 cm MV E velocity: 132.00 cm/s MV A velocity: 124.00 cm/s MV E/A ratio:  1.06  Lennie Odor MD Electronically signed by Lennie Odor MD Signature Date/Time: 12/26/2021/10:58:45 AM    Final   TEE  ECHO TEE 01/08/2022  Narrative TRANSESOPHOGEAL ECHO REPORT    Patient Name:   Terry Golden. Date of Exam: 01/08/2022 Medical Rec #:  [id number]           Height:       69.0 in Accession #:    [id number]          Weight:       140.0 lb Date of Birth:  1966-03-09            BSA:          1.775 m Patient Age:    56 years            BP:           153/82 mmHg Patient Gender: M                   HR:           63 bpm. Exam Location:  Inpatient  Procedure: 3D Echo, Transesophageal Echo, Cardiac Doppler and Color Doppler  Indications:     I34.0 Nonrheumatic mitral (valve) insufficiency  History:         Patient has prior history of Echocardiogram examinations, most recent 12/26/2021. Cardiomyopathy; Mitral Valve Disease. Cleft mitral valve commisuroplasty. ASD VSD.  Sonographer:     Sheralyn Boatman RDCS Referring Phys:  [id number] Tacey Ruiz DUNN Diagnosing Phys: Laurance Flatten MD  PROCEDURE: After discussion of the risks and benefits of a TEE, an informed consent was obtained from the patient. The transesophogeal probe was passed without difficulty through the esophogus of the patient. Imaged were obtained with the patient in a left lateral decubitus position. Sedation performed by different physician. The patient was monitored while under  deep sedation. Anesthestetic sedation was provided intravenously by Anesthesiology: 589mg  of Propofol. The patient's vital signs; including heart rate, blood pressure, and oxygen saturation; remained stable throughout the procedure. The patient developed no complications during the procedure.  IMPRESSIONS   1. History of primum ASD with MV cleft s/p repair. The mitral valve leaflets appear thickened and restricted. There is mild mitral stenosis with mean gradient at HR 60bpm. There is a single papillary muscle visualized to which all mitral valve chords appear to be attached. 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 There is moderate mitral regurgitation by PISA with EROA 0.24cm2, RVol 52mL. 3D VC 0.3cm. There is systolic blunting in the LUPV. During interrogation of the RUPV and RLPV the patient appeared to enter a junctional rhythm with a short atrial reversal wave without true systolic flow reversal visualized (although systolic flow reversal was clearly seen on surface echo). Given discrepant values and possible underestimation of MR on current study (may be related to lower blood pressures in the setting of sedation), recommend CMR for further evaluation. Could also consider RHC to assess PA pressures. 2. Left ventricular ejection fraction, by estimation, is 50 to 55%. The left ventricle has low normal function. 3. Patient is s/p VSD repair with no residual shunting visualized by color doppler. 4. Right ventricular systolic function is moderately reduced. The right ventricular size is mildly enlarged. 5. Left atrial size was severely dilated. No left atrial/left atrial appendage thrombus was detected. 6. Right atrial size was severely dilated. 7. The patient is s/p primum ASD repair. There appears to be two tricuspid leaflets present following repair. One of  the leaflets is elongated and overriding compared to the other. There is mild tricuspid regurgitation. There is no tricuspid  stenosis. The tricuspid valve is abnormal. 8. The aortic valve is tricuspid. Aortic valve regurgitation is not visualized. No aortic stenosis is present. 9. Patient is s/p primum ASD repair. No residual shunting detected by color flow doppler.  FINDINGS Left Ventricle: Left ventricular ejection fraction, by estimation, is 50 to 55%. The left ventricle has low normal function. The left ventricular internal cavity size was normal in size.  Right Ventricle: The right ventricular size is mildly enlarged. No increase in right ventricular wall thickness. Right ventricular systolic function is moderately reduced.  Left Atrium: Left atrial size was severely dilated. No left atrial/left atrial appendage thrombus was detected.  Right Atrium: Right atrial size was severely dilated.  Pericardium: There is no evidence of pericardial effusion.  Mitral Valve: History of primum ASD with MV cleft s/p repair. The mitral valve leaflets appear thickened and restricted. There is mild mitral stenosis with mean gradient at HR 60bpm. There is a single papillary muscle visualized to which all mitral valve chords appear to be attached. 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 There is moderate mitral regurgitation by PISA with EROA 0.24cm2, RVol 52mL. 3D VC 0.3cm. There is systolic blunting in the LUPV. During interrogation of the RUPV and RLPV the patient appeared to enter a junctional rhythm with a short atrial reversal wave without true systolic flow reversal visualized (although systolic flow reversal was clearly seen on surface echo). Given discrepant values and possible underestimation of MR on current study (may be related to lower blood pressures in the setting of sedation), recommend CMR for further evaluation. Could also consider RHC to assess PA pressures. The mitral valve has been repaired/replaced. MV peak gradient, 13.0 mmHg. The mean mitral valve gradient is 3.0 mmHg.  Tricuspid Valve: The  patient is s/p primum ASD repair. There appears to be two tricuspid leaflets present following repair. One of the leaflets is elongated and overriding compared to the other. There is mild tricuspid regurgitation. There is no tricuspid stenosis. The tricuspid valve is abnormal. Tricuspid valve regurgitation is mild.  Aortic Valve: The aortic valve is tricuspid. Aortic valve regurgitation is not visualized. No aortic stenosis is present.  Pulmonic Valve: The pulmonic valve was normal in structure. Pulmonic valve regurgitation is trivial.  Aorta: The aortic root is normal in size and structure.  IAS/Shunts: Patient is s/p primum ASD repair. No residual shunting detected by color flow doppler.   MITRAL VALVE                  TRICUSPID VALVE MV Peak grad: 13.0 mmHg       TR Peak grad:   23.8 mmHg MV Mean grad: 3.0 mmHg        TR Vmax:        244.00 cm/s MV Vmax:      1.80 m/s MV Vmean:     82.0 cm/s MR Peak grad:    168.0 mmHg MR Mean grad:    102.0 mmHg MR Vmax:         648.00 cm/s MR Vmean:        467.0 cm/s MR PISA:         3.08 cm MR PISA Eff ROA: 18 mm MR PISA Radius:  0.70 cm  Laurance Flatten MD Electronically signed by Laurance Flatten MD Signature Date/Time: 01/08/2022/8:14:39 PM    Final    CT SCANS  CT CORONARY MORPH W/CTA COR W/SCORE 09/11/2015  Addendum 09/11/2015  6:04 PM ADDENDUM REPORT: 09/11/2015 18:01 CLINICAL DATA:  Chest pain EXAM: Cardiac CTA MEDICATIONS: None TECHNIQUE: The patient was scanned on a Philips 256 slice scanner. Gantry rotation speed was 270 msecs. Collimation was .9mm. A 120 kV retrospective scan was triggered in the descending thoracic aorta at 111 HU's . The 3D data set was interpreted on a dedicated work station using MPR, MIP and VRT modes. A total of 80cc of contrast was used. FINDINGS: Non-cardiac: See separate report from [facility]. Coronary Arteries: Right dominant with no anomalies LM: Normal LAD: Heavily  calcified ostial proximal and mid vessel. Blooming artifact and motion from PAC;s degrades image. 50% or possibly greater stenosis ostial LAD proximal LAD. Less than 50% calcified stenosis In the mid LAD D1: Normal D2: Normal D3: Normal Circumflex:  Small not well seen RCA:  Dominant and normal PDA: Normal PLA:  Normal IMPRESSION: 1) Difficult study to interpret due to PAC;s and extensive calcium in LAD. Possible greater than 50% stenosis in ostial/proximal LAD. Circumflex not well seen RCA dominant and normal Given location of lesion, family history and chest pain would consider diagnostic heart catheterization 2) Moderate biatrial enlargement 3) No residual ASD/VSD?  Area of patch repair near membranous septum 4)?  Suture repair of clear anterior mitral leaflet 5) Moderate RV enlargement 6) Dilated left main pulmonary artery 31 mm Charlton Haws Electronically Signed By: Charlton Haws M.D. On: 09/11/2015 18:01  Narrative EXAM: OVER-READ INTERPRETATION  CT CHEST  The following report is an over-read performed by radiologist Dr. Royal Piedra [facility] on 09/11/2015. This over-read does not include interpretation of cardiac or coronary anatomy or pathology. The coronary calcium score/coronary CTA interpretation by the cardiologist is attached.  COMPARISON:  No priors.  FINDINGS: 4 mm subpleural nodule associated with the minor fissure in the superior aspect of the right middle lobe (image 67 of series 412), likely to represent a subpleural lymph node. Nodular appearing 2.2 x 2.1 cm area of cystic lucencies and septal thickening in the right lower lobe (image 89 of series 412). No acute consolidative airspace disease, pneumothorax or pleural effusion in the visualized portions of the thorax. Post infectious or inflammatory scarring in the right middle lobe. Moderate centrilobular and paraseptal emphysema. Although the pulmonic trunk is normal in caliber  (23 mm) the right and left main pulmonary arteries are markedly dilated (29 mm and 32 mm in diameter respectively). Visualized portions of the upper abdomen are unremarkable. Median sternotomy wires. There are no aggressive appearing lytic or blastic lesions noted in the visualized portions of the skeleton.  IMPRESSION: 1. Sub solid nodular area of cystic change and septal thickening in the right lower lobe measuring 2.1 x 2.3 cm. Initial follow-up by chest CT without contrast is recommended in 3 months to confirm persistence. This recommendation follows the consensus statement: Recommendations for the Management of Subsolid Pulmonary Nodules Detected at CT: A Statement from the Fleischner Society as published in Radiology 2013; 266:304-317. 2. Mild diffuse bronchial thickening with moderate centrilobular and paraseptal emphysema; imaging findings suggestive of underlying COPD. 3. Dilatation of the main pulmonary arteries bilaterally, which may suggest pulmonary arterial hypertension.  Electronically Signed: By: Trudie Reed M.D. 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  EXAM:  CARDIAC MRI  TECHNIQUE: The patient was scanned on a 1.5 Tesla Siemens magnet. A dedicated cardiac coil was used. Functional imaging was done using Fiesta sequences. 2,3, and 4 chamber views were done to assess for RWMA's. Modified Simpson's rule using a short axis stack was used to calculate an ejection fraction on a dedicated work [employer]. The patient received 7 cc of Gadavist. After 10 minutes inversion recovery sequences were used to assess for infiltration and scar tissue. Phase contrast velocity mapping was performed.  CONTRAST:  7 cc  of Gadavist  FINDINGS: Left ventricle:  -Mild  dilatation  -Mild systolic dysfunction  -Normal ECV (28%)  -Normal T2 values  -RV insertion site LGE  LV EF: 42% (Normal 56-78%)  Absolute volumes:  LV EDV: (Normal 77-195 mL)  LV ESV: (Normal 19-72 mL)  LV SV: 90mL (Normal 51-133 mL)  CO: 4.5L/min (Normal 2.8-8.8 L/min)  Indexed volumes:  LV EDV: 142mL/sq-m (Normal 47-92 mL/sq-m)  LV ESV: 29mL/sq-m (Normal 13-30 mL/sq-m)  LV SV: 54mL/sq-m (Normal 32-62 mL/sq-m)  CI: 2.6L/min/sq-m (Normal 1.7-4.2 L/min/sq-m)  Right ventricle: Mild dilatation with mild systolic dysfunction  RV EF:  16% (Normal 47-74%)  Absolute volumes:  RV EDV: (Normal 88-227 mL)  RV ESV: (Normal 23-103 mL)  RV SV: 85mL (Normal 52-138 mL)  CO: 4.3L/min (Normal 2.8-8.8 L/min)  Indexed volumes:  RV EDV: 177mL/sq-m (Normal 55-105 mL/sq-m)  RV ESV: 9mL/sq-m (Normal 15-43 mL/sq-m)  RV SV: 60mL/sq-m (Normal 32-64 mL/sq-m)  CI: 2.4L/min/sq-m (Normal 1.7-4.2 L/min/sq-m)  Left atrium: Mild enlargement. S/p atrioventricular septal defect repair. Qp/Qs 1.0  Right atrium: Normal size  Mitral valve: Moderate regurgitation (regurgitant fraction 20%)  Aortic valve: No regurgitation  Tricuspid valve: Mild regurgitation (regurgitant fraction 14%)  Pulmonic valve: No regurgitation  Aorta: Normal proximal ascending aorta  Pericardium: Normal  IMPRESSION: 1.  Mild LV dilatation with mild systolic dysfunction (EF 42%)  2.  Mild RV dilatation with mild systolic dysfunction (EF 41%)  3. Status post atrioventricular septal defect repair. Qp/Qs 1.0, indicating no residual shunt  4.  Moderate mitral regurgitation (regurgitant fraction 20%)  5.  Mild tricuspid regurgitation (regurgitant fraction 14%)   Electronically Signed By: Epifanio Lesches M.D. On: 03/24/2022 14:14         Risk Assessment/Calculations:    CHA2DS2-VASc Score = 2   This indicates a 2.2% annual risk of stroke. The patient's score is  based upon: CHF History: 1 HTN History: 1 Diabetes History: 0 Stroke History: 0 Vascular Disease History: 0 Age Score: 0 Gender Score: 0           Lab Results  Component Value Date   WBC 6.0 02/04/2022   HGB 12.2 (L) 02/04/2022   HCT 37.1 (L) 02/04/2022   MCV 98 (H) 02/04/2022   PLT 229 02/04/2022   Lab Results  Component Value Date   CREATININE 0.95 02/04/2022   BUN 14 02/04/2022   NA 141 02/04/2022   K 3.9 02/04/2022   CL 105 02/04/2022   CO2 23 02/04/2022   Lab Results  Component Value Date   ALT 9 04/16/2022   AST 19 04/16/2022   ALKPHOS 61 04/16/2022   BILITOT 0.2 04/16/2022   Lab Results  Component Value Date   CHOL 161 04/16/2022   HDL 52 04/16/2022   LDLCALC 97 04/16/2022   TRIG 59 04/16/2022   CHOLHDL 3.1 04/16/2022    Lab Results  Component Value Date   HGBA1C 5.1 12/04/2018  Assessment & Plan    Congenital heart disease: -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 -Patient was evaluated by structural heart team who recommended evaluation by Dr. Leafy Ro. -Today patient reports that symptoms have continued and is still experiencing shortness of breath and dyspnea on exertion. -We will have patient complete right heart catheterization to measure right-sided heart pressures. -BMET and CBC today -Patient will hold Eliquis 2 days prior to scheduled procedure and should resume postprocedure once hemostasis is achieved. -SBE prophylaxis discussed -Continue lisinopril 10 mg  2.  Mild nonobstructive CAD: -s/p LHC in 2017 with nonobstructive CAD noted. -Patient reports no chest pain since previous visit. -Continue Crestor 20 mg daily  3.  History of atrial flutter: -Today patient is asymptomatic and controlled a flutter. -Patient's last creatinine was 0.9 and hemoglobin was 12.2. -Continue Eliquis 5 mg twice daily -Due to patient being asymptomatic and ongoing symptoms we will have him complete 14-day ZIO monitor to  evaluate flutter burden -Continue Cardizem 240 mg daily  4.  Essential hypertension: -Patient's blood pressure today was controlled at 132/76 -Continue lisinopril 10 mg daily  5. Fatigue/SOB: -Patient reports ongoing fatigue and shortness of breath that is frequent and ongoing since previous visit. -Patient will have right heart cath as noted above and was advised to contact us if fatigue and shortness of breath continues.   Disposition: Follow-up with Lance Muss, MD or APP in 3 months Shared Decision Making/Informed Consent The risks [stroke (1 in 1000), death (1 in 1000), kidney failure [usually temporary] (1 in 500), bleeding (1 in 200), allergic reaction [possibly serious] (1 in 200)], benefits (diagnostic support and management of coronary artery disease) and alternatives of a cardiac catheterization were discussed in detail with Mr. Chilson and he is willing to proceed.   Medication Adjustments/Labs and Tests Ordered: Current medicines are reviewed at length with the patient today.  Concerns regarding medicines are outlined above.   Signed, Napoleon Form, Leodis Rains, NP 12/31/2022, 11:33 AM [facility]

## 2022-12-31 ENCOUNTER — Encounter: Payer: Self-pay | Admitting: Nurse Practitioner

## 2022-12-31 ENCOUNTER — Ambulatory Visit: Payer: BLUE CROSS/BLUE SHIELD | Attending: Nurse Practitioner

## 2022-12-31 ENCOUNTER — Ambulatory Visit: Payer: BLUE CROSS/BLUE SHIELD | Attending: Nurse Practitioner | Admitting: Nurse Practitioner

## 2022-12-31 VITALS — BP 132/76 | HR 61 | Ht 69.0 in | Wt 139.8 lb

## 2022-12-31 DIAGNOSIS — Q249 Congenital malformation of heart, unspecified: Secondary | ICD-10-CM

## 2022-12-31 DIAGNOSIS — I059 Rheumatic mitral valve disease, unspecified: Secondary | ICD-10-CM

## 2022-12-31 DIAGNOSIS — I10 Essential (primary) hypertension: Secondary | ICD-10-CM | POA: Diagnosis not present

## 2022-12-31 DIAGNOSIS — I484 Atypical atrial flutter: Secondary | ICD-10-CM

## 2022-12-31 DIAGNOSIS — Q211 Atrial septal defect, unspecified: Secondary | ICD-10-CM

## 2022-12-31 LAB — CBC

## 2022-12-31 LAB — BASIC METABOLIC PANEL
BUN/Creatinine Ratio: 11 (ref 9–20)
BUN: 12 mg/dL (ref 6–24)
Glucose: 120 mg/dL — ABNORMAL HIGH (ref 70–99)

## 2022-12-31 MED ORDER — DILTIAZEM HCL ER COATED BEADS 240 MG PO CP24: 240.0000 mg | ORAL_CAPSULE | Freq: Every day | ORAL | 3 refills | Status: DC

## 2022-12-31 NOTE — Progress Notes (Unsigned)
Enrolled for [employer] to mail a ZIO XT long term holter monitor to the patients address on file.   Requested delivery 01/05/23. Patient to apply after cath 01/06/23.  Dr. Eldridge Dace to read.

## 2022-12-31 NOTE — Patient Instructions (Addendum)
Medication Instructions:  Your physician recommends that you continue on your current medications as directed. Please refer to the Current Medication list given to you today. *If you need a refill on your cardiac medications before your next appointment, please call your pharmacy*   Lab Work: TODAY-BMET & CBC If you have labs (blood work) drawn today and your tests are completely normal, you will receive your results only by: MyChart Message (if you have MyChart) OR A paper copy in the mail If you have any lab test that is abnormal or we need to change your treatment, we will call you to review the results.   Testing/Procedures: Your physician has requested that you have a cardiac catheterization. Cardiac catheterization is used to diagnose and/or treat various heart conditions. Doctors may recommend this procedure for a number of different reasons. The most common reason is to evaluate chest pain. Chest pain can be a symptom of coronary artery disease (CAD), and cardiac catheterization can show whether plaque is narrowing or blocking your heart's arteries. This procedure is also used to evaluate the valves, as well as measure the blood flow and oxygen levels in different parts of your heart. For further information please visit [email or web address] Please follow instruction sheet, as given.  ZIO XT- Long Term Monitor Instructions  Your physician has requested you wear a ZIO patch monitor for 14 days.  This is a single patch monitor. [employer] supplies one patch monitor per enrollment. Additional stickers are not available. Please do not apply patch if you will be having a Nuclear Stress Test,  Echocardiogram, Cardiac CT, MRI, or Chest Xray during the period you would be wearing the  monitor. The patch cannot be worn during these tests. You cannot remove and re-apply the  ZIO XT patch monitor.  Your ZIO patch monitor will be mailed 3 day USPS to your address on file. It may take 3-5 days  to  receive your monitor after you have been enrolled.  Once you have received your monitor, please review the enclosed instructions. Your monitor  has already been registered assigning a specific monitor serial # to you.  Billing and Patient Assistance Program Information  We have supplied [employer] with any of your insurance information on file for billing purposes. [employer] offers a sliding scale Patient Assistance Program for patients that do not have  insurance, or whose insurance does not completely cover the cost of the ZIO monitor.  You must apply for the Patient Assistance Program to qualify for this discounted rate.  To apply, please call [employer] at [phone], select option 4, select option 2, ask to apply for  Patient Assistance Program. Meredeth Ide will ask your household income, and how many people  are in your household. They will quote your out-of-pocket cost based on that information.  [employer] will also be able to set up a 45-month, interest-free payment plan if needed.  Applying the monitor   Shave hair from upper left chest.  Hold abrader disc by orange tab. Rub abrader in 40 strokes over the upper left chest as  indicated in your monitor instructions.  Clean area with 4 enclosed alcohol pads. Let dry.  Apply patch as indicated in monitor instructions. Patch will be placed under collarbone on left  side of chest with arrow pointing upward.  Rub patch adhesive wings for 2 minutes. Remove white label marked "1". Remove the white  label marked "2". Rub patch adhesive wings for 2 additional minutes.  While looking in a mirror, press and  release button in center of patch. A small green light will  flash 3-4 times. This will be your only indicator that the monitor has been turned on.  Do not shower for the first 24 hours. You may shower after the first 24 hours.  Press the button if you feel a symptom. You will hear a small click. Record Date, Time and  Symptom in the Patient  Logbook.  When you are ready to remove the patch, follow instructions on the last 2 pages of Patient  Logbook. Stick patch monitor onto the last page of Patient Logbook.  Place Patient Logbook in the blue and white box. Use locking tab on box and tape box closed  securely. The blue and white box has prepaid postage on it. Please place it in the mailbox as  soon as possible. Your physician should have your test results approximately 7 days after the  monitor has been mailed back to [facility].  Call [facility] Customer Care at [phone] if you have questions regarding  your ZIO XT patch monitor. Call them immediately if you see an orange light blinking on your  monitor.  If your monitor falls off in less than 4 days, contact our Monitor department at [phone].  If your monitor becomes loose or falls off after 4 days call [employer] at [phone] for  suggestions on securing your monitor   Follow-Up: At [facility], you and your health needs are our priority.  As part of our continuing mission to provide you with exceptional heart care, we have created designated Provider Care Teams.  These Care Teams include your primary Cardiologist (physician) and Advanced Practice Providers (APPs -  Physician Assistants and Nurse Practitioners) who all work together to provide you with the care you need, when you need it.  We recommend signing up for the patient portal called "MyChart".  Sign up information is provided on this After Visit Summary.  MyChart is used to connect with patients for Virtual Visits (Telemedicine).  Patients are able to view lab/test results, encounter notes, upcoming appointments, etc.  Non-urgent messages can be sent to your provider as well.   To learn more about what you can do with MyChart, go to [email or web address].    Your next appointment:   3 month(s)  Provider:   Lance Muss, MD  or Robin Searing, NP   Other Instructions             Cardiac Catheterization   You are scheduled for a Cardiac Catheterization on Wednesday, June 5 with Dr. Lance Muss.  1. Please arrive at the [facility] (Main Entrance A) at [facility]: [address] [place], [place] [address] at 8:00 AM (This time is 2 hour(s) before your procedure to ensure your preparation). Free valet parking service is available. You will check in at ADMITTING. The support person will be asked to wait in the waiting room.  It is OK to have someone drop you off and come back when you are ready to be discharged.        Special note: Every effort is made to have your procedure done on time. Please understand that emergencies sometimes delay scheduled procedures.  2. Diet: Do not eat solid foods after midnight.  You may have clear liquids until 5 AM the day of the procedure.  3. Labs: You will need to have blood drawn on Thursday, May 30 at [facility]. [address], [place]  Open: 7:30am - 5pm    Phone: [phone]. You do not need to be fasting.  4. Medication instructions in preparation for your procedure:   Contrast Allergy: No   Stop taking Eliquis (Apixiban) on Sunday, June 2. ( Last dose Sunday evening)   On the morning of your procedure, take Aspirin 81 mg and any morning medicines NOT listed above.  You may use sips of water.  5. Plan to go home the same day, you will only stay overnight if medically necessary. 6. You MUST have a responsible adult to drive you home. 7. An adult MUST be with you the first 24 hours after you arrive home. 8. Bring a current list of your medications, and the last time and date medication taken. 9. Bring ID and current insurance cards. 10.Please wear clothes that are easy to get on and off and wear slip-on shoes.  Thank you for allowing [place] to care for you!   -- [facility]

## 2023-01-01 LAB — BASIC METABOLIC PANEL
CO2: 23 mmol/L (ref 20–29)
Calcium: 9 mg/dL (ref 8.7–10.2)
Chloride: 107 mmol/L — ABNORMAL HIGH (ref 96–106)
Creatinine, Ser: 1.07 mg/dL (ref 0.76–1.27)
Potassium: 3.8 mmol/L (ref 3.5–5.2)
Sodium: 143 mmol/L (ref 134–144)
eGFR: 81 mL/min/{1.73_m2} (ref >59)

## 2023-01-01 LAB — CBC
MCHC: 34 g/dL (ref 31.5–35.7)
RDW: 12 % (ref 11.6–15.4)

## 2023-01-01 MED ORDER — SODIUM CHLORIDE 0.9% FLUSH
3.0000 mL | Freq: Two times a day (BID) | INTRAVENOUS | Status: DC
Start: 2023-01-01 — End: 2023-07-12

## 2023-01-01 NOTE — Addendum Note (Signed)
Addended by: Elizabeth Palau on: 01/01/2023 07:38 AM   Modules accepted: Orders

## 2023-01-05 ENCOUNTER — Telehealth: Payer: Self-pay | Admitting: *Deleted

## 2023-01-05 NOTE — Telephone Encounter (Signed)
Right Heart Cath  scheduled at [facility] for: Wednesday January 06, 2023 10 AM Arrival time [facility] Main Entrance A at: 8 AM  Nothing to eat after midnight prior to procedure, clear liquids until 5 AM day of procedure.  Medication instructions: -Hold:  Eliquis-none 01/04/23 until post procedure -Other usual morning medications can be taken with sips of water.  Confirmed patient has responsible adult to drive home post procedure and be with patient first 24 hours after arriving home.  Plan to go home the same day, you will only stay overnight if medically necessary.  Reviewed procedure instructions with patient.

## 2023-01-06 ENCOUNTER — Ambulatory Visit (HOSPITAL_COMMUNITY)
Admission: RE | Disposition: A | Discharge status: Home / Self Care | Payer: Self-pay | Source: Ambulatory Visit | Attending: Interventional Cardiology

## 2023-01-06 ENCOUNTER — Ambulatory Visit (HOSPITAL_COMMUNITY)
Admission: RE | Admit: 2023-01-06 | Discharge: 2023-01-06 | Disposition: A | Discharge status: Home / Self Care | Payer: BLUE CROSS/BLUE SHIELD | Source: Ambulatory Visit | Attending: Interventional Cardiology | Admitting: Interventional Cardiology

## 2023-01-06 ENCOUNTER — Other Ambulatory Visit: Payer: Self-pay

## 2023-01-06 ENCOUNTER — Other Ambulatory Visit: Payer: Self-pay | Admitting: Student

## 2023-01-06 DIAGNOSIS — I4892 Unspecified atrial flutter: Secondary | ICD-10-CM | POA: Insufficient documentation

## 2023-01-06 DIAGNOSIS — I10 Essential (primary) hypertension: Secondary | ICD-10-CM | POA: Diagnosis not present

## 2023-01-06 DIAGNOSIS — Z7901 Long term (current) use of anticoagulants: Secondary | ICD-10-CM | POA: Diagnosis not present

## 2023-01-06 DIAGNOSIS — I059 Rheumatic mitral valve disease, unspecified: Secondary | ICD-10-CM

## 2023-01-06 DIAGNOSIS — I2510 Atherosclerotic heart disease of native coronary artery without angina pectoris: Secondary | ICD-10-CM | POA: Insufficient documentation

## 2023-01-06 DIAGNOSIS — I340 Nonrheumatic mitral (valve) insufficiency: Secondary | ICD-10-CM | POA: Diagnosis present

## 2023-01-06 DIAGNOSIS — Q249 Congenital malformation of heart, unspecified: Secondary | ICD-10-CM

## 2023-01-06 DIAGNOSIS — Q212 Atrioventricular septal defect, unspecified as to partial or complete: Secondary | ICD-10-CM | POA: Insufficient documentation

## 2023-01-06 DIAGNOSIS — Q210 Ventricular septal defect: Secondary | ICD-10-CM | POA: Insufficient documentation

## 2023-01-06 DIAGNOSIS — Z79899 Other long term (current) drug therapy: Secondary | ICD-10-CM | POA: Insufficient documentation

## 2023-01-06 DIAGNOSIS — I428 Other cardiomyopathies: Secondary | ICD-10-CM | POA: Insufficient documentation

## 2023-01-06 HISTORY — PX: RIGHT/LEFT HEART CATH AND CORONARY ANGIOGRAPHY: CATH118266

## 2023-01-06 LAB — POCT I-STAT EG7
Acid-base deficit: 1 mmol/L (ref 0.0–2.0)
Bicarbonate: 23.3 mmol/L (ref 20.0–28.0)
Calcium, Ion: 1.15 mmol/L (ref 1.15–1.40)
HCT: 39 % (ref 39.0–52.0)
Hemoglobin: 13.3 g/dL (ref 13.0–17.0)
O2 Saturation: 91 %
Potassium: 3.9 mmol/L (ref 3.5–5.1)
Sodium: 141 mmol/L (ref 135–145)
TCO2: 24 mmol/L (ref 22–32)
pCO2, Ven: 38.8 mmHg — ABNORMAL LOW (ref 44–60)
pH, Ven: 7.387 (ref 7.25–7.43)
pO2, Ven: 63 mmHg — ABNORMAL HIGH (ref 32–45)

## 2023-01-06 LAB — POCT I-STAT 7, (LYTES, BLD GAS, ICA,H+H)
Acid-base deficit: 1 mmol/L (ref 0.0–2.0)
Acid-base deficit: 1 mmol/L (ref 0.0–2.0)
Bicarbonate: 25.5 mmol/L (ref 20.0–28.0)
Bicarbonate: 25.5 mmol/L (ref 20.0–28.0)
Calcium, Ion: 1.22 mmol/L (ref 1.15–1.40)
Calcium, Ion: 1.24 mmol/L (ref 1.15–1.40)
HCT: 39 % (ref 39.0–52.0)
HCT: 40 % (ref 39.0–52.0)
Hemoglobin: 13.3 g/dL (ref 13.0–17.0)
Hemoglobin: 13.6 g/dL (ref 13.0–17.0)
O2 Saturation: 66 %
O2 Saturation: 65 %
Potassium: 3.8 mmol/L (ref 3.5–5.1)
Potassium: 3.9 mmol/L (ref 3.5–5.1)
Sodium: 141 mmol/L (ref 135–145)
Sodium: 141 mmol/L (ref 135–145)
TCO2: 27 mmol/L (ref 22–32)
TCO2: 27 mmol/L (ref 22–32)
pCO2 arterial: 46.3 mmHg (ref 32–48)
pCO2 arterial: 46.6 mmHg (ref 32–48)
pH, Arterial: 7.349 — ABNORMAL LOW (ref 7.35–7.45)
pH, Arterial: 7.346 — ABNORMAL LOW (ref 7.35–7.45)
pO2, Arterial: 36 mmHg — CL (ref 83–108)
pO2, Arterial: 36 mmHg — CL (ref 83–108)

## 2023-01-06 SURGERY — RIGHT/LEFT HEART CATH AND CORONARY ANGIOGRAPHY
Anesthesia: LOCAL

## 2023-01-06 MED ORDER — VERAPAMIL HCL 2.5 MG/ML IV SOLN
INTRAVENOUS | Status: AC
  Filled 2023-01-06: qty 2

## 2023-01-06 MED ORDER — IOHEXOL 350 MG/ML SOLN
INTRAVENOUS | Status: DC | PRN
  Administered 2023-01-06: 45 mL

## 2023-01-06 MED ORDER — ASPIRIN 81 MG PO CHEW: 81.0000 mg | CHEWABLE_TABLET | ORAL | Status: DC

## 2023-01-06 MED ORDER — SODIUM CHLORIDE 0.9 % WEIGHT BASED INFUSION
3.0000 mL/kg/h | INTRAVENOUS | Status: AC
  Administered 2023-01-06: 3 mL/kg/h via INTRAVENOUS

## 2023-01-06 MED ORDER — FENTANYL CITRATE (PF) 100 MCG/2ML IJ SOLN
INTRAMUSCULAR | Status: AC
  Filled 2023-01-06: qty 2

## 2023-01-06 MED ORDER — SODIUM CHLORIDE 0.9 % IV SOLN: 250.0000 mL | INTRAVENOUS | Status: DC | PRN

## 2023-01-06 MED ORDER — LABETALOL HCL 5 MG/ML IV SOLN: 10.0000 mg | INTRAVENOUS | Status: DC | PRN

## 2023-01-06 MED ORDER — SODIUM CHLORIDE 0.9 % IV SOLN: INTRAVENOUS | Status: AC

## 2023-01-06 MED ORDER — SODIUM CHLORIDE 0.9% FLUSH: 3.0000 mL | INTRAVENOUS | Status: DC | PRN

## 2023-01-06 MED ORDER — SODIUM CHLORIDE 0.9% FLUSH: 3.0000 mL | Freq: Two times a day (BID) | INTRAVENOUS | Status: DC

## 2023-01-06 MED ORDER — HEPARIN SODIUM (PORCINE) 1000 UNIT/ML IJ SOLN
INTRAMUSCULAR | Status: DC | PRN
  Administered 2023-01-06: 3000 [IU] via INTRAVENOUS

## 2023-01-06 MED ORDER — HYDRALAZINE HCL 20 MG/ML IJ SOLN: 10.0000 mg | INTRAMUSCULAR | Status: DC | PRN

## 2023-01-06 MED ORDER — SODIUM CHLORIDE 0.9 % WEIGHT BASED INFUSION: 1.0000 mL/kg/h | INTRAVENOUS | Status: DC

## 2023-01-06 MED ORDER — LIDOCAINE HCL (PF) 1 % IJ SOLN
INTRAMUSCULAR | Status: AC
  Filled 2023-01-06: qty 30

## 2023-01-06 MED ORDER — FENTANYL CITRATE (PF) 100 MCG/2ML IJ SOLN
INTRAMUSCULAR | Status: DC | PRN
  Administered 2023-01-06: 25 ug via INTRAVENOUS

## 2023-01-06 MED ORDER — ONDANSETRON HCL 4 MG/2ML IJ SOLN: 4.0000 mg | Freq: Four times a day (QID) | INTRAMUSCULAR | Status: DC | PRN

## 2023-01-06 MED ORDER — ACETAMINOPHEN 325 MG PO TABS: 650.0000 mg | ORAL_TABLET | ORAL | Status: DC | PRN

## 2023-01-06 MED ORDER — LIDOCAINE HCL (PF) 1 % IJ SOLN
INTRAMUSCULAR | Status: DC | PRN
  Administered 2023-01-06: 5 mL
  Administered 2023-01-06: 10 mL

## 2023-01-06 MED ORDER — APIXABAN 5 MG PO TABS: 5.0000 mg | ORAL_TABLET | Freq: Two times a day (BID) | ORAL | 1 refills | Status: DC

## 2023-01-06 MED ORDER — MIDAZOLAM HCL 2 MG/2ML IJ SOLN
INTRAMUSCULAR | Status: DC | PRN
  Administered 2023-01-06: 2 mg via INTRAVENOUS

## 2023-01-06 MED ORDER — VERAPAMIL HCL 2.5 MG/ML IV SOLN
INTRAVENOUS | Status: DC | PRN
  Administered 2023-01-06 (×2): 10 mL via INTRA_ARTERIAL

## 2023-01-06 MED ORDER — MIDAZOLAM HCL 2 MG/2ML IJ SOLN
INTRAMUSCULAR | Status: AC
  Filled 2023-01-06: qty 2

## 2023-01-06 MED ORDER — HEPARIN (PORCINE) IN NACL 1000-0.9 UT/500ML-% IV SOLN
INTRAVENOUS | Status: DC | PRN
  Administered 2023-01-06 (×2): 500 mL

## 2023-01-06 SURGICAL SUPPLY — 13 items
BAND CMPR LRG ZPHR (HEMOSTASIS) ×1
BAND ZEPHYR COMPRESS 30 LONG (HEMOSTASIS) IMPLANT
CATH 5FR JL3.5 JR4 ANG PIG MP (CATHETERS) IMPLANT
CATH BALLN WEDGE 5F 110CM (CATHETERS) IMPLANT
GLIDESHEATH SLEND SS 6F .021 (SHEATH) IMPLANT
GUIDEWIRE INQWIRE 1.5J.035X260 (WIRE) IMPLANT
INQWIRE 1.5J .035X260CM (WIRE) ×1
KIT HEART LEFT (KITS) ×1 IMPLANT
PACK CARDIAC CATHETERIZATION (CUSTOM PROCEDURE TRAY) ×1 IMPLANT
SHEATH GLIDE SLENDER 4/5FR (SHEATH) IMPLANT
SHEATH PROBE COVER 6X72 (BAG) IMPLANT
TRANSDUCER W/STOPCOCK (MISCELLANEOUS) ×1 IMPLANT
TUBING CIL FLEX 10 FLL-RA (TUBING) ×1 IMPLANT

## 2023-01-06 NOTE — Discharge Instructions (Signed)

## 2023-01-06 NOTE — Progress Notes (Signed)
Ordered outpatient Echo for further evaluation of mitral regurgitation at the request of Dr. Eldridge Dace. Please see his cath report from today for more information.  Corrin Parker, PA-C 01/06/2023 10:05 AM

## 2023-01-06 NOTE — Interval H&P Note (Signed)
Cath Lab Visit (complete for each Cath Lab visit)  Clinical Evaluation Leading to the Procedure:   ACS: No.  Non-ACS:    Anginal Classification: CCS II  Anti-ischemic medical therapy: Minimal Therapy (1 class of medications)  Non-Invasive Test Results: High-risk stress test findings: cardiac mortality >3%/year  Prior CABG: No previous CABG      History and Physical Interval Note:  01/06/2023 8:45 AM  Terry Golden.  has presented today for surgery, with the diagnosis of mitral valve prolapse.  The various methods of treatment have been discussed with the patient and family. After consideration of risks, benefits and other options for treatment, the patient has consented to  Procedure(s): RIGHT/LEFT HEART CATH AND CORONARY ANGIOGRAPHY (N/A) as a surgical intervention.  The patient's history has been reviewed, patient examined, no change in status, stable for surgery.  I have reviewed the patient's chart and labs.  Questions were answered to the patient's satisfaction.     Lance Muss

## 2023-01-07 ENCOUNTER — Encounter (HOSPITAL_COMMUNITY): Payer: Self-pay | Admitting: Interventional Cardiology

## 2023-01-07 MED FILL — Verapamil HCl IV Soln 2.5 MG/ML: INTRAVENOUS | Qty: 2 | Status: AC

## 2023-01-07 MED FILL — Lidocaine HCl Local Preservative Free (PF) Inj 1%: INTRAMUSCULAR | Qty: 30 | Status: AC

## 2023-01-12 ENCOUNTER — Ambulatory Visit (HOSPITAL_COMMUNITY): Payer: BLUE CROSS/BLUE SHIELD | Attending: Cardiology

## 2023-01-12 DIAGNOSIS — I340 Nonrheumatic mitral (valve) insufficiency: Secondary | ICD-10-CM | POA: Diagnosis not present

## 2023-01-12 DIAGNOSIS — I059 Rheumatic mitral valve disease, unspecified: Secondary | ICD-10-CM | POA: Diagnosis not present

## 2023-01-12 LAB — ECHOCARDIOGRAM COMPLETE
Area-P 1/2: 4.74 cm2
MV M vel: 6.4 m/s
MV Peak grad: 163.7 mmHg
Radius: 0.9 cm
S' Lateral: 3.1 cm

## 2023-01-21 ENCOUNTER — Telehealth (HOSPITAL_COMMUNITY): Payer: Self-pay | Admitting: Licensed Clinical Social Worker

## 2023-01-21 NOTE — Telephone Encounter (Signed)
H&V Care Navigation CSW Progress Note  Clinical Social Worker consulted to reach out to pt regarding new insurance and how to find in Wellsite geologist.  CSW called but unable to reach- left VM requesting return call  Pt also in need of CAFA to help with bills from when he was uninsured- CSW coworker mailing to pt.   SDOH Screenings   Depression (PHQ2-9): Low Risk  (03/06/2020)  Tobacco Use: High Risk (01/07/2023)    Burna Sis, LCSW Clinical Social Worker [facility] Desk#: [phone] Cell#: [phone]

## 2023-01-22 ENCOUNTER — Telehealth: Payer: Self-pay | Admitting: Licensed Clinical Social Worker

## 2023-01-22 NOTE — Telephone Encounter (Signed)
H&V Care Navigation CSW Progress Note  Clinical Social Worker  consulted to reach out to pt regarding new insurance and how to find in Wellsite geologist.  My collegue Eileen Stanford called pt on 6/20 but unable to reach- left VM requesting return call. Today I also was unable to reach pt, left additional voicemail at [phone].   Pt w/ multiple previous bills, CAFA mailed by this writer on 6/20 to help with bills from when he was uninsured.  Our team will try again to reach pt as able. Patient is participating in a Managed Medicaid Plan:  No, BCBS commercial OON plan  SDOH Screenings   Depression (PHQ2-9): Low Risk  (03/06/2020)  Tobacco Use: High Risk (01/07/2023)   Terry Golden, MSW, LCSW Clinical Social Worker II [facility] Heart/Vascular Care Navigation  [phone]- work cell phone (preferred) [phone]- desk phone

## 2023-01-25 ENCOUNTER — Telehealth (HOSPITAL_COMMUNITY): Payer: Self-pay | Admitting: Licensed Clinical Social Worker

## 2023-01-25 NOTE — Telephone Encounter (Signed)
H&V Care Navigation CSW Progress Note  Clinical Social Worker called pt to assist with finding in network providers now that his insurance has changed- unable to reach -left VM requesting return call   SDOH Screenings   Depression (PHQ2-9): Low Risk  (03/06/2020)  Tobacco Use: High Risk (01/07/2023)     Burna Sis, LCSW Clinical Social Worker [facility] Desk#: [phone] Cell#: [phone]

## 2023-01-28 ENCOUNTER — Telehealth: Payer: Self-pay | Admitting: Licensed Clinical Social Worker

## 2023-01-28 NOTE — Telephone Encounter (Signed)
H&V Care Navigation CSW Progress Note  Clinical Social Worker consulted to reach out to pt regarding new insurance and how to find in Wellsite geologist or navigate resources to enroll in [employer].  My collegue Eileen Stanford called pt on 6/20, 6/24 but unable to reach- left 2 VMs requesting return call. Today I also was unable to reach pt, left additional voicemail at [phone]. I previously attempted to call pt on 6/21 with no answer and left voicemail at that time.    Pt w/ multiple previous bills, CAFA mailed by this writer on 6/20 to help with previous bills from when he was uninsured.   Our team will continue to try again to reach pt as able. Patient is participating in a Managed Medicaid Plan:  No, BCBS commercial OON plan  SDOH Screenings   Depression (PHQ2-9): Low Risk  (03/06/2020)  Tobacco Use: High Risk (01/07/2023)    Terry Golden, MSW, LCSW Clinical Social Worker II [facility] Heart/Vascular Care Navigation  [phone]- work cell phone (preferred) [phone]- desk phone

## 2023-02-01 ENCOUNTER — Telehealth: Payer: Self-pay | Admitting: Licensed Clinical Social Worker

## 2023-02-01 ENCOUNTER — Encounter: Payer: Self-pay | Admitting: *Deleted

## 2023-02-01 NOTE — Progress Notes (Signed)
ZIO XT monitor removed early.  Only 5 hours of analysis time out of 14 days.  [employer] contacted to cancel charges and order cancelled.  Patient is not covered by [facility] as listed in chart.

## 2023-02-01 NOTE — Telephone Encounter (Signed)
H&V Care Navigation CSW Progress Note  Clinical Social Worker consulted to reach out to pt regarding new insurance and how to find in Wellsite geologist or navigate resources to enroll in [employer].    Our team Eileen Stanford, LCSW, and this Clinical research associate) have called and left voicemails for pt on the following dates: 6/20/2024Eileen Stanford, LCSW, called and left voicemail 01/21/2023- this writer mailed pt a [employer] Application 01/22/2023- this Clinical research associate called and left voicemail 6/24/2024Eileen Stanford, LCSW, called and left voicemail 01/28/2023- this writer called and left voicemail 02/01/2023- this Clinical research associate called and left voicemail   Our team will continue to try again to reach pt as able. Patient is participating in a Managed Medicaid Plan:  No, BCBS commercial OON plan  SDOH Screenings   Depression (PHQ2-9): Low Risk  (03/06/2020)  Tobacco Use: High Risk (01/07/2023)   Octavio Graves, MSW, LCSW Clinical Social Worker II [facility] Heart/Vascular Care Navigation  [phone]- work cell phone (preferred) [phone]- desk phone

## 2023-02-03 ENCOUNTER — Telehealth: Payer: Self-pay | Admitting: Licensed Clinical Social Worker

## 2023-02-03 NOTE — Telephone Encounter (Signed)
H&V Care Navigation CSW Progress Note  Clinical Social Worker  consulted to reach out to pt regarding new insurance and how to find in Wellsite geologist or navigate resources to enroll in [employer].     Our team Eileen Stanford, LCSW, and this Clinical research associate) have called and left voicemails for pt on the following dates: 6/20/2024Eileen Stanford, LCSW, called and left voicemail 01/21/2023- this writer mailed pt a [employer] Application 01/22/2023- this Clinical research associate called and left voicemail 6/24/2024Eileen Stanford, LCSW, called and left voicemail 01/28/2023- this writer called and left voicemail 02/01/2023- this Clinical research associate called and left voicemail 02/03/2023- this Clinical research associate called and left voicemail   Our team will continue to try again to reach pt as able. Patient is participating in a Managed Medicaid Plan:  No, BCBS commercial OON plan    SDOH Screenings   Depression (PHQ2-9): Low Risk  (03/06/2020)  Tobacco Use: High Risk (01/07/2023)

## 2023-02-10 ENCOUNTER — Telehealth: Payer: Self-pay | Admitting: Licensed Clinical Social Worker

## 2023-02-10 NOTE — Telephone Encounter (Signed)
H&V Care Navigation CSW Progress Note  Clinical Social Worker  consulted to reach out to pt regarding new insurance and how to find in Wellsite geologist or navigate resources to enroll in [employer].     Our team Eileen Stanford, LCSW, and this Clinical research associate) have called and left voicemails for pt on the following dates: 6/20/2024Eileen Stanford, LCSW, called and left voicemail 01/21/2023- this writer mailed pt a [employer] Application 01/22/2023- this Clinical research associate called and left voicemail 6/24/2024Eileen Stanford, LCSW, called and left voicemail 01/28/2023- this writer called and left voicemail 02/01/2023- this Clinical research associate called and left voicemail 02/03/2023- this Clinical research associate called and left voicemail 02/09/22- this Clinical research associate called and left another voicemail.    I have updated [facility] and LCSW leadership of inability to reach pt. Patient is participating in a Managed Medicaid Plan:  No, BCBS commercial OON plan    SDOH Screenings   Depression (PHQ2-9): Low Risk  (03/06/2020)  Tobacco Use: High Risk (01/07/2023)   Octavio Graves, MSW, LCSW Clinical Social Worker II [facility] Health Heart/Vascular Care Navigation  [phone]- work cell phone (preferred) [phone]- desk phone

## 2023-02-11 ENCOUNTER — Other Ambulatory Visit: Payer: Self-pay | Admitting: Physician Assistant

## 2023-02-11 MED ORDER — ROSUVASTATIN CALCIUM 20 MG PO TABS: 20.0000 mg | ORAL_TABLET | Freq: Every day | ORAL | 3 refills | Status: DC

## 2023-02-19 ENCOUNTER — Other Ambulatory Visit: Payer: Self-pay | Admitting: Physician Assistant

## 2023-03-09 ENCOUNTER — Other Ambulatory Visit: Payer: Self-pay | Admitting: Interventional Cardiology

## 2023-03-09 ENCOUNTER — Other Ambulatory Visit: Payer: Self-pay | Admitting: Physician Assistant

## 2023-04-01 NOTE — Progress Notes (Deleted)
Office Visit    Patient Name: Terry Golden. 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   right   Mild CAD    Mitral valve disease 03/25/1999   s/p mitral valve repair with commissuroplasty    NICM (nonischemic cardiomyopathy) (HCC)    EF 40% in 2020, recovered   Rheumatic fever    Syncope    1999   Typical atrial flutter (HCC)    VSD (ventricular septal defect)    small   Past Surgical History:  Procedure Laterality Date   ASD AND VSD REPAIR     BIOPSY  12/16/2020   Procedure: BIOPSY;  Surgeon: Iva Boop, MD;  Location: [facility];  Service: Gastroenterology;;   CARDIAC CATHETERIZATION N/A 09/20/2015   Procedure: Right/Left Heart Cath and Coronary Angiography;  Surgeon: Corky Crafts, MD;  Location: [facility] INVASIVE CV LAB;  Service: Cardiovascular;  Laterality: N/A;   CARDIAC SURGERY     s/p partial AVSD repair and cleft mitral valve repair with commissuroplasty   CARDIOVERSION N/A 12/07/2018   Procedure: CARDIOVERSION;  Surgeon: Lars Masson, MD;  Location: [facility] ENDOSCOPY;  Service: Cardiovascular;  Laterality: N/A;   COLONOSCOPY  07/08/2020   ESOPHAGOGASTRODUODENOSCOPY (EGD) WITH PROPOFOL N/A 12/16/2020   Procedure: ESOPHAGOGASTRODUODENOSCOPY (EGD) WITH PROPOFOL;  Surgeon: Iva Boop, MD;  Location: [facility] ENDOSCOPY;  Service: Gastroenterology;  Laterality: N/A;   LIPOMA EXCISION Right 06/19/2020   Procedure: EXCISION SUBCUTANEOUS LIPOMA RIGHT SHOULDER;  Surgeon: Manus Rudd, MD;  Location: [facility] OR;  Service: General;  Laterality: Right;   MITRAL VALVE REPAIR     RIGHT/LEFT HEART CATH AND CORONARY  ANGIOGRAPHY N/A 01/06/2023   Procedure: RIGHT/LEFT HEART CATH AND CORONARY ANGIOGRAPHY;  Surgeon: Corky Crafts, MD;  Location: [facility] INVASIVE CV LAB;  Service: Cardiovascular;  Laterality: N/A;   TEE WITHOUT CARDIOVERSION N/A 12/07/2018   Procedure: TRANSESOPHAGEAL ECHOCARDIOGRAM (TEE);  Surgeon: Lars Masson, MD;  Location: [facility] ENDOSCOPY;  Service: Cardiovascular;  Laterality: N/A;   TEE WITHOUT CARDIOVERSION N/A 01/08/2022   Procedure: TRANSESOPHAGEAL ECHOCARDIOGRAM (TEE);  Surgeon: Meriam Sprague, MD;  Location: [facility] ENDOSCOPY;  Service: Cardiovascular;  Laterality: N/A;    Allergies  Allergies  Allergen Reactions   Tensilon [Edrophonium] Other (See Comments)    Blacked out      History of Present Illness    Terry Golden.  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  Terry Golden was seen initially by Dr. Eldridge Dace in 2016 when he presented to the ED for complaint of worsening shortness of breath with lightheadedness.  He had previously been followed by cardiologist at [facility] and was previously followed by Dr. Ladona Ridgel but never resumed care.  He was initially diagnosed with ASVD at age 64 and underwent surgery 03/1999 for partial a VSD repair by Dr. Loleta Chance.  He underwent a left heart cath in 2017  that showed nonobstructive CAD.  He was diagnosed with atrial flutter with RVR in 12/2018 and underwent TEE guided cardioversion and was started on anticoagulation.  EF was 35-40% with dilated LA/RA and moderate MR. He had an admission in 12/2020 for severe IDA with Hgb 3.9 requiring multiple transfusions .  He had an echo performed 12/2021 showing severe MR and EF of 50-55%mild LVH, moderately reduced RVF, mild RVE, severely dilated LA, & mildly dilated RA.  He underwent a TEE 01/2022 that  showedmild mitral stenosis with mean gradient at HR 60bpm. There is a single papillary muscle visualized to which all mitral valve. He underwent a CMR on 03/2022 indicating moderate MR with mild TVR with mildly reduced EF of 42%.  He was last seen by Dr. Eldridge Dace on 05/2022.  He was referred to the structural heart team and also referred to [facility]. for HTN management.  He was advised by the structural team to get a consultation by Dr. Leafy Ro which took place on 06/2022.  Dr. Leafy Ro concluded that patient would require replacement and recommended mechanical valve but due to patient's history of GI bleeds makes that prohibitive.  He recommended a RHC to assess filling pressures and pulmonary hypertension and if they were elevated would move forward with surgical intervention.  He recommended aggressive medical therapy with BP and diuretics.   He was last seen on 12/31/2022 and had complaints of frequent palpitations with shortness of breath and dizziness.  Patient underwent right heart cath per Dr. Leafy Ro that showed mild nonobstructive CAD with normal right heart pressures.  2D echo was completed for further evaluation of mitral valve that showed severe MR with normal LV/RV function and dilated LA.  He was advised to have repeat echo in 1 year.  Since last being seen in the office patient reports***.  Patient denies chest pain, palpitations, dyspnea, PND, orthopnea, nausea, vomiting, dizziness, syncope, edema, weight gain, or early satiety.     ***Notes: -Patient needs to see social worker during today's visit Needs repeat echo in 1 year Home Medications    Current Outpatient Medications  Medication Sig Dispense Refill   amoxicillin (AMOXIL) 500 MG capsule Take 4 capsules by mouth 30-60 minutes prior to dental work 4 capsule 3   apixaban (ELIQUIS) 5 MG TABS tablet Take 1 tablet (5 mg total) by mouth 2 (two) times daily. 180 tablet 1   aspirin-acetaminophen-caffeine (EXCEDRIN MIGRAINE)  250-250-65 MG tablet Take 1 tablet by mouth daily as needed for headache.     diltiazem (CARDIZEM CD) 240 MG 24 hr capsule TAKE 1 CAPSULE BY MOUTH EVERY DAY 90 capsule 3   ferrous sulfate 325 (65 FE) MG tablet TAKE 1 TABLET BY MOUTH EVERY DAY WITH BREAKFAST 90 tablet 2   lisinopril (ZESTRIL) 10 MG tablet TAKE 1 TABLET BY MOUTH EVERY DAY 90 tablet 0   rosuvastatin (CRESTOR) 20 MG tablet Take 1 tablet (20 mg total) by mouth daily. 90 tablet 3   Current Facility-Administered Medications  Medication Dose Route Frequency Provider Last Rate Last Admin   sodium chloride flush (NS) 0.9 % injection 3 mL  3 mL Intravenous Q12H Gaston Islam., NP         Review of Systems  Please see the history of present illness.    (+)*** (+)***  All other systems reviewed and are otherwise negative except as noted above.  Physical Exam    Wt Readings from Last 3 Encounters:  01/06/23 140 lb (63.5 kg)  12/31/22 139  lb 12.8 oz (63.4 kg)  06/08/22 143 lb (64.9 kg)   WN:IOEVO were no vitals filed for this visit.,There is no height or weight on file to calculate BMI.  Constitutional:      Appearance: Healthy appearance. Not in distress.  Neck:     Vascular: JVD normal.  Pulmonary:     Effort: Pulmonary effort is normal.     Breath sounds: No wheezing. No rales. Diminished in the bases Cardiovascular:     Normal rate. Regular rhythm. Normal S1. Normal S2.      Murmurs: There is no murmur.  Edema:    Peripheral edema absent.  Abdominal:     Palpations: Abdomen is soft non tender. There is no hepatomegaly.  Skin:    General: Skin is warm and dry.  Neurological:     General: No focal deficit present.     Mental Status: Alert and oriented to person, place and time.     Cranial Nerves: Cranial nerves are intact.  EKG/LABS/ Recent Cardiac Studies    ECG personally reviewed by me today - ***   Risk Assessment/Calculations:   {Does this patient have ATRIAL FIBRILLATION?:[id number]}        Lab  Results  Component Value Date   WBC 6.4 12/31/2022   HGB 13.3 01/06/2023   HCT 39.0 01/06/2023   MCV 98 (H) 12/31/2022   PLT 221 12/31/2022   Lab Results  Component Value Date   CREATININE 1.07 12/31/2022   BUN 12 12/31/2022   NA 141 01/06/2023   K 3.8 01/06/2023   CL 107 (H) 12/31/2022   CO2 23 12/31/2022   Lab Results  Component Value Date   ALT 9 04/16/2022   AST 19 04/16/2022   ALKPHOS 61 04/16/2022   BILITOT 0.2 04/16/2022   Lab Results  Component Value Date   CHOL 161 04/16/2022   HDL 52 04/16/2022   LDLCALC 97 04/16/2022   TRIG 59 04/16/2022   CHOLHDL 3.1 04/16/2022    Lab Results  Component Value Date   HGBA1C 5.1 12/04/2018     Assessment & Plan   1.Congenital heart disease: -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  2.Mild nonobstructive CAD: -s/p LHC in 2017 with nonobstructive CAD noted.  3.History of atrial flutter: -Today patient is asymptomatic and controlled a flutter.  4.Essential hypertension: -Patient's blood pressure today was  5.Fatigue/SOB: -Patient reports      Disposition: Follow-up with Lance Muss, MD or APP in *** months {Are you ordering a CV Procedure (e.g. stress test, cath, DCCV, TEE, etc)?   Press F2        :[id number]}   Medication Adjustments/Labs and Tests Ordered: Current medicines are reviewed at length with the patient today.  Concerns regarding medicines are outlined above.   Signed, Napoleon Form, Leodis Rains, NP 04/01/2023, 1:48 PM [facility]

## 2023-04-02 ENCOUNTER — Ambulatory Visit: Payer: BLUE CROSS/BLUE SHIELD | Attending: Nurse Practitioner | Admitting: Nurse Practitioner

## 2023-04-02 ENCOUNTER — Telehealth (HOSPITAL_COMMUNITY): Payer: Self-pay | Admitting: Licensed Clinical Social Worker

## 2023-04-02 ENCOUNTER — Other Ambulatory Visit: Payer: Self-pay | Admitting: *Deleted

## 2023-04-02 DIAGNOSIS — I059 Rheumatic mitral valve disease, unspecified: Secondary | ICD-10-CM

## 2023-04-02 DIAGNOSIS — Q249 Congenital malformation of heart, unspecified: Secondary | ICD-10-CM

## 2023-04-02 DIAGNOSIS — R0600 Dyspnea, unspecified: Secondary | ICD-10-CM

## 2023-04-02 DIAGNOSIS — I2510 Atherosclerotic heart disease of native coronary artery without angina pectoris: Secondary | ICD-10-CM

## 2023-04-02 DIAGNOSIS — I10 Essential (primary) hypertension: Secondary | ICD-10-CM

## 2023-04-02 NOTE — Telephone Encounter (Signed)
CSW attempted to meet patient at his appointment at [facility] this morning although patient was a no show. CSW sent a list of in network providers as well as a Haematologist to patient in the mail. CSW available to assist patient with navigating his care. Lasandra Beech, LCSW, CCSW-MCS [phone]

## 2023-04-08 ENCOUNTER — Ambulatory Visit: Payer: Self-pay | Admitting: General Surgery

## 2023-04-08 ENCOUNTER — Telehealth: Payer: Self-pay | Admitting: *Deleted

## 2023-04-08 NOTE — Telephone Encounter (Signed)
LAST OFFICE VISIT 12/31/22 / NO FOLLOW UP SCHEDULED

## 2023-04-08 NOTE — Telephone Encounter (Signed)
Pharmacy please advise on holding Eliquis prior to E/O Lipoma Surgery scheduled for TBD . Thank you.

## 2023-04-08 NOTE — H&P (Signed)
Chief Complaint: New Consultation ( Lipoma of right shoulder)     History of Present Illness: Owen Caughlin is a 57 y.o. male who is seen today as an office consultation at the request of Dr. Massie Maroon for evaluation of New Consultation ( Lipoma of right shoulder) .   Patient is a 57 year old male who comes in secondary to a right posterior shoulder lipoma.  He states he has had this previously removed however this did recur.  He states this was several years ago.  He states that its gotten larger.  He complains of no pain.  Patient does have a history of A-fib on Eliquis as well as mitral regurg.  He sees Dr. Eldridge Dace.      Review of Systems: A complete review of systems was obtained from the patient.  I have reviewed this information and discussed as appropriate with the patient.  See HPI as well for other ROS.  Review of Systems  Constitutional:  Negative for fever.  HENT:  Negative for congestion.   Eyes:  Negative for blurred vision.  Respiratory:  Negative for cough, shortness of breath and wheezing.   Cardiovascular:  Negative for chest pain and palpitations.  Gastrointestinal:  Negative for heartburn.  Genitourinary:  Negative for dysuria.  Musculoskeletal:  Negative for myalgias.  Skin:  Negative for rash.  Neurological:  Negative for dizziness and headaches.  Psychiatric/Behavioral:  Negative for depression and suicidal ideas.   All other systems reviewed and are negative.     Medical History: Past Medical History: Diagnosis Date  Hypertension    There is no problem list on file for this patient.   History reviewed. No pertinent surgical history.   Allergies Allergen Reactions  Tensilon [Edrophonium Chloride] Other (See Comments)   Blackout   Current Outpatient Medications on File Prior to Visit Medication Sig Dispense Refill  apixaban (ELIQUIS) 5 mg tablet Take 5 mg by mouth 2 (two) times daily    aspirin-acetaminophen-caffeine (EXCEDRIN MIGRAINE)  250-250-65 mg per tablet Take by mouth    dilTIAZem (CARDIZEM CD) 240 MG CD capsule Take 240 mg by mouth once daily    ferrous sulfate 325 (65 FE) MG tablet Take by mouth    lisinopriL (ZESTRIL) 10 MG tablet Take 1 tablet by mouth once daily    rosuvastatin (CRESTOR) 20 MG tablet Take 20 mg by mouth once daily    No current facility-administered medications on file prior to visit.   History reviewed. No pertinent family history.   Social History  Tobacco Use Smoking Status Never Smokeless Tobacco Never    Social History  Socioeconomic History  Marital status: Divorced Tobacco Use  Smoking status: Never  Smokeless tobacco: Never Vaping Use  Vaping status: Never Used Substance and Sexual Activity  Alcohol use: Not Currently  Drug use: Never  Social Determinants of Health  Financial Resource Strain: Low Risk  (02/16/2023)  Received from [facility]  Overall Financial Resource Strain (CARDIA)   Difficulty of Paying Living Expenses: Not hard at all Food Insecurity: No Food Insecurity (02/16/2023)  Received from [facility]  Hunger Vital Sign   Worried About Running Out of Food in the Last Year: Never true   Ran Out of Food in the Last Year: Never true Transportation Needs: No Transportation Needs (02/16/2023)  Received from [facility] - Transportation   Lack of Transportation (Medical): No   Lack of Transportation (Non-Medical): No Physical Activity: Inactive (03/12/2022)  Received from [facility]  Exercise Vital Sign  Days of Exercise per Week: 0 days   Minutes of Exercise per Session: 0 min Stress: No Stress Concern Present (03/12/2022)  Received from [facility] - Occupational Stress Questionnaire   Feeling of Stress : Not at all  Received from [facility]  Social Network   Objective:   Vitals:  04/08/23 1523 BP: 131/83 Pulse: 87 Temp: 36.6 C (97.9 F) Weight: 65.2 kg (143 lb 12.8  oz) Height: 175.3 cm (5\' 9" ) PainSc: 0-No pain   Body mass index is 21.24 kg/m.  Physical Exam Constitutional:      General: He is not in acute distress.    Appearance: Normal appearance.  HENT:     Head: Normocephalic.     Nose: No rhinorrhea.     Mouth/Throat:     Mouth: Mucous membranes are moist.     Pharynx: Oropharynx is clear.  Eyes:     General: No scleral icterus.    Pupils: Pupils are equal, round, and reactive to light.  Cardiovascular:     Rate and Rhythm: Normal rate.     Pulses: Normal pulses.  Pulmonary:     Effort: Pulmonary effort is normal. No respiratory distress.     Breath sounds: No stridor. No wheezing.  Abdominal:     General: Abdomen is flat. There is no distension.     Tenderness: There is no abdominal tenderness. There is no guarding or rebound.  Musculoskeletal:        General: Normal range of motion.     Cervical back: Normal range of motion and neck supple.  Skin:    General: Skin is warm and dry.     Capillary Refill: Capillary refill takes less than 2 seconds.     Coloration: Skin is not jaundiced.           Comments: 6cm mass, soft  Neurological:     General: No focal deficit present.     Mental Status: He is alert and oriented to person, place, and time. Mental status is at baseline.  Psychiatric:        Mood and Affect: Mood normal.        Thought Content: Thought content normal.        Judgment: Judgment normal.       Assessment and Plan: Diagnoses and all orders for this visit:  Lipoma of back    Ammaar Peaslee is a 57 y.o. male   1. Patient with a right shoulder lipoma.  This is approximately 6 x 6 cm.  Soft, mobile. 2. Patient would like to proceed to the operating for excision of the mass.  I discussed with him the risks and benefits of the procedure to include but not limited to: Infection, bleeding, damage to surrounding structures, possible recurrence.  Patient voiced understanding wishes to proceed.      No  follow-ups on file.  Axel Filler, MD, [facility], [facility]

## 2023-04-08 NOTE — Telephone Encounter (Signed)
   Pre-operative Risk Assessment    Patient Name: Terry Golden.  DOB: 1966-05-19 MRN: [id number]      Request for Surgical Clearance    Procedure:   E/O LIPOMA SURGERY  Date of Surgery:  Clearance TBD                                 Surgeon:  Jeryl Columbia, MD Surgeon's Group or Practice Name:  [facility] Phone number:  [phone] Fax number:  [phone]   Type of Clearance Requested:   - Medical  - Pharmacy:  Hold Apixaban (Eliquis) NOT INDICATED HOW LONG   Type of Anesthesia:  General    Additional requests/questions:    Wilhemina Cash   04/08/2023, 3:55 PM

## 2023-04-09 ENCOUNTER — Telehealth: Payer: Self-pay

## 2023-04-09 NOTE — Telephone Encounter (Signed)
Patient scheduled for tele visit on 04/12/23. Med rec and consent done

## 2023-04-09 NOTE — Telephone Encounter (Signed)
   Name: Spyros Balko.  DOB: 1965/12/20  MRN: [id number]  Primary Cardiologist: Lance Muss, MD   Preoperative team, please contact this patient and set up a phone call appointment for further preoperative risk assessment. Please obtain consent and complete medication review. Thank you for your help. Last sen May 2024.    I confirm that guidance regarding antiplatelet and oral anticoagulation therapy has been completed and, if necessary, noted below.  Per office protocol, patient can hold Eliquis for 2 days prior to procedure.       Joni Reining, NP 04/09/2023, 12:04 PM [facility]

## 2023-04-09 NOTE — Telephone Encounter (Signed)
  Patient Consent for Virtual Visit         Terry Golden. has provided verbal consent on 04/09/2023 for a virtual visit (video or telephone).   CONSENT FOR VIRTUAL VISIT FOR:  Terry Golden.  By participating in this virtual visit I agree to the following:  I hereby voluntarily request, consent and authorize [facility] and its employed or contracted physicians, physician assistants, nurse practitioners or other licensed health care professionals (the Practitioner), to provide me with telemedicine health care services (the "Services") as deemed necessary by the treating Practitioner. I acknowledge and consent to receive the Services by the Practitioner via telemedicine. I understand that the telemedicine visit will involve communicating with the Practitioner through live audiovisual communication technology and the disclosure of certain medical information by electronic transmission. I acknowledge that I have been given the opportunity to request an in-person assessment or other available alternative prior to the telemedicine visit and am voluntarily participating in the telemedicine visit.  I understand that I have the right to withhold or withdraw my consent to the use of telemedicine in the course of my care at any time, without affecting my right to future care or treatment, and that the Practitioner or I may terminate the telemedicine visit at any time. I understand that I have the right to inspect all information obtained and/or recorded in the course of the telemedicine visit and may receive copies of available information for a reasonable fee.  I understand that some of the potential risks of receiving the Services via telemedicine include:  Delay or interruption in medical evaluation due to technological equipment failure or disruption; Information transmitted may not be sufficient (e.g. poor resolution of images) to allow for appropriate medical decision making by the  Practitioner; and/or  In rare instances, security protocols could fail, causing a breach of personal health information.  Furthermore, I acknowledge that it is my responsibility to provide information about my medical history, conditions and care that is complete and accurate to the best of my ability. I acknowledge that Practitioner's advice, recommendations, and/or decision may be based on factors not within their control, such as incomplete or inaccurate data provided by me or distortions of diagnostic images or specimens that may result from electronic transmissions. I understand that the practice of medicine is not an exact science and that Practitioner makes no warranties or guarantees regarding treatment outcomes. I acknowledge that a copy of this consent can be made available to me via my patient portal [facility] MyChart), or I can request a printed copy by calling the office of [facility].    I understand that my insurance will be billed for this visit.   I have read or had this consent read to me. I understand the contents of this consent, which adequately explains the benefits and risks of the Services being provided via telemedicine.  I have been provided ample opportunity to ask questions regarding this consent and the Services and have had my questions answered to my satisfaction. I give my informed consent for the services to be provided through the use of telemedicine in my medical care

## 2023-04-09 NOTE — Telephone Encounter (Signed)
Patient with diagnosis of afib on Eliquis for anticoagulation.    Procedure: E/O LIPOMA SURGERY  Date of procedure: TBD   CHA2DS2-VASc Score = 3   This indicates a 3.2% annual risk of stroke. The patient's score is based upon: CHF History: 1 HTN History: 1 Diabetes History: 0 Stroke History: 0 Vascular Disease History: 1 Age Score: 0 Gender Score: 0      CrCl 68 ml/min  Per office protocol, patient can hold Eliquis for 2 days prior to procedure.    **This guidance is not considered finalized until pre-operative APP has relayed final recommendations.**

## 2023-04-11 NOTE — Progress Notes (Unsigned)
   Virtual Visit via Telephone Note  ERROR Cancelled , Rescheduled.

## 2023-04-12 ENCOUNTER — Ambulatory Visit: Payer: BLUE CROSS/BLUE SHIELD

## 2023-04-21 NOTE — Progress Notes (Unsigned)
   Virtual Visit via Telephone Note   Patient contacted vital telephone x 3 with no answer. VM left with contact number for patient to call back. No call back received. Will have patient contact to see if he would like to reschedule a virtual visit.    Terry Levering, NP  04/21/2023, 3:53 PM

## 2023-04-22 ENCOUNTER — Ambulatory Visit: Payer: Self-pay | Attending: Cardiovascular Disease

## 2023-05-24 ENCOUNTER — Other Ambulatory Visit: Payer: Self-pay | Admitting: Interventional Cardiology

## 2023-05-24 ENCOUNTER — Other Ambulatory Visit: Payer: Self-pay | Admitting: Nurse Practitioner

## 2023-05-24 NOTE — Telephone Encounter (Signed)
Prescription refill request for Eliquis received. Indication:afib Last office visit:5/24 Scr:1.07  5/24 Age: 57 Weight:63.5  kg  Prescription refilled

## 2023-07-12 ENCOUNTER — Telehealth: Payer: Self-pay | Admitting: Interventional Cardiology

## 2023-07-12 NOTE — Telephone Encounter (Signed)
Patient calling to setup pre opp appt. Please advise

## 2023-07-12 NOTE — Telephone Encounter (Signed)
Patient scheduled with Robin Searing 07-16-23

## 2023-07-12 NOTE — Telephone Encounter (Signed)
  Patient Consent for Virtual Visit    Terry Golden. has provided verbal consent on 07/12/2023 for a virtual visit (video or telephone).   CONSENT FOR VIRTUAL VISIT FOR:  Terry Golden.  By participating in this virtual visit I agree to the following:  I hereby voluntarily request, consent and authorize [facility] and its employed or contracted physicians, physician assistants, nurse practitioners or other licensed health care professionals (the Practitioner), to provide me with telemedicine health care services (the "Services") as deemed necessary by the treating Practitioner. I acknowledge and consent to receive the Services by the Practitioner via telemedicine. I understand that the telemedicine visit will involve communicating with the Practitioner through live audiovisual communication technology and the disclosure of certain medical information by electronic transmission. I acknowledge that I have been given the opportunity to request an in-person assessment or other available alternative prior to the telemedicine visit and am voluntarily participating in the telemedicine visit.  I understand that I have the right to withhold or withdraw my consent to the use of telemedicine in the course of my care at any time, without affecting my right to future care or treatment, and that the Practitioner or I may terminate the telemedicine visit at any time. I understand that I have the right to inspect all information obtained and/or recorded in the course of the telemedicine visit and may receive copies of available information for a reasonable fee.  I understand that some of the potential risks of receiving the Services via telemedicine include:  Delay or interruption in medical evaluation due to technological equipment failure or disruption; Information transmitted may not be sufficient (e.g. poor resolution of images) to allow for appropriate medical decision making by the  Practitioner; and/or  In rare instances, security protocols could fail, causing a breach of personal health information.  Furthermore, I acknowledge that it is my responsibility to provide information about my medical history, conditions and care that is complete and accurate to the best of my ability. I acknowledge that Practitioner's advice, recommendations, and/or decision may be based on factors not within their control, such as incomplete or inaccurate data provided by me or distortions of diagnostic images or specimens that may result from electronic transmissions. I understand that the practice of medicine is not an exact science and that Practitioner makes no warranties or guarantees regarding treatment outcomes. I acknowledge that a copy of this consent can be made available to me via my patient portal [facility] MyChart), or I can request a printed copy by calling the office of  [facility].    I understand that my insurance will be billed for this visit.   I have read or had this consent read to me. I understand the contents of this consent, which adequately explains the benefits and risks of the Services being provided via telemedicine.  I have been provided ample opportunity to ask questions regarding this consent and the Services and have had my questions answered to my satisfaction. I give my informed consent for the services to be provided through the use of telemedicine in my medical care

## 2023-07-14 NOTE — Progress Notes (Signed)
Virtual Visit via Telephone Note   Because of Terry WING Jr.'s co-morbid illnesses, he is at least at moderate risk for complications without adequate follow up.  This format is felt to be most appropriate for this patient at this time.  The patient did not have access to video technology/had technical difficulties with video requiring transitioning to audio format only (telephone).  All issues noted in this document were discussed and addressed.  No physical exam could be performed with this format.  Please refer to the patient's chart for his consent to telehealth for [facility].  Evaluation Performed:  Preoperative cardiovascular risk assessment _____________   Date:  07/16/2023   Patient ID:  Terry Flatten., DOB 27-Nov-1965, MRN [id number] Patient Location:  Home Provider location:   Office  Primary Care Provider:  Barbette Merino, NP Primary Cardiologist:  Terry Muss, MD  Chief Complaint / Patient Profile   57 y.o. 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    He is pending E/P lipoma surgery with Dr. Derrell Golden on TBD and presents today for telephonic preoperative cardiovascular risk assessment.   History of Present Illness    Terry Belville. is a 57 y.o. male who presents via audio/video conferencing for a telehealth visit today.  Pt was last seen in cardiology clinic on 12/31/2022 by Terry Golden. NP.  At that time Terry Flatten. was doing well .  The patient is now pending procedure as outlined above. Since his last visit, he he denies any new cardiac issues.  He denies chest pain, shortness of breath, dizziness, swelling, or issues with bleeding on Eliquis.  He is medically compliant.  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   right   Mild CAD    Mitral valve disease 03/25/1999   s/p mitral valve repair with commissuroplasty    NICM (nonischemic cardiomyopathy) (HCC)    EF 40% in 2020, recovered   Rheumatic fever    Syncope    1999   Typical atrial flutter (HCC)    VSD (ventricular septal defect)    small   Past Surgical History:  Procedure Laterality Date   ASD AND VSD REPAIR     BIOPSY  12/16/2020   Procedure: BIOPSY;  Surgeon: Terry Boop, MD;  Location: [facility] ENDOSCOPY;  Service: Gastroenterology;;   CARDIAC CATHETERIZATION N/A 09/20/2015   Procedure: Right/Left Heart Cath and Coronary Angiography;  Surgeon: Terry Crafts, MD;  Location: [facility] INVASIVE CV LAB;  Service: Cardiovascular;  Laterality: N/A;   CARDIAC SURGERY     s/p partial AVSD repair and cleft mitral valve repair with commissuroplasty   CARDIOVERSION N/A 12/07/2018   Procedure: CARDIOVERSION;  Surgeon: Terry Masson, MD;  Location: [facility] ENDOSCOPY;  Service: Cardiovascular;  Laterality: N/A;   COLONOSCOPY  07/08/2020   ESOPHAGOGASTRODUODENOSCOPY (EGD) WITH PROPOFOL N/A 12/16/2020   Procedure: ESOPHAGOGASTRODUODENOSCOPY (EGD) WITH PROPOFOL;  Surgeon: Terry Boop, MD;  Location: [facility] ENDOSCOPY;  Service: Gastroenterology;  Laterality: N/A;   LIPOMA EXCISION Right 06/19/2020   Procedure: EXCISION SUBCUTANEOUS LIPOMA  RIGHT SHOULDER;  Surgeon: Terry Rudd, MD;  Location: [facility] OR;  Service: General;  Laterality: Right;   MITRAL VALVE REPAIR     RIGHT/LEFT HEART CATH AND CORONARY ANGIOGRAPHY N/A 01/06/2023   Procedure: RIGHT/LEFT HEART CATH AND CORONARY ANGIOGRAPHY;  Surgeon: Terry Crafts, MD;  Location: [facility] INVASIVE CV LAB;  Service: Cardiovascular;  Laterality: N/A;   TEE WITHOUT CARDIOVERSION N/A 12/07/2018   Procedure: TRANSESOPHAGEAL ECHOCARDIOGRAM (TEE);  Surgeon: Terry Masson, MD;  Location: [facility] ENDOSCOPY;  Service: Cardiovascular;  Laterality: N/A;   TEE WITHOUT CARDIOVERSION N/A 01/08/2022   Procedure: TRANSESOPHAGEAL ECHOCARDIOGRAM (TEE);  Surgeon: Terry Sprague, MD;  Location: [facility] ENDOSCOPY;  Service: Cardiovascular;  Laterality: N/A;    Allergies  Allergies  Allergen Reactions   Tensilon [Edrophonium] Other (See Comments)    Blacked out     Home Medications    Prior to Admission medications   Medication Sig Start Date End Date Taking? Authorizing Provider  amoxicillin (AMOXIL) 500 MG capsule Take 4 capsules by mouth 30-60 minutes prior to dental work 12/09/21   Terry Montana, PA-C  aspirin-acetaminophen-caffeine (EXCEDRIN MIGRAINE) [phone] MG tablet Take 1 tablet by mouth daily as needed for headache.    [provider]  diltiazem (CARDIZEM CD) 240 MG 24 hr capsule TAKE 1 CAPSULE BY MOUTH EVERY DAY 02/11/23   Terry Crafts, MD  ELIQUIS 5 MG TABS tablet TAKE 1 TABLET BY MOUTH TWICE A DAY 05/24/23   Terry Islam., NP  ferrous sulfate 325 (65 FE) MG tablet TAKE 1 TABLET BY MOUTH EVERY DAY WITH BREAKFAST 07/22/22   Terry Crafts, MD  lisinopril (ZESTRIL) 10 MG tablet TAKE 1 TABLET BY MOUTH EVERY DAY 05/26/23   Terry Islam., NP  rosuvastatin (CRESTOR) 20 MG tablet Take 1 tablet (20 mg total) by mouth daily. 02/11/23   Terry Crafts, MD    Physical Exam    Vital Signs:  Terry Flatten. does not have vital signs available for review today.  Given telephonic nature of communication, physical exam is limited. AAOx3. NAD. Normal affect.  Speech and respirations are unlabored.  Accessory Clinical Findings    None  Assessment & Plan    1.  Preoperative Cardiovascular Risk Assessment:  According to the Revised Cardiac Risk Index (RCRI), his Perioperative Risk of Major Cardiac Event is (%): 0.4  His Functional Capacity in METs is: 8.23 according to the [facility] Activity Status Index (DASI).   The  patient was advised that if he develops new symptoms prior to surgery to contact our office to arrange for a follow-up visit, and he verbalized understanding.  Per office protocol, patient can hold Eliquis for 2 days prior to procedure.    Therefore, based on ACC/AHA guidelines, patient would be at acceptable risk for the planned procedure without further cardiovascular testing. I will route this recommendation to the requesting party via Epic fax function.   A copy of this note will be routed to requesting surgeon.  Time:   Today, I have spent 10 minutes with the patient with telehealth technology discussing medical history, symptoms, and management plan.     Joni Reining, NP  07/16/2023, 10:09 AM

## 2023-07-16 ENCOUNTER — Ambulatory Visit: Payer: Self-pay | Attending: Cardiology

## 2023-07-16 DIAGNOSIS — Z01818 Encounter for other preprocedural examination: Secondary | ICD-10-CM

## 2023-07-16 DIAGNOSIS — Z01810 Encounter for preprocedural cardiovascular examination: Secondary | ICD-10-CM

## 2023-07-20 ENCOUNTER — Other Ambulatory Visit (HOSPITAL_COMMUNITY): Payer: Self-pay

## 2023-07-20 ENCOUNTER — Telehealth: Payer: Self-pay | Admitting: Pharmacy Technician

## 2023-07-20 NOTE — Telephone Encounter (Signed)
Pharmacy Patient Advocate Encounter   Received notification from CoverMyMeds that prior authorization for eliquis is required/requested.   Insurance verification completed.   The patient is insured through [employer] .   Per test claim: PA required; PA submitted to above mentioned insurance via CoverMyMeds Key/confirmation #/EOC [id number] Status is pending

## 2023-07-20 NOTE — Telephone Encounter (Signed)
Pharmacy Patient Advocate Encounter  Received notification from [employer] that Prior Authorization for eliquis has been APPROVED from 07/20/23 to 07/19/24   PA #/Case ID/Reference #: [id number]

## 2023-08-10 NOTE — Pre-Procedure Instructions (Signed)
 Surgical Instructions   Your procedure is scheduled on August 16, 2023. Report to [facility] Main Entrance A at 11:30 A.M., then check in with the Admitting office. Any questions or running late day of surgery: call [phone]  Questions prior to your surgery date: call [phone], Monday-Friday, 8am-4pm. If you experience any cold or flu symptoms such as cough, fever, chills, shortness of breath, etc. between now and your scheduled surgery, please notify us  at the above number.     Remember:  Do not eat after midnight the night before your surgery   You may drink clear liquids until 10:30 AM the morning of your surgery.   Clear liquids allowed are: Water , Non-Citrus Juices (without pulp), Carbonated Beverages, Clear Tea (no milk, honey, etc.), Black Coffee Only (NO MILK, CREAM OR POWDERED CREAMER of any kind), and Gatorade.    Take these medicines the morning of surgery with A SIP OF WATER : diltiazem  (CARDIZEM  CD)  rosuvastatin  (CRESTOR )    Follow your surgeon's instructions on when to stop ELIQUIS .  If no instructions were given by your surgeon then you will need to call the office to get those instructions.     One week prior to surgery, STOP taking any Aspirin  (unless otherwise instructed by your surgeon) Aleve, Naproxen, Ibuprofen , Motrin , Advil , Goody's, BC's, all herbal medications, fish oil, and non-prescription vitamins.                     Do NOT Smoke (Tobacco/Vaping) for 24 hours prior to your procedure.  If you use a CPAP at night, you may bring your mask/headgear for your overnight stay.   You will be asked to remove any contacts, glasses, piercing's, hearing aid's, dentures/partials prior to surgery. Please bring cases for these items if needed.    Patients discharged the day of surgery will not be allowed to drive home, and someone needs to stay with them for 24 hours.  SURGICAL WAITING ROOM VISITATION Patients may have no more than 2 support people in  the waiting area - these visitors may rotate.   Pre-op nurse will coordinate an appropriate time for 1 ADULT support person, who may not rotate, to accompany patient in pre-op.  Children under the age of 77 must have an adult with them who is not the patient and must remain in the main waiting area with an adult.  If the patient needs to stay at the hospital during part of their recovery, the visitor guidelines for inpatient rooms apply.  Please refer to the [facility] website for the visitor guidelines for any additional information.   If you received a COVID test during your pre-op visit  it is requested that you wear a mask when out in public, stay away from anyone that may not be feeling well and notify your surgeon if you develop symptoms. If you have been in contact with anyone that has tested positive in the last 10 days please notify you surgeon.      Pre-operative CHG Bathing Instructions   You can play a key role in reducing the risk of infection after surgery. Your skin needs to be as free of germs as possible. You can reduce the number of germs on your skin by washing with CHG (chlorhexidine  gluconate) soap before surgery. CHG is an antiseptic soap that kills germs and continues to kill germs even after washing.   DO NOT use if you have an allergy to chlorhexidine /CHG or antibacterial soaps. If your skin becomes  reddened or irritated, stop using the CHG and notify one of our RNs at [phone].              TAKE A SHOWER THE NIGHT BEFORE SURGERY AND THE DAY OF SURGERY    Please keep in mind the following:  DO NOT shave, including legs and underarms, 48 hours prior to surgery.   You may shave your face before/day of surgery.  Place clean sheets on your bed the night before surgery Use a clean washcloth (not used since being washed) for each shower. DO NOT sleep with pet's night before surgery.  CHG Shower Instructions:  Wash your face and private area with normal soap. If  you choose to wash your hair, wash first with your normal shampoo.  After you use shampoo/soap, rinse your hair and body thoroughly to remove shampoo/soap residue.  Turn the water  OFF and apply half the bottle of CHG soap to a CLEAN washcloth.  Apply CHG soap ONLY FROM YOUR NECK DOWN TO YOUR TOES (washing for 3-5 minutes)  DO NOT use CHG soap on face, private areas, open wounds, or sores.  Pay special attention to the area where your surgery is being performed.  If you are having back surgery, having someone wash your back for you may be helpful. Wait 2 minutes after CHG soap is applied, then you may rinse off the CHG soap.  Pat dry with a clean towel  Put on clean pajamas    Additional instructions for the day of surgery: DO NOT APPLY any lotions, deodorants, cologne, or perfumes.   Do not wear jewelry or makeup Do not wear nail polish, gel polish, artificial nails, or any other type of covering on natural nails (fingers and toes) Do not bring valuables to the hospital. [facility] is not responsible for valuables/personal belongings. Put on clean/comfortable clothes.  Please brush your teeth.  Ask your nurse before applying any prescription medications to the skin.

## 2023-08-11 ENCOUNTER — Other Ambulatory Visit: Payer: Self-pay

## 2023-08-11 ENCOUNTER — Ambulatory Visit: Payer: Self-pay | Admitting: General Surgery

## 2023-08-11 ENCOUNTER — Encounter (HOSPITAL_COMMUNITY): Payer: Self-pay

## 2023-08-11 ENCOUNTER — Encounter (HOSPITAL_COMMUNITY)
Admission: RE | Admit: 2023-08-11 | Discharge: 2023-08-11 | Disposition: A | Discharge status: Home / Self Care | Payer: Commercial Managed Care - HMO | Source: Ambulatory Visit | Attending: General Surgery | Admitting: General Surgery

## 2023-08-11 VITALS — BP 143/79 | HR 69 | Temp 98.3°F | Resp 18 | Ht 69.0 in | Wt 150.0 lb

## 2023-08-11 DIAGNOSIS — I483 Typical atrial flutter: Secondary | ICD-10-CM | POA: Insufficient documentation

## 2023-08-11 DIAGNOSIS — I051 Rheumatic mitral insufficiency: Secondary | ICD-10-CM | POA: Diagnosis not present

## 2023-08-11 DIAGNOSIS — Z01812 Encounter for preprocedural laboratory examination: Secondary | ICD-10-CM | POA: Insufficient documentation

## 2023-08-11 DIAGNOSIS — D171 Benign lipomatous neoplasm of skin and subcutaneous tissue of trunk: Secondary | ICD-10-CM | POA: Insufficient documentation

## 2023-08-11 DIAGNOSIS — Z01818 Encounter for other preprocedural examination: Secondary | ICD-10-CM

## 2023-08-11 DIAGNOSIS — Z7901 Long term (current) use of anticoagulants: Secondary | ICD-10-CM | POA: Diagnosis not present

## 2023-08-11 DIAGNOSIS — I4891 Unspecified atrial fibrillation: Secondary | ICD-10-CM | POA: Insufficient documentation

## 2023-08-11 DIAGNOSIS — D649 Anemia, unspecified: Secondary | ICD-10-CM | POA: Insufficient documentation

## 2023-08-11 DIAGNOSIS — Z8774 Personal history of (corrected) congenital malformations of heart and circulatory system: Secondary | ICD-10-CM | POA: Diagnosis not present

## 2023-08-11 DIAGNOSIS — I2510 Atherosclerotic heart disease of native coronary artery without angina pectoris: Secondary | ICD-10-CM | POA: Diagnosis not present

## 2023-08-11 LAB — BASIC METABOLIC PANEL
Anion gap: 9 (ref 5–15)
BUN: 13 mg/dL (ref 6–20)
CO2: 28 mmol/L (ref 22–32)
Calcium: 9.3 mg/dL (ref 8.9–10.3)
Chloride: 106 mmol/L (ref 98–111)
Creatinine, Ser: 1.13 mg/dL (ref 0.61–1.24)
GFR, Estimated: >60 mL/min (ref >60)
Glucose, Bld: 85 mg/dL (ref 70–99)
Potassium: 4.8 mmol/L (ref 3.5–5.1)
Sodium: 143 mmol/L (ref 135–145)

## 2023-08-11 LAB — CBC
HCT: 41 % (ref 39.0–52.0)
Hemoglobin: 14 g/dL (ref 13.0–17.0)
MCH: 33.3 pg (ref 26.0–34.0)
MCHC: 34.1 g/dL (ref 30.0–36.0)
MCV: 97.6 fL (ref 80.0–100.0)
Platelets: 222 10*3/uL (ref 150–400)
RBC: 4.2 MIL/uL — ABNORMAL LOW (ref 4.22–5.81)
RDW: 12.7 % (ref 11.5–15.5)
WBC: 5.1 10*3/uL (ref 4.0–10.5)
nRBC: 0 % (ref 0.0–0.2)

## 2023-08-11 NOTE — Progress Notes (Signed)
 PCP and Cardiologist- Dr. Candyce Reek   PPM/ICD - denies   Chest x-ray - 12/14/20 EKG - 12/31/22 Stress Test - 08/19/15 ECHO - 01/12/23 Cardiac Cath - 01/06/23  Sleep Study - denies   DM- denies  Last dose of GLP1 agonist-  n/a   Blood Thinner Instructions: Hold Eliquis  2 days. Last dose 1/10 Aspirin  Instructions: n/a  ERAS Protcol - yes, no drink   COVID TEST- n/a   Anesthesia review: yes, cardiac hx  Patient denies shortness of breath, fever, cough and chest pain at PAT appointment   All instructions explained to the patient, with a verbal understanding of the material. Patient agrees to go over the instructions while at home for a better understanding.  The opportunity to ask questions was provided.

## 2023-08-12 NOTE — Anesthesia Preprocedure Evaluation (Addendum)
 Anesthesia Evaluation  Patient identified by MRN, date of birth, ID band Patient awake    Reviewed: Allergy & Precautions, H&P , NPO status , Patient's Chart, lab work & pertinent test results  Airway Mallampati: II   Neck ROM: full    Dental   Pulmonary Current Smoker and Patient abstained from smoking.   breath sounds clear to auscultation       Cardiovascular hypertension, + CAD  + dysrhythmias Atrial Fibrillation  Rhythm:regular Rate:Normal  Mild non-obstructive CAD.  H/o ASD, VSD repair and mitral valve repair 2020.  Pt has recurrent severe central MR, followed by Dr Maryjane who is recommending medical management at this time since RV pressures and function are normal.  EF 40%   Neuro/Psych    GI/Hepatic   Endo/Other    Renal/GU      Musculoskeletal   Abdominal   Peds  Hematology   Anesthesia Other Findings   Reproductive/Obstetrics                             Anesthesia Physical Anesthesia Plan  ASA: 4  Anesthesia Plan: General   Post-op Pain Management:    Induction: Intravenous  PONV Risk Score and Plan: 1 and Ondansetron , Dexamethasone , Treatment may vary due to age or medical condition and Midazolam   Airway Management Planned: LMA  Additional Equipment:   Intra-op Plan:   Post-operative Plan: Extubation in OR  Informed Consent: I have reviewed the patients History and Physical, chart, labs and discussed the procedure including the risks, benefits and alternatives for the proposed anesthesia with the patient or authorized representative who has indicated his/her understanding and acceptance.     Dental advisory given  Plan Discussed with: CRNA, Anesthesiologist and Surgeon  Anesthesia Plan Comments: (Severe MR s/p CHD repair of cleft MV, VSD in 2000- seen by weldner about 1 year ago and not yet a candidate for surgical intervention. Since then has had cath and  echo that show nonobstructive LAD and 2nd diag lesions, wedge 10. Echo at same time (01/2023) severe MR, parachute MV, moderate RV dysfunction, LVEF 50-55%  Cardiac clearance granted with RCRI score of 0.4%. I think he is much higher risk than this and would recommend to surgeon light sedation and local. If general anesthesia needed, will require arterial line. Ultimately will be up to anesthesiologist on day of surgery. )       Anesthesia Quick Evaluation

## 2023-08-12 NOTE — Progress Notes (Signed)
 Reviewed chart w/ PAT. 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 Since then has had cath and echo that show nonobstructive LAD and 2nd diag lesions, wedge 10. Echo at same time (01/2023) severe MR, parachute MV, moderate RV dysfunction, LVEF 50-55%  Cardiac clearance granted with RCRI score of 0.4%. I think he is much higher risk than this and would recommend to surgeon light sedation and local. If general anesthesia needed, will require arterial line. Ultimately will be up to anesthesiologist on day of surgery.

## 2023-08-12 NOTE — Progress Notes (Signed)
 Anesthesia Chart Review:    Case: [id number] Date/Time: 08/16/23 1045   Procedure: EXCISION OF RIGHT SHOULDER LIPOMA (Right: Shoulder)   Anesthesia type: General   Pre-op diagnosis: LIPOMA, RIGHT SHOULDER   [facility] ROOM 02 / [facility]   Surgeons: Rubin Calamity, MD       DISCUSSION: Patient is a 58 year old male scheduled for the above procedure.  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   He was evaluated by CT surgeon Maryjane Mt, MD on 06/08/22 for recurrent MR. He wrote, Pt has a difficult situation in that his level of MR is approaching need for intervention however with the TEE/MRI findings would likely require replacement secondary to anatomy of valve/papillary muscles. At his age he would best be served with mechanical prosthesis however with his history of GI bleed may make that prohibitive. He understands that with tissue valve replacement, the longevity may not last more than 10 yrs and would require another procedure at that  point.  With his MR currently moderate by MRI and TEE, I would favor obtaining a RHC to assess the level of filling pressures and Pulmonary HTN. If these are elevated, I would recommend surgical intervention. If low then aggressive medical management toward BP control and diuretics would allow pushing procedure in the future when medical therapy fails and hopefully tissue prosthesis at that point at a higher age at implant.   He underwent RHC/LHC on 01/06/23 that showed mild nonobstructive CAD, normal right heart  pressures.  No evidence of CHF or volume overload by RHC.  Repeat echocardiogram ordered to evaluate mitral regurgitation. TTE was done on 01/12/23 and showed LVEF 55 to 60%, no regional wall motion abnormalities, mild concentric LVH, grade 1 diastolic dysfunction, normal RV systolic function, normal RV size, severely dilated LA with history of primum ASD repair, history of MV cleft repair with severe centrally directed mitral regurgitation and as previously noted only 1 prominent papillary muscle visualized consistent with parachute mitral valve (MR peak grad 163.7 mmHg, MR mean grad 96.3 mmHg), trivial TR, trivial PR, no atrial level shunt detected by color-flow Doppler. RVSP 120.5 mmHg per echo report, although normal right heart pressures by 01/06/23 RHC. Dr. Dann reviewed the echo and recommended office follow-up in 6 months with repeat echo in 1 year.  Preoperative telephonic cardiology evaluation was done on 07/16/23 by Jerilynn Collar, DNP. She wrote, According to the Revised Cardiac Risk Index (RCRI), his Perioperative Risk of Major Cardiac Event is (%): 0.4   His Functional Capacity in METs is: 8.23 according to the Duke Activity Status Index (DASI)...   Per office protocol, patient can hold Eliquis  for 2 days prior to procedure.     Therefore, based on ACC/AHA guidelines, patient would be at acceptable risk for the planned procedure without further cardiovascular testing. I will route this recommendation to the requesting party via Epic fax function. He reported last Eliquis  08/13/23.   CBC and BMP were essentially normal on 08/11/23.    Case discussed with anesthesiologist Merla Hun, DO. Anesthesia team to evaluate on the day of surgery to determine definitive anesthesia plan. Consider a-line.  VS: BP (!) 143/79   Pulse 69   Temp 36.8 C (Oral)   Resp 18   Ht 5' 9 (1.753 m)   Wt 68 kg   SpO2 98%   BMI 22.15 kg/m   PROVIDERS: Dann Candyce RAMAN, MD is cardiologist     LABS: Labs reviewed: Acceptable for surgery. (all labs ordered are listed, but only abnormal results are displayed)  Labs Reviewed  CBC - Abnormal; Notable for the following components:      Result Value   RBC 4.20 (*)    All other components within normal limits  BASIC METABOLIC PANEL    EKG: 12/31/2022: Atrial flutter with variable AV block at 84 bpm.  Left anterior fascicular block.  Moderate voltage criteria for LVH, may be normal variant.  ST and T wave abnormality, consider inferior ischemia.  Prolonged QT with QT 392 ms, QTc 463 ms.   CV: TTE 01/12/2023 IMPRESSIONS   1. Left ventricular ejection fraction, by estimation, is 55 to 60%. The  left ventricle has normal function. The left ventricle has no regional  wall motion abnormalities. There is mild concentric left ventricular  hypertrophy. Left ventricular diastolic  parameters are consistent with Grade I diastolic dysfunction (impaired  relaxation).   2. Right ventricular systolic function is normal. The right ventricular  size is normal.   3. Left atrial size was severely dilated. Hx of primum ASD repair.   4. History of MV cleft s/p repair. There is severe centrally directed  mitral regurgitation. As noted previously, only one prominent papillary  muslce visualized consistent with parachute mitral valve.  MV Area (PHT)  cm            MV Decel Time: 160 msec      MR Peak grad:   163.7 mmHg    MR Mean grad:   96.3 mmHg     MR Vmax:        639.67 cm/s  MR Vmean:       456.0 cm/s    MR PISA:        5.09 cm      MR PISA Radius: 0.90 cm        MV E velocity: 187.50 cm/s   5. The tricuspid valve is normal in structure. Tricuspid  valve regurgitation is trivial.  TR Peak grad:   117.5 mmHg  TR Vmax:        542.00 cm/s  Estimated RAP:  3.00 mmHg  RVSP:           120.5 mmHg   6. The aortic valve is normal in structure. Aortic valve regurgitation is  not visualized. No aortic stenosis is present.   7. The inferior  vena cava is normal in size with greater than 50%  respiratory variability, suggesting right atrial pressure of 3 mmHg.    RHC/LHC 01/06/2023:   Mid LAD lesion is 30% stenosed.   2nd Diag lesion is 40% stenosed.   LV end diastolic pressure is normal.   There is no aortic valve stenosis.   Recommend to resume Apixaban , at currently prescribed dose and frequency on 01/07/2023.   Concurrent antiplatelet therapy not recommended.   Aortic saturation 91%, PA saturation 66%, both on room air; mean RA pressure 3 mmHg, PA pressure 28/11, mean PA pressure 17 mmHg, pulmonary capillary wedge pressure 8/14, mean pulmonary capillary wedge pressure 10 mmHg, cardiac output 4.87 L/min, cardiac index 2.74.  V waves noted on the wedge tracing.   Mild, nonobstructive coronary  artery disease.  Continue aggressive secondary prevention.  Normal right heart pressures.   Plan for echocardiogram to further evaluate mitral regurgitation.  No evidence of congestive heart failure or volume overload by right heart catheterization.    MRI Cardiac 03/30/2022: IMPRESSION: 1. Mild LV dilatation with mild systolic dysfunction (EF 42%) 2.  Mild RV dilatation with mild systolic dysfunction (EF 41%) 3. Status post atrioventricular septal defect repair. Qp/Qs 1.0, indicating no residual shunt 4.  Moderate mitral regurgitation (regurgitant fraction 20%) 5. Mild tricuspid regurgitation (regurgitant fraction 14%)    TEE 01/08/2022: IMPRESSIONS   1. History of primum ASD with MV cleft s/p repair. The mitral valve  leaflets appear thickened and restricted. There is mild mitral stenosis  with mean gradient at HR 60bpm. There is a single papillary muscle  visualized to which all mitral valve  chords appear to be attached. 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 There is moderate mitral regurgitation by PISA with EROA 0.24cm2,  RVol 52mL. 3D VC 0.3cm. There is systolic blunting in the LUPV. During  interrogation of  the RUPV and RLPV the   patient appeared to enter a junctional rhythm with a short atrial  reversal wave without true systolic flow reversal visualized (although  systolic flow reversal was clearly seen on surface echo). Given discrepant  values and possible underestimation of MR   on current study (may be related to lower blood pressures in the setting  of sedation), recommend CMR for further evaluation. Could also consider  RHC to assess PA pressures.   2. Left ventricular ejection fraction, by estimation, is 50 to 55%. The  left ventricle has low normal function.   3. Patient is s/p VSD repair with no residual shunting visualized by  color doppler.   4. Right ventricular systolic function is moderately reduced. The right  ventricular size is mildly enlarged.   5. Left atrial size was severely dilated. No left atrial/left atrial  appendage thrombus was detected.   6. Right atrial size was severely dilated.   7. The patient is s/p primum ASD repair. There appears to be two  tricuspid leaflets present following repair. One of the leaflets is  elongated and overriding compared to the other. There is mild tricuspid  regurgitation. There is no tricuspid stenosis.  The tricuspid valve is abnormal.  TR Peak grad:   23.8 mmHg  TR Vmax:        244.00 cm/s   8. The aortic valve is tricuspid. Aortic valve regurgitation is not  visualized. No aortic stenosis is present.   9. Patient is s/p primum ASD repair. No residual shunting detected by  color flow doppler.     Event monitor 11/26/2017: Normal sinus rhythm with rare PACs and PVCs. Short runs of atrial tachycardia, longest was 11 beats. No sustained arrthyhmias.    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   right   Mild CAD    Mitral valve disease 03/25/1999   s/p mitral valve  repair with commissuroplasty    NICM (nonischemic cardiomyopathy) (HCC)    EF 40% in 2020, recovered   Rheumatic fever    Syncope    1999   Typical atrial flutter (HCC)    VSD (ventricular septal defect)    small  Past Surgical History:  Procedure Laterality Date   ASD AND VSD REPAIR     BIOPSY  12/16/2020   Procedure: BIOPSY;  Surgeon: Avram Lupita BRAVO, MD;  Location: [facility];  Service: Gastroenterology;;   CARDIAC CATHETERIZATION N/A 09/20/2015   Procedure: Right/Left Heart Cath and Coronary Angiography;  Surgeon: Candyce GORMAN Reek, MD;  Location[facility] INVASIVE CV LAB;  Service: Cardiovascular;  Laterality: N/A;   CARDIAC SURGERY     s/p partial AVSD repair and cleft mitral valve repair with commissuroplasty   CARDIOVERSION N/A 12/07/2018   Procedure: CARDIOVERSION;  Surgeon: Maranda Leim DEL, MD;  Location: [facility] ENDOSCOPY;  Service: Cardiovascular;  Laterality: N/A;   COLONOSCOPY  07/08/2020   ESOPHAGOGASTRODUODENOSCOPY (EGD) WITH PROPOFOL  N/A 12/16/2020   Procedure: ESOPHAGOGASTRODUODENOSCOPY (EGD) WITH PROPOFOL ;  Surgeon: Avram Lupita BRAVO, MD;  Location: [facility];  Service: Gastroenterology;  Laterality: N/A;   LIPOMA EXCISION Right 06/19/2020   Procedure: EXCISION SUBCUTANEOUS LIPOMA RIGHT SHOULDER;  Surgeon: Belinda Cough, MD;  Location: [facility] OR;  Service: General;  Laterality: Right;   MITRAL VALVE REPAIR     RIGHT/LEFT HEART CATH AND CORONARY ANGIOGRAPHY N/A 01/06/2023   Procedure: RIGHT/LEFT HEART CATH AND CORONARY ANGIOGRAPHY;  Surgeon: Reek Candyce GORMAN, MD;  Location: [facility] INVASIVE CV LAB;  Service: Cardiovascular;  Laterality: N/A;   TEE WITHOUT CARDIOVERSION N/A 12/07/2018   Procedure: TRANSESOPHAGEAL ECHOCARDIOGRAM (TEE);  Surgeon: Maranda Leim DEL, MD;  Location: [facility] ENDOSCOPY;  Service: Cardiovascular;  Laterality: N/A;   TEE WITHOUT CARDIOVERSION N/A 01/08/2022   Procedure: TRANSESOPHAGEAL ECHOCARDIOGRAM (TEE);  Surgeon: Hobart Powell BRAVO, MD;  Location: [facility]  ENDOSCOPY;  Service: Cardiovascular;  Laterality: N/A;    MEDICATIONS:  amoxicillin  (AMOXIL ) 500 MG capsule   aspirin -acetaminophen -caffeine (EXCEDRIN MIGRAINE) 250-250-65 MG tablet   diltiazem  (CARDIZEM  CD) 240 MG 24 hr capsule   ELIQUIS  5 MG TABS tablet   ferrous sulfate  325 (65 FE) MG tablet   lisinopril  (ZESTRIL ) 10 MG tablet   rosuvastatin  (CRESTOR ) 20 MG tablet   No current facility-administered medications for this encounter.    Isaiah Ruder, PA-C Surgical Short Stay/Anesthesiology [facility] Phone [phone] [facility] Phone [phone] 08/12/2023 4:24 PM

## 2023-08-13 NOTE — Progress Notes (Signed)
 left voicemail new arrival time of 0900 on monday and clear liquids okay until 0800

## 2023-08-16 ENCOUNTER — Ambulatory Visit (HOSPITAL_BASED_OUTPATIENT_CLINIC_OR_DEPARTMENT_OTHER): Payer: Self-pay | Admitting: Anesthesiology

## 2023-08-16 ENCOUNTER — Ambulatory Visit (HOSPITAL_COMMUNITY)
Admission: RE | Admit: 2023-08-16 | Discharge: 2023-08-16 | Disposition: A | Discharge status: Home / Self Care | Payer: Commercial Managed Care - HMO | Source: Ambulatory Visit | Attending: General Surgery | Admitting: General Surgery

## 2023-08-16 ENCOUNTER — Encounter (HOSPITAL_COMMUNITY)
Admission: RE | Disposition: A | Discharge status: Home / Self Care | Payer: Self-pay | Source: Ambulatory Visit | Attending: General Surgery

## 2023-08-16 ENCOUNTER — Ambulatory Visit (HOSPITAL_COMMUNITY): Payer: Self-pay | Admitting: Vascular Surgery

## 2023-08-16 DIAGNOSIS — I5032 Chronic diastolic (congestive) heart failure: Secondary | ICD-10-CM

## 2023-08-16 DIAGNOSIS — I2510 Atherosclerotic heart disease of native coronary artery without angina pectoris: Secondary | ICD-10-CM | POA: Insufficient documentation

## 2023-08-16 DIAGNOSIS — D1721 Benign lipomatous neoplasm of skin and subcutaneous tissue of right arm: Secondary | ICD-10-CM | POA: Insufficient documentation

## 2023-08-16 DIAGNOSIS — I4891 Unspecified atrial fibrillation: Secondary | ICD-10-CM | POA: Diagnosis not present

## 2023-08-16 DIAGNOSIS — Z7901 Long term (current) use of anticoagulants: Secondary | ICD-10-CM | POA: Diagnosis not present

## 2023-08-16 DIAGNOSIS — I110 Hypertensive heart disease with heart failure: Secondary | ICD-10-CM

## 2023-08-16 DIAGNOSIS — I10 Essential (primary) hypertension: Secondary | ICD-10-CM | POA: Diagnosis not present

## 2023-08-16 DIAGNOSIS — F17200 Nicotine dependence, unspecified, uncomplicated: Secondary | ICD-10-CM

## 2023-08-16 HISTORY — PX: LIPOMA EXCISION: SHX5283

## 2023-08-16 SURGERY — EXCISION LIPOMA
Anesthesia: General | Site: Shoulder | Laterality: Right

## 2023-08-16 MED ORDER — SODIUM CHLORIDE 0.9 % IV SOLN: INTRAVENOUS | Status: DC | PRN

## 2023-08-16 MED ORDER — OXYCODONE HCL 5 MG/5ML PO SOLN: 5.0000 mg | Freq: Once | ORAL | Status: DC | PRN

## 2023-08-16 MED ORDER — FENTANYL CITRATE (PF) 100 MCG/2ML IJ SOLN: 25.0000 ug | INTRAMUSCULAR | Status: DC | PRN

## 2023-08-16 MED ORDER — PROPOFOL 10 MG/ML IV BOLUS
INTRAVENOUS | Status: AC
  Filled 2023-08-16: qty 20

## 2023-08-16 MED ORDER — CEFAZOLIN SODIUM-DEXTROSE 2-4 GM/100ML-% IV SOLN
2.0000 g | INTRAVENOUS | Status: AC
  Administered 2023-08-16: 2 g via INTRAVENOUS
  Filled 2023-08-16: qty 100

## 2023-08-16 MED ORDER — SUGAMMADEX SODIUM 200 MG/2ML IV SOLN
INTRAVENOUS | Status: DC | PRN
  Administered 2023-08-16: 200 mg via INTRAVENOUS

## 2023-08-16 MED ORDER — CHLORHEXIDINE GLUCONATE CLOTH 2 % EX PADS: 6.0000 | MEDICATED_PAD | Freq: Once | CUTANEOUS | Status: DC

## 2023-08-16 MED ORDER — BUPIVACAINE-EPINEPHRINE (PF) 0.25% -1:200000 IJ SOLN
INTRAMUSCULAR | Status: AC
  Filled 2023-08-16: qty 30

## 2023-08-16 MED ORDER — ONDANSETRON HCL 4 MG/2ML IJ SOLN: 4.0000 mg | Freq: Four times a day (QID) | INTRAMUSCULAR | Status: DC | PRN

## 2023-08-16 MED ORDER — OXYCODONE HCL 5 MG PO TABS
5.0000 mg | ORAL_TABLET | Freq: Once | ORAL | Status: DC | PRN
Start: 2023-08-16 — End: 2023-08-16

## 2023-08-16 MED ORDER — EPHEDRINE SULFATE-NACL 50-0.9 MG/10ML-% IV SOSY
PREFILLED_SYRINGE | INTRAVENOUS | Status: DC | PRN
  Administered 2023-08-16: 5 mg via INTRAVENOUS

## 2023-08-16 MED ORDER — ROCURONIUM BROMIDE 10 MG/ML (PF) SYRINGE
PREFILLED_SYRINGE | INTRAVENOUS | Status: DC | PRN
  Administered 2023-08-16: 40 mg via INTRAVENOUS

## 2023-08-16 MED ORDER — LACTATED RINGERS IV SOLN: INTRAVENOUS | Status: DC

## 2023-08-16 MED ORDER — TRAMADOL HCL 50 MG PO TABS: 50.0000 mg | ORAL_TABLET | Freq: Four times a day (QID) | ORAL | 0 refills | Status: AC | PRN

## 2023-08-16 MED ORDER — ACETAMINOPHEN 500 MG PO TABS
1000.0000 mg | ORAL_TABLET | ORAL | Status: AC
  Administered 2023-08-16: 1000 mg via ORAL
  Filled 2023-08-16: qty 2

## 2023-08-16 MED ORDER — PROPOFOL 10 MG/ML IV BOLUS
INTRAVENOUS | Status: DC | PRN
  Administered 2023-08-16: 150 mg via INTRAVENOUS
  Administered 2023-08-16: 50 mg via INTRAVENOUS

## 2023-08-16 MED ORDER — FENTANYL CITRATE (PF) 250 MCG/5ML IJ SOLN
INTRAMUSCULAR | Status: AC
  Filled 2023-08-16: qty 5

## 2023-08-16 MED ORDER — ORAL CARE MOUTH RINSE: 15.0000 mL | Freq: Once | OROMUCOSAL | Status: AC

## 2023-08-16 MED ORDER — DEXAMETHASONE SODIUM PHOSPHATE 10 MG/ML IJ SOLN
INTRAMUSCULAR | Status: DC | PRN
  Administered 2023-08-16: 5 mg via INTRAVENOUS

## 2023-08-16 MED ORDER — MIDAZOLAM HCL 2 MG/2ML IJ SOLN
INTRAMUSCULAR | Status: DC | PRN
  Administered 2023-08-16: 2 mg via INTRAVENOUS

## 2023-08-16 MED ORDER — NOREPINEPHRINE 4 MG/250ML-% IV SOLN
INTRAVENOUS | Status: DC | PRN
  Administered 2023-08-16: 2 ug/min via INTRAVENOUS

## 2023-08-16 MED ORDER — BUPIVACAINE-EPINEPHRINE 0.25% -1:200000 IJ SOLN
INTRAMUSCULAR | Status: DC | PRN
  Administered 2023-08-16: 2 mL

## 2023-08-16 MED ORDER — ONDANSETRON HCL 4 MG/2ML IJ SOLN
INTRAMUSCULAR | Status: DC | PRN
  Administered 2023-08-16: 4 mg via INTRAVENOUS

## 2023-08-16 MED ORDER — FENTANYL CITRATE (PF) 250 MCG/5ML IJ SOLN
INTRAMUSCULAR | Status: DC | PRN
  Administered 2023-08-16: 75 ug via INTRAVENOUS

## 2023-08-16 MED ORDER — LIDOCAINE 2% (20 MG/ML) 5 ML SYRINGE
INTRAMUSCULAR | Status: DC | PRN
  Administered 2023-08-16: 60 mg via INTRAVENOUS

## 2023-08-16 MED ORDER — MIDAZOLAM HCL 2 MG/2ML IJ SOLN
INTRAMUSCULAR | Status: AC
  Filled 2023-08-16: qty 2

## 2023-08-16 MED ORDER — CHLORHEXIDINE GLUCONATE 0.12 % MT SOLN
15.0000 mL | Freq: Once | OROMUCOSAL | Status: AC
  Administered 2023-08-16: 15 mL via OROMUCOSAL
  Filled 2023-08-16: qty 15

## 2023-08-16 MED ORDER — NOREPINEPHRINE BITARTRATE 1 MG/ML IV SOLN
INTRAVENOUS | Status: DC | PRN
  Administered 2023-08-16: .25 mL via INTRAVENOUS

## 2023-08-16 SURGICAL SUPPLY — 29 items
BAG COUNTER SPONGE SURGICOUNT (BAG) ×1 IMPLANT
BLADE CLIPPER SURG (BLADE) IMPLANT
CHLORAPREP W/TINT 26 (MISCELLANEOUS) ×1 IMPLANT
COVER SURGICAL LIGHT HANDLE (MISCELLANEOUS) ×1 IMPLANT
DERMABOND ADVANCED .7 DNX12 (GAUZE/BANDAGES/DRESSINGS) IMPLANT
DRAPE LAPAROTOMY 100X72 PEDS (DRAPES) IMPLANT
DRAPE SURG ORHT 6 SPLT 77X108 (DRAPES) IMPLANT
ELECT REM PT RETURN 9FT ADLT (ELECTROSURGICAL) ×1
ELECTRODE REM PT RTRN 9FT ADLT (ELECTROSURGICAL) ×1 IMPLANT
GAUZE SPONGE 4X4 12PLY STRL (GAUZE/BANDAGES/DRESSINGS) IMPLANT
GLOVE BIO SURGEON STRL SZ7.5 (GLOVE) ×2 IMPLANT
GLOVE BIOGEL PI IND STRL 8 (GLOVE) ×1 IMPLANT
GOWN STRL REUS W/ TWL LRG LVL3 (GOWN DISPOSABLE) ×1 IMPLANT
GOWN STRL REUS W/ TWL XL LVL3 (GOWN DISPOSABLE) ×1 IMPLANT
KIT BASIN OR (CUSTOM PROCEDURE TRAY) ×1 IMPLANT
KIT TURNOVER KIT B (KITS) ×1 IMPLANT
NDL HYPO 25GX1X1/2 BEV (NEEDLE) ×1 IMPLANT
NEEDLE HYPO 25GX1X1/2 BEV (NEEDLE) ×1
NS IRRIG 1000ML POUR BTL (IV SOLUTION) ×1 IMPLANT
PACK GENERAL/GYN (CUSTOM PROCEDURE TRAY) ×1 IMPLANT
PAD ARMBOARD 7.5X6 YLW CONV (MISCELLANEOUS) ×1 IMPLANT
PENCIL SMOKE EVACUATOR (MISCELLANEOUS) ×1 IMPLANT
SPECIMEN JAR SMALL (MISCELLANEOUS) ×1 IMPLANT
SUT MNCRL AB 4-0 PS2 18 (SUTURE) ×1 IMPLANT
SUT VIC AB 3-0 SH 18 (SUTURE) ×1 IMPLANT
SYR CONTROL 10ML LL (SYRINGE) ×1 IMPLANT
TOWEL GREEN STERILE (TOWEL DISPOSABLE) ×1 IMPLANT
TOWEL GREEN STERILE FF (TOWEL DISPOSABLE) ×1 IMPLANT
UNDERPAD 30X36 HEAVY ABSORB (UNDERPADS AND DIAPERS) IMPLANT

## 2023-08-16 NOTE — H&P (Signed)
 Chief Complaint: New Consultation ( Lipoma of right shoulder)       History of Present Illness: Terry Golden is a 58 y.o. male who is seen today as an office consultation at the request of Dr. Leron for evaluation of New Consultation ( Lipoma of right shoulder) .   Patient is a 58 year old male who comes in secondary to a right posterior shoulder lipoma.  He states he has had this previously removed however this did recur.  He states this was several years ago.   He states that its gotten larger.  He complains of no pain.   Patient does have a history of A-fib on Eliquis  as well as mitral regurg.  He sees Dr. dann.           Review of Systems: A complete review of systems was obtained from the patient.  I have reviewed this information and discussed as appropriate with the patient.  See HPI as well for other ROS.   Review of Systems  Constitutional:  Negative for fever.  HENT:  Negative for congestion.   Eyes:  Negative for blurred vision.  Respiratory:  Negative for cough, shortness of breath and wheezing.   Cardiovascular:  Negative for chest pain and palpitations.  Gastrointestinal:  Negative for heartburn.  Genitourinary:  Negative for dysuria.  Musculoskeletal:  Negative for myalgias.  Skin:  Negative for rash.  Neurological:  Negative for dizziness and headaches.  Psychiatric/Behavioral:  Negative for depression and suicidal ideas.   All other systems reviewed and are negative.       Medical History: Past Medical History: DiagnosisDate            Hypertension        There is no problem list on file for this patient.     History reviewed. No pertinent surgical history.    Allergies AllergenReactions Tensilon [Edrophonium Chloride]Other (See Comments)                         Blackout     Current Outpatient Medications on File Prior to Visit MedicationSigDispenseRefill            apixaban  (ELIQUIS ) 5 mg tablet        Take 5 mg by mouth 2 (two) times  daily                                aspirin -acetaminophen -caffeine (EXCEDRIN MIGRAINE) 250-250-65 mg per tablet  Take by mouth                                    dilTIAZem  (CARDIZEM  CD) 240 MG CD capsule     Take 240 mg by mouth once daily                            ferrous sulfate  325 (65 FE) MG tablet            Take by mouth                                    lisinopriL  (ZESTRIL ) 10 MG tablet     Take 1 tablet by mouth once daily  rosuvastatin  (CRESTOR ) 20 MG tablet         Take 20 mg by mouth once daily                      No current facility-administered medications on file prior to visit.     History reviewed. No pertinent family history.    Social History   Tobacco Use Smoking StatusNever Smokeless TobaccoNever     Social History   Socioeconomic History Marital status:Divorced Tobacco Use Smoking status:Never Smokeless tobacco:Never Vaping Use Vaping status:Never Used Substance and Sexual Activity Alcohol use:Not Currently Drug ldz:Wzczm   Social Determinants of Health   Financial Resource Strain: Low Risk  (02/16/2023)             Received from [facility]             Overall Financial Resource Strain (CARDIA)                        Difficulty of Paying Living Expenses: Not hard at all Food Insecurity: No Food Insecurity (02/16/2023)             Received from [facility]             Hunger Vital Sign                        Worried About Running Out of Food in the Last Year: Never true                        Ran Out of Food in the Last Year: Never true Transportation Needs: No Transportation Needs (02/16/2023)             Received from [facility] - Transportation                        Lack of Transportation (Medical): No                        Lack of Transportation (Non-Medical): No Physical Activity: Inactive (03/12/2022)             Received from [facility]             Exercise Vital Sign                         Days of Exercise per Week: 0 days                        Minutes of Exercise per Session: 0 min Stress: No Stress Concern Present (03/12/2022)             Received from [facility] - Occupational Stress Questionnaire                        Feeling of Stress : Not at all             Received from [facility]             Social Network     Objective:     Vitals:  BP 132/86   Pulse 68  Temp 97.9 F (36.6 C) (Oral)   Resp 18   Ht 5' 9 (1.753 m)   Wt 68 kg   SpO2 96%   BMI 22.15 kg/m     Physical Exam Constitutional:      General: He is not in acute distress.    Appearance: Normal appearance.  HENT:     Head: Normocephalic.     Nose: No rhinorrhea.     Mouth/Throat:     Mouth: Mucous membranes are moist.     Pharynx: Oropharynx is clear.  Eyes:     General: No scleral icterus.    Pupils: Pupils are equal, round, and reactive to light.  Cardiovascular:     Rate and Rhythm: Normal rate.     Pulses: Normal pulses.  Pulmonary:     Effort: Pulmonary effort is normal. No respiratory distress.     Breath sounds: No stridor. No wheezing.  Abdominal:     General: Abdomen is flat. There is no distension.     Tenderness: There is no abdominal tenderness. There is no guarding or rebound.  Musculoskeletal:        General: Normal range of motion.     Cervical back: Normal range of motion and neck supple.  Skin:    General: Skin is warm and dry.     Capillary Refill: Capillary refill takes less than 2 seconds.     Coloration: Skin is not jaundiced.            Comments: 6cm mass, soft  Neurological:     General: No focal deficit present.     Mental Status: He is alert and oriented to person, place, and time. Mental status is at baseline.  Psychiatric:        Mood and Affect: Mood normal.        Thought Content: Thought content normal.        Judgment: Judgment normal.          Assessment and  Plan: Diagnoses and all orders for this visit:   Lipoma of back     Terry Golden is a 58 y.o. male    1.         Patient with a right shoulder lipoma.  This is approximately 6 x 6 cm.  Soft, mobile. 2.         Patient would like to proceed to the operating for excision of the mass.  I discussed with him the risks and benefits of the procedure to include but not limited to: Infection, bleeding, damage to surrounding structures, possible recurrence.  Patient voiced understanding wishes to proceed.           No follow-ups on file.   Lynda Leos, MD, [facility], [place] [facility]

## 2023-08-16 NOTE — Anesthesia Procedure Notes (Signed)
 Procedure Name: Intubation Date/Time: 08/16/2023 10:16 AM  Performed by: Lamar Lucie DASEN, CRNAPre-anesthesia Checklist: Patient identified, Emergency Drugs available, Suction available and Patient being monitored Patient Re-evaluated:Patient Re-evaluated prior to induction Oxygen Delivery Method: Circle system utilized Preoxygenation: Pre-oxygenation with 100% oxygen Induction Type: IV induction Ventilation: Mask ventilation without difficulty Laryngoscope Size: Mac and 4 Grade View: Grade II Tube type: Oral Tube size: 7.5 mm Number of attempts: 2 Airway Equipment and Method: Stylet and Oral airway Placement Confirmation: ETT inserted through vocal cords under direct vision, positive ETCO2 and breath sounds checked- equal and bilateral Secured at: 24 cm Tube secured with: Tape Dental Injury: Teeth and Oropharynx as per pre-operative assessment  Comments: Anterior airway

## 2023-08-16 NOTE — Op Note (Signed)
 08/16/2023  10:52 AM  PATIENT:  Terry Golden.  58 y.o. male  PRE-OPERATIVE DIAGNOSIS:  LIPOMA, RIGHT SHOULDER  POST-OPERATIVE DIAGNOSIS:  LIPOMA, RIGHT SHOULDER 7x4cm  PROCEDURE:  Procedure(s): EXCISION OF RIGHT SHOULDER LIPOMA (Right)  SURGEON:  Surgeons and Role:    DEWAINE Rubin Calamity, MD - Primary  ANESTHESIA:   local and general  EBL:  5 mL   BLOOD ADMINISTERED:none  DRAINS: none   LOCAL MEDICATIONS USED:  BUPIVICAINE   SPECIMEN:  Source of Specimen:  right shoulder mass  DISPOSITION OF SPECIMEN:  PATHOLOGY  COUNTS:  YES  TOURNIQUET:  * No tourniquets in log *  DICTATION: .Dragon Dictation  Details of the procedure: After the patient was consented he was taken back to the OR and placed in supine position with bilateral SCDs in place.  He underwent general endotracheal anesthesia.  He was then placed in the left lithotomy position.  He was then prepped and draped in standard fashion.  A timeout was called and all facts verified.  A linear incision was made over the area of the mass.  Dissection using cautery was used to maintain hemostasis and taking down to the mass itself.  This was circumferentially dissected away from the surrounding tissue.  The entirety of the mass appeared to be delivered into the wound.  This was removed.  This was measured.  This measured 7 x 4 cm.  This was all in the subcutaneous space.  At this time there was checked for hemostasis which was excellent.  At this time 3 -0 Vicryl's were used in interrupted fashion reapproximate the deep dermal layer.  The skin was reapproximated using 4-0 Monocryl subcuticular fashion.  The skin was dressed with Dermabond.  Patient tolerated the procedure well was taken to the recovery in stable condition.   PLAN OF CARE: Discharge to home after PACU  PATIENT DISPOSITION:  PACU - hemodynamically stable.   Delay start of Pharmacological VTE agent (>24hrs) due to surgical blood loss or risk of  bleeding: not applicable

## 2023-08-16 NOTE — Transfer of Care (Signed)
 Immediate Anesthesia Transfer of Care Note  Patient: Terry Golden.  Procedure(s) Performed: EXCISION OF RIGHT SHOULDER LIPOMA (Right: Shoulder)  Patient Location: PACU  Anesthesia Type:General  Level of Consciousness: awake, drowsy, and patient cooperative  Airway & Oxygen Therapy: Patient Spontanous Breathing and Patient connected to face mask oxygen  Post-op Assessment: Report given to RN, Post -op Vital signs reviewed and stable, and Patient moving all extremities X 4  Post vital signs: Reviewed and stable  Last Vitals:  Vitals Value Taken Time  BP 141/80 08/16/23 1106  Temp    Pulse 75 08/16/23 1109  Resp 11 08/16/23 1109  SpO2 96 % 08/16/23 1109  Vitals shown include unfiled device data.  Last Pain:  Vitals:   08/16/23 0935  TempSrc:   PainSc: 0-No pain         Complications: No notable events documented.

## 2023-08-17 ENCOUNTER — Encounter (HOSPITAL_COMMUNITY): Payer: Self-pay | Admitting: General Surgery

## 2023-08-17 LAB — SURGICAL PATHOLOGY

## 2023-08-17 NOTE — Anesthesia Postprocedure Evaluation (Signed)
 Anesthesia Post Note  Patient: Terry Golden.  Procedure(s) Performed: EXCISION OF RIGHT SHOULDER LIPOMA (Right: Shoulder)     Patient location during evaluation: PACU Anesthesia Type: General Level of consciousness: awake and alert Pain management: pain level controlled Vital Signs Assessment: post-procedure vital signs reviewed and stable Respiratory status: spontaneous breathing, nonlabored ventilation, respiratory function stable and patient connected to nasal cannula oxygen Cardiovascular status: blood pressure returned to baseline and stable Postop Assessment: no apparent nausea or vomiting Anesthetic complications: no   No notable events documented.  Last Vitals:  Vitals:   08/16/23 1115 08/16/23 1130  BP: 128/71 131/80  Pulse: 63 60  Resp: 20 18  Temp:  36.5 C  SpO2: 93% 99%    Last Pain:  Vitals:   08/16/23 1130  TempSrc:   PainSc: 0-No pain                 Arron Mcnaught S

## 2023-12-27 NOTE — Progress Notes (Deleted)
 Cardiology Office Note:    Date:  12/27/2023   ID:  Terry Ferries., DOB May 31, 1966, MRN [id number]  PCP:  Lucendia Rusk, MD   [facility] Providers Cardiologist:  Avery Bodo, MD Electrophysiologist:  Jolly Needle, MD (Inactive) { Click to update primary MD,subspecialty MD or APP then REFRESH:1}    Referring MD: Lucendia Rusk, MD   No chief complaint on file. ***  History of Present Illness:    Terry Ohlin. is a 58 y.o. male with a hx of rheumatic fever, ASD and VSD. 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  He also has a history of paroxysmal atrial flutter along with significant GI bleed with no specific source identified.  He is maintained on Cardizem  and anticoagulated with Eliquis . 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  CT surgery felt he may be best suited for mitral valve replacement although given his age would best be served with mechanical valve.  Decision was complicated given his history of GI bleed.  Dr. Honey Lusty recommended RHC to further evaluate pressures to determine urgency of surgical intervention.  He underwent right and left heart catheterization on 01/06/2023 that showed nonobstructive CAD and normal right heart pressures.  Last echocardiogram was 01/12/2023 that showed severe MR but normal LV and RV function.  In the interim he underwent general surgery for removal of lipoma and did well.  He presents today for routine cardiology follow-up.  He is scheduled for his annual echocardiogram on 01/25/2024.   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 -Echocardiogram scheduled for 01/21/2024 -Clinically appears well compensated -He understands he requires SBE  prophylaxis   Paroxysmal atrial flutter - Continue Cardizem  - Was in rate controlled atrial flutter at last clinic visit 12/2022 - EKG today shows   Chronic anticoagulation History of GI bleed - Doing well on Eliquis    Mild nonobstructive CAD - No obstructive disease seen on heart catheterization in 2024 -Continue Crestor  20 mg daily   Hypertension - Maintained on 10 mg lisinopril      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   right   Mild CAD    Mitral valve disease 03/25/1999   s/p mitral valve repair with commissuroplasty    NICM (nonischemic cardiomyopathy) (HCC)    EF 40% in 2020, recovered   Rheumatic fever    Syncope    1999   Typical atrial flutter (HCC)    VSD (ventricular septal defect)    small    Past Surgical History:  Procedure Laterality Date   ASD AND VSD REPAIR     BIOPSY  12/16/2020   Procedure: BIOPSY;  Surgeon: Kenney Peacemaker, MD;  Location: [facility] ENDOSCOPY;  Service: Gastroenterology;;   CARDIAC CATHETERIZATION N/A 09/20/2015   Procedure: Right/Left Heart Cath and Coronary Angiography;  Surgeon: Lucendia Rusk, MD;  Location: [facility] INVASIVE CV LAB;  Service: Cardiovascular;  Laterality: N/A;   CARDIAC SURGERY     s/p partial AVSD repair and cleft mitral valve repair with commissuroplasty   CARDIOVERSION N/A 12/07/2018  Procedure: CARDIOVERSION;  Surgeon: Liza Riggers, MD;  Location: [facility] ENDOSCOPY;  Service: Cardiovascular;  Laterality: N/A;   COLONOSCOPY  07/08/2020   ESOPHAGOGASTRODUODENOSCOPY (EGD) WITH PROPOFOL  N/A 12/16/2020   Procedure: ESOPHAGOGASTRODUODENOSCOPY (EGD) WITH PROPOFOL ;  Surgeon: Kenney Peacemaker, MD;  Location: [facility] ENDOSCOPY;  Service: Gastroenterology;  Laterality: N/A;   LIPOMA EXCISION Right 06/19/2020   Procedure: EXCISION SUBCUTANEOUS LIPOMA RIGHT SHOULDER;   Surgeon: Dareen Ebbing, MD;  Location: [facility] OR;  Service: General;  Laterality: Right;   LIPOMA EXCISION Right 08/16/2023   Procedure: EXCISION OF RIGHT SHOULDER LIPOMA;  Surgeon: Shela Derby, MD;  Location: [facility] OR;  Service: General;  Laterality: Right;   MITRAL VALVE REPAIR     RIGHT/LEFT HEART CATH AND CORONARY ANGIOGRAPHY N/A 01/06/2023   Procedure: RIGHT/LEFT HEART CATH AND CORONARY ANGIOGRAPHY;  Surgeon: Lucendia Rusk, MD;  Location: [facility] INVASIVE CV LAB;  Service: Cardiovascular;  Laterality: N/A;   TEE WITHOUT CARDIOVERSION N/A 12/07/2018   Procedure: TRANSESOPHAGEAL ECHOCARDIOGRAM (TEE);  Surgeon: Liza Riggers, MD;  Location: [facility] ENDOSCOPY;  Service: Cardiovascular;  Laterality: N/A;   TEE WITHOUT CARDIOVERSION N/A 01/08/2022   Procedure: TRANSESOPHAGEAL ECHOCARDIOGRAM (TEE);  Surgeon: Sonny Dust, MD;  Location: [facility] ENDOSCOPY;  Service: Cardiovascular;  Laterality: N/A;    Current Medications: No outpatient medications have been marked as taking for the 01/03/24 encounter (Appointment) with Lamond Pilot, PA.     Allergies:   Tensilon [edrophonium]   Social History   Socioeconomic History   Marital status: Divorced    Spouse name: Not on file   Number of children: 1   Years of education: 12   Highest education level: Not on file  Occupational History   Not on file  Tobacco Use   Smoking status: Every Day    Current packs/day: 0.25    Average packs/day: 0.3 packs/day for 42.0 years (10.5 ttl pk-yrs)    Types: Cigarettes   Smokeless tobacco: Never   Tobacco comments:    3 cigarettes per day( 07/08/20)  Vaping Use   Vaping status: Never Used  Substance and Sexual Activity   Alcohol use: Never    Alcohol/week: 0.0 standard drinks of alcohol   Drug use: No   Sexual activity: Not on file  Other Topics Concern   Not on file  Social History Narrative   Not on file   Social Drivers of Health   Financial Resource Strain: Low Risk  (02/16/2023)    Received [facility]   Overall Financial Resource Strain (CARDIA)    Difficulty of Paying Living Expenses: Not hard at all  Food Insecurity: No Food Insecurity (02/16/2023)   Received from [facility]   Hunger Vital Sign    Worried About Running Out of Food in the Last Year: Never true    Ran Out of Food in the Last Year: Never true  Transportation Needs: No Transportation Needs (02/16/2023)   Received from [facility] - Transportation    Lack of Transportation (Medical): No    Lack of Transportation (Non-Medical): No  Physical Activity: Inactive (03/12/2022)   Received from [facility]   Exercise Vital Sign    Days of Exercise per Week: 0 days    Minutes of Exercise per Session: 0 min  Stress: No Stress Concern Present (03/12/2022)   Received from [facility], [facility] of Occupational [facility] - Occupational Stress Questionnaire    Feeling of Stress : Not at all  Social Connections: Unknown (12/16/2021)   Received from [facility], [facility]   Social Network    Social Network: Not on file     Family History: The patient's ***family history includes Alcohol abuse in an other family member; Arthritis in some other family members; Breast cancer in an other family member; Diabetes in his sister; Heart attack in his father; Heart disease in his sister and other family members; Hypertension in an other family member; Kidney disease in an other family member; Mental illness in his sister and another family member; Stroke in his father and another family member. There is no history of Colon cancer, Colon polyps, Esophageal cancer, Rectal cancer, or Stomach cancer.  ROS:   Please see the history of present illness.    *** All other systems reviewed and are negative.  EKGs/Labs/Other Studies Reviewed:    The following studies were reviewed today: ***      Recent Labs: 08/11/2023: BUN 13; Creatinine, Ser 1.13; Hemoglobin  14.0; Platelets 222; Potassium 4.8; Sodium 143  Recent Lipid Panel    Component Value Date/Time   CHOL 161 04/16/2022 0725   TRIG 59 04/16/2022 0725   HDL 52 04/16/2022 0725   CHOLHDL 3.1 04/16/2022 0725   CHOLHDL 3.7 12/04/2018 0859   VLDL 11 12/04/2018 0859   LDLCALC 97 04/16/2022 0725     Risk Assessment/Calculations:   {Does this patient have ATRIAL FIBRILLATION?:[id number]}  No BP recorded.  {Refresh Note OR Click here to enter BP  :1}***         Physical Exam:    VS:  There were no vitals taken for this visit.    Wt Readings from Last 3 Encounters:  08/16/23 150 lb (68 kg)  08/11/23 150 lb (68 kg)  01/06/23 140 lb (63.5 kg)     GEN: *** Well nourished, well developed in no acute distress HEENT: Normal NECK: No JVD; No carotid bruits LYMPHATICS: No lymphadenopathy CARDIAC: ***RRR, no murmurs, rubs, gallops RESPIRATORY:  Clear to auscultation without rales, wheezing or rhonchi  ABDOMEN: Soft, non-tender, non-distended MUSCULOSKELETAL:  No edema; No deformity  SKIN: Warm and dry NEUROLOGIC:  Alert and oriented x 3 PSYCHIATRIC:  Normal affect   ASSESSMENT:    No diagnosis found. PLAN:    In order of problems listed above:  ***      {Are you ordering a CV Procedure (e.g. stress test, cath, DCCV, TEE, etc)?   Press F2        :[id number]}    Medication Adjustments/Labs and Tests Ordered: Current medicines are reviewed at length with the patient today.  Concerns regarding medicines are outlined above.  No orders of the defined types were placed in this encounter.  No orders of the defined types were placed in this encounter.   There are no Patient Instructions on file for this visit.   Signed, Lamond Pilot, [place]  12/27/2023 2:39 PM    [facility]

## 2024-01-03 ENCOUNTER — Ambulatory Visit: Attending: Physician Assistant | Admitting: Physician Assistant

## 2024-01-25 ENCOUNTER — Ambulatory Visit (HOSPITAL_COMMUNITY)
Admission: RE | Admit: 2024-01-25 | Discharge: 2024-01-25 | Disposition: A | Discharge status: Home / Self Care | Source: Ambulatory Visit | Attending: Internal Medicine | Admitting: Internal Medicine

## 2024-01-25 DIAGNOSIS — I349 Nonrheumatic mitral valve disorder, unspecified: Secondary | ICD-10-CM | POA: Diagnosis not present

## 2024-01-25 DIAGNOSIS — I059 Rheumatic mitral valve disease, unspecified: Secondary | ICD-10-CM | POA: Diagnosis present

## 2024-01-25 LAB — ECHOCARDIOGRAM COMPLETE
Area-P 1/2: 2.78 cm2
Est EF: 55
MV M vel: 6.52 m/s
MV Peak grad: 170 mmHg
MV VTI: 1.52 cm2
Radius: 0.75 cm
S' Lateral: 3.7 cm

## 2024-02-09 ENCOUNTER — Emergency Department (HOSPITAL_COMMUNITY)
Admission: EM | Admit: 2024-02-09 | Discharge: 2024-02-09 | Disposition: A | Discharge status: Home / Self Care | Attending: Emergency Medicine | Admitting: Emergency Medicine

## 2024-02-09 ENCOUNTER — Other Ambulatory Visit: Payer: Self-pay

## 2024-02-09 ENCOUNTER — Emergency Department (HOSPITAL_COMMUNITY)

## 2024-02-09 DIAGNOSIS — Z7982 Long term (current) use of aspirin: Secondary | ICD-10-CM | POA: Diagnosis not present

## 2024-02-09 DIAGNOSIS — R011 Cardiac murmur, unspecified: Secondary | ICD-10-CM | POA: Diagnosis not present

## 2024-02-09 DIAGNOSIS — R42 Dizziness and giddiness: Secondary | ICD-10-CM | POA: Insufficient documentation

## 2024-02-09 DIAGNOSIS — R202 Paresthesia of skin: Secondary | ICD-10-CM | POA: Insufficient documentation

## 2024-02-09 DIAGNOSIS — Z7901 Long term (current) use of anticoagulants: Secondary | ICD-10-CM | POA: Diagnosis not present

## 2024-02-09 DIAGNOSIS — I10 Essential (primary) hypertension: Secondary | ICD-10-CM | POA: Insufficient documentation

## 2024-02-09 DIAGNOSIS — R002 Palpitations: Secondary | ICD-10-CM | POA: Insufficient documentation

## 2024-02-09 LAB — COMPREHENSIVE METABOLIC PANEL WITH GFR
ALT: 9 U/L (ref 0–44)
AST: 16 U/L (ref 15–41)
Albumin: 3.8 g/dL (ref 3.5–5.0)
Alkaline Phosphatase: 53 U/L (ref 38–126)
Anion gap: 9 (ref 5–15)
BUN: 7 mg/dL (ref 6–20)
CO2: 23 mmol/L (ref 22–32)
Calcium: 9.3 mg/dL (ref 8.9–10.3)
Chloride: 106 mmol/L (ref 98–111)
Creatinine, Ser: 0.93 mg/dL (ref 0.61–1.24)
GFR, Estimated: >60 mL/min (ref >60)
Glucose, Bld: 95 mg/dL (ref 70–99)
Potassium: 3.5 mmol/L (ref 3.5–5.1)
Sodium: 138 mmol/L (ref 135–145)
Total Bilirubin: 0.7 mg/dL (ref 0.0–1.2)
Total Protein: 6.9 g/dL (ref 6.5–8.1)

## 2024-02-09 LAB — CBC
HCT: 43.5 % (ref 39.0–52.0)
Hemoglobin: 14.8 g/dL (ref 13.0–17.0)
MCH: 32.9 pg (ref 26.0–34.0)
MCHC: 34 g/dL (ref 30.0–36.0)
MCV: 96.7 fL (ref 80.0–100.0)
Platelets: 245 K/uL (ref 150–400)
RBC: 4.5 MIL/uL (ref 4.22–5.81)
RDW: 12.3 % (ref 11.5–15.5)
WBC: 7 K/uL (ref 4.0–10.5)
nRBC: 0 % (ref 0.0–0.2)

## 2024-02-09 LAB — TROPONIN I (HIGH SENSITIVITY): Troponin I (High Sensitivity): 7 ng/L (ref <18)

## 2024-02-09 LAB — CBG MONITORING, ED: Glucose-Capillary: 92 mg/dL (ref 70–99)

## 2024-02-09 MED ORDER — GABAPENTIN 100 MG PO CAPS: 100.0000 mg | ORAL_CAPSULE | Freq: Three times a day (TID) | ORAL | 0 refills | Status: AC | PRN

## 2024-02-09 NOTE — ED Triage Notes (Signed)
 Pt. Stated, I had a lipoma , right upper arm  in March, it feels numb and tingling in my arm. Im also having light headedness. I had a mitral valve surgery in 2000 and they said it would only last for about 15 years. Also back in June I lost my son , he was hit by a vehicle.

## 2024-02-09 NOTE — Discharge Instructions (Addendum)
 All the blood work and heart markers today were normal.  No sign of there being any heart attacks.  Right now you are in a normal rhythm.  Feel that the pain you are experiencing is most likely from a nerve that was irritated when the lipoma was removed.  You can try the gabapentin  up to 3 times a day as needed for the pain.  It can make you drowsy so you do not drive after taking it

## 2024-02-09 NOTE — ED Provider Notes (Signed)
 [place] EMERGENCY DEPARTMENT AT [facility] Provider Note   CSN: [id number] Arrival date & time: 02/09/24  9178     Patient presents with: No chief complaint on file.   Terry Golden. is a 58 y.o. male.   Patient is a 58 year old male with a history of a VSD and ASD, mitral valve disease after rheumatic fever requiring mitral valve replacement in 2000, atrial flutter on Eliquis , hypertension and nonischemic cardiomyopathy who is presenting today with several complaints.  Patient reports the initial reason he came in today as he has been having a lot of pain in his right arm after a lipoma removal 1 to 2 months ago.  He reports he gets a pain that shoots down his arm and makes his hand feel tingly.  It was so bad last night he was having trouble sleeping.  He denies any new weakness in the arm but also reports over the last several months he has been having episodes of his heart racing and feeling lightheaded which come and go.  The last episode he had was a few days ago.  He has followed up with his cardiologist and had an echo done 2 weeks ago but has not heard the results yet.  He remains compliant with his medication and denies any new medications.  No recent monitoring done of his heart rate.  He denies any chest pain, shortness of breath, nausea or vomiting.  No lower extremity swelling noted.  He did report that his son unfortunately passed away within the last month and he has been having a hard time with that and is really not been eating much and has been sleeping a lot and thinks he has lost some weight.  The history is provided by the patient and medical records.       Prior to Admission medications   Medication Sig Start Date End Date Taking? Authorizing Provider  gabapentin  (NEURONTIN ) 100 MG capsule Take 1 capsule (100 mg total) by mouth 3 (three) times daily as needed. 02/09/24  Yes Doretha Folks, MD  amoxicillin  (AMOXIL ) 500 MG capsule Take 4 capsules by  mouth 30-60 minutes prior to dental work 12/09/21   Dunn, Dayna N, PA-C  aspirin -acetaminophen -caffeine (EXCEDRIN MIGRAINE) 250-250-65 MG tablet Take 1 tablet by mouth daily as needed for headache.    [provider]  diltiazem  (CARDIZEM  CD) 240 MG 24 hr capsule TAKE 1 CAPSULE BY MOUTH EVERY DAY 02/11/23   Dann Candyce RAMAN, MD  ELIQUIS  5 MG TABS tablet TAKE 1 TABLET BY MOUTH TWICE A DAY 05/24/23   Wyn Jackee VEAR Mickey., NP  ferrous sulfate  325 (65 FE) MG tablet TAKE 1 TABLET BY MOUTH EVERY DAY WITH BREAKFAST 07/22/22   Dann Candyce RAMAN, MD  lisinopril  (ZESTRIL ) 10 MG tablet TAKE 1 TABLET BY MOUTH EVERY DAY 05/26/23   Wyn Jackee VEAR Mickey., NP  rosuvastatin  (CRESTOR ) 20 MG tablet Take 1 tablet (20 mg total) by mouth daily. 02/11/23   Dann Candyce RAMAN, MD  traMADol  (ULTRAM ) 50 MG tablet Take 1 tablet (50 mg total) by mouth every 6 (six) hours as needed. 08/16/23 08/15/24  Rubin Calamity, MD    Allergies: Tensilon [edrophonium]    Review of Systems  Updated Vital Signs BP 128/74   Pulse 71   Temp 98.1 F (36.7 C) (Oral)   Resp 11   Ht 5' 9 (1.753 m)   Wt 68 kg   SpO2 100%   BMI 22.15 kg/m   Physical  Exam Vitals and nursing note reviewed.  Constitutional:      General: He is not in acute distress.    Appearance: He is well-developed.  HENT:     Head: Normocephalic and atraumatic.  Eyes:     Conjunctiva/sclera: Conjunctivae normal.     Pupils: Pupils are equal, round, and reactive to light.  Cardiovascular:     Rate and Rhythm: Normal rate and regular rhythm.     Pulses: Normal pulses.     Heart sounds: Murmur heard.  Pulmonary:     Effort: Pulmonary effort is normal. No respiratory distress.     Breath sounds: Normal breath sounds. No wheezing or rales.     Comments: Well-healed midline sternotomy scar Abdominal:     General: There is no distension.     Palpations: Abdomen is soft.     Tenderness: There is no abdominal tenderness. There is no guarding or  rebound.  Musculoskeletal:        General: No tenderness. Normal range of motion.     Cervical back: Normal range of motion and neck supple.     Right lower leg: No edema.     Left lower leg: No edema.     Comments: Scar noted to the posterior right shoulder but full range of motion of the shoulder.  Skin:    General: Skin is warm and dry.     Findings: No erythema or rash.  Neurological:     Mental Status: He is alert and oriented to person, place, and time. Mental status is at baseline.     Sensory: No sensory deficit.     Motor: No weakness.     Gait: Gait normal.     Comments: 5 out of 5 strength in the right hand grip, forearm, bicep and deltoid.  Sensation is intact  Psychiatric:        Behavior: Behavior normal.     Comments: Patient seems mildly depressed with a flat affect     (all labs ordered are listed, but only abnormal results are displayed) Labs Reviewed  COMPREHENSIVE METABOLIC PANEL WITH GFR  CBC  URINALYSIS, ROUTINE W REFLEX MICROSCOPIC  CBG MONITORING, ED  TROPONIN I (HIGH SENSITIVITY)    EKG: EKG Interpretation Date/Time:  Wednesday February 09 2024 08:41:39 EDT Ventricular Rate:  79 PR Interval:  204 QRS Duration:  104 QT Interval:  384 QTC Calculation: 440 R Axis:   -68  Text Interpretation: Sinus rhythm with occasional Premature ventricular complexes and Premature atrial complexes Left axis deviation Left ventricular hypertrophy ( R in aVL , Cornell product , Romhilt-Estes ) No significant change since last tracing When compared with ECG of 14-Dec-2020 12:10, PREVIOUS ECG IS PRESENT Confirmed by Doretha Folks ([id number]) on 02/09/2024 9:40:50 AM  Radiology: [facility] Chest Port 1 View Result Date: 02/09/2024 CLINICAL DATA:  right arm pain and palpitations EXAM: PORTABLE CHEST - 1 VIEW COMPARISON:  Dec 14, 2020 FINDINGS: Lower lung volumes. Likely streaky atelectasis in the lung bases. No focal airspace consolidation, pleural effusion, or pneumothorax. Borderline  cardiomegaly. Sternotomy wires. No acute fracture or destructive lesions. Multilevel thoracic osteophytosis. IMPRESSION: No acute cardiopulmonary abnormality. Electronically Signed   By: Rogelia Myers M.D.   On: 02/09/2024 10:37     Procedures   Medications Ordered in the ED - No data to display  Medical Decision Making Amount and/or Complexity of Data Reviewed Labs: ordered. Decision-making details documented in ED Course. Radiology: ordered and independent interpretation performed. Decision-making details documented in ED Course. ECG/medicine tests: ordered and independent interpretation performed. Decision-making details documented in ED Course.  Risk Prescription drug management.   Pt with multiple medical problems and comorbidities and presenting today with a complaint that caries a high risk for morbidity and mortality.  Here today with various complaints.  Patient's right arm discomfort feel is most likely coming from patient's recent lipoma removal most likely with some nerve involvement with removal.  He has normal function and sensation of the arm without swelling and normal pulses.  Low suspicion for arterial issue or DVT.  Patient has a normal neurologic exam here and low suspicion for stroke.  Secondly patient is complaining of episodes of having palpitations and feeling lightheaded which has been intermittent now for several months.  Recently followed up with cardiology and had an echo done which showed an EF of approximately 50% with severe mitral regurgitation status post valve which looks similar to echo results 1 year ago.  I independently interpreted patient's EKG which shows evidence of LVH but no acute changes.  Low suspicion for ACS at this time.  Concerned the patient is going in and out of atrial fibrillation or flutter as he also has a history of this.  He is anticoagulated and vital signs are stable here.  He denies any shortness of  breath or chest pain and low suspicion for PE, dissection or pericardial tamponade/effusion.  Will ensure troponins and labs are within normal limits. I independently interpreted patient's labs and troponin, CMP and CBC all within normal limits.  I have independently visualized and interpreted pt's images today. CXR wnl.  Low suspicion for a new cardiac process going on and encourage patient to follow-up with his cardiologist.  Patient given gabapentin  for what appears to be nerve pain from lipoma removal.      Final diagnoses:  Palpitations  Paresthesias in right hand    ED Discharge Orders          Ordered    gabapentin  (NEURONTIN ) 100 MG capsule  3 times daily PRN        02/09/24 1328               Doretha Folks, MD 02/09/24 1528

## 2024-02-09 NOTE — ED Notes (Signed)
 Patient Alert and oriented to baseline. Stable and ambulatory to baseline. Patient verbalized understanding of the discharge instructions.  Patient belongings were taken by the patient.

## 2024-02-13 ENCOUNTER — Ambulatory Visit: Payer: Self-pay | Admitting: Cardiology

## 2024-02-16 NOTE — Telephone Encounter (Signed)
 Pt returning call, requesting cb. Appt scheduled w/ MP 7/23

## 2024-02-23 ENCOUNTER — Telehealth: Payer: Self-pay

## 2024-02-23 ENCOUNTER — Other Ambulatory Visit (HOSPITAL_COMMUNITY): Payer: Self-pay

## 2024-02-23 ENCOUNTER — Telehealth: Payer: Self-pay | Admitting: Pharmacy Technician

## 2024-02-23 ENCOUNTER — Ambulatory Visit: Attending: Cardiovascular Disease | Admitting: Cardiology

## 2024-02-23 ENCOUNTER — Encounter: Payer: Self-pay | Admitting: Cardiology

## 2024-02-23 VITALS — BP 150/74 | HR 70 | Ht 69.0 in | Wt 142.4 lb

## 2024-02-23 DIAGNOSIS — I10 Essential (primary) hypertension: Secondary | ICD-10-CM | POA: Diagnosis present

## 2024-02-23 DIAGNOSIS — Q249 Congenital malformation of heart, unspecified: Secondary | ICD-10-CM | POA: Insufficient documentation

## 2024-02-23 DIAGNOSIS — I059 Rheumatic mitral valve disease, unspecified: Secondary | ICD-10-CM | POA: Insufficient documentation

## 2024-02-23 DIAGNOSIS — I5020 Unspecified systolic (congestive) heart failure: Secondary | ICD-10-CM | POA: Insufficient documentation

## 2024-02-23 MED ORDER — DAPAGLIFLOZIN PROPANEDIOL 10 MG PO TABS
10.0000 mg | ORAL_TABLET | Freq: Every day | ORAL | 3 refills | Status: AC
  Filled 2024-02-23 (×2): qty 90, 90d supply, fill #0

## 2024-02-23 MED ORDER — METOPROLOL SUCCINATE ER 100 MG PO TB24
100.0000 mg | ORAL_TABLET | Freq: Every evening | ORAL | 3 refills | Status: AC
Start: 2024-02-23 — End: ?
  Filled 2024-02-23: qty 90, 90d supply, fill #0

## 2024-02-23 MED ORDER — ROSUVASTATIN CALCIUM 20 MG PO TABS
20.0000 mg | ORAL_TABLET | Freq: Every day | ORAL | 3 refills | Status: DC
  Filled 2024-02-23: qty 90, 90d supply, fill #0

## 2024-02-23 MED ORDER — SACUBITRIL-VALSARTAN 24-26 MG PO TABS
1.0000 | ORAL_TABLET | Freq: Two times a day (BID) | ORAL | 3 refills | Status: AC
  Filled 2024-02-23: qty 180, 90d supply, fill #0

## 2024-02-23 MED ORDER — DAPAGLIFLOZIN PROPANEDIOL 10 MG PO TABS: 10.0000 mg | ORAL_TABLET | Freq: Every day | ORAL | 0 refills | Status: DC

## 2024-02-23 NOTE — Patient Instructions (Addendum)
 Medication Instructions:   STOP Diltiazem  HCL   STOP Lisinopril   REFILL on Crestor  (rosuvastatin )  START metoporlol succinate 100 mg daily at night   START Entresto  24-26 mg tablet twice a day   START Farxiga  10 mg daily   *If you need a refill on your cardiac medications before your next appointment, please call your pharmacy*  Lab Work: BMP and Pro BNP  If you have labs (blood work) drawn today and your tests are completely normal, you will receive your results only by: MyChart Message (if you have MyChart) OR A paper copy in the mail If you have any lab test that is abnormal or we need to change your treatment, we will call you to review the results.  Testing/Procedures: Your physician has requested that you have a cardiac catheterization. Cardiac catheterization is used to diagnose and/or treat various heart conditions. Doctors may recommend this procedure for a number of different reasons. The most common reason is to evaluate chest pain. Chest pain can be a symptom of coronary artery disease (CAD), and cardiac catheterization can show whether plaque is narrowing or blocking your heart's arteries. This procedure is also used to evaluate the valves, as well as measure the blood flow and oxygen levels in different parts of your heart. For further information please visit [email or web address] Please follow instruction sheet, as given.  Your physician has requested that you have a TEE. During a TEE, sound waves are used to create images of your heart. It provides your doctor with information about the size and shape of your heart and how well your heart's chambers and valves are working. In this test, a transducer is attached to the end of a flexible tube that's guided down your throat and into your esophagus (the tube leading from you mouth to your stomach) to get a more detailed image of your heart. You are not awake for the procedure. Please see the instruction sheet given to you  today. For further information please visit [email or web address]  Follow-Up: At [facility], you and your health needs are our priority.  As part of our continuing mission to provide you with exceptional heart care, our providers are all part of one team.  This team includes your primary Cardiologist (physician) and Advanced Practice Providers or APPs (Physician Assistants and Nurse Practitioners) who all work together to provide you with the care you need, when you need it.  Your next appointment:   2 week(s) post cath   Provider:   One of our Advanced Practice Providers (APPs): Morse Clause, PA-C  Lamarr Satterfield, NP Miriam Shams, NP  Olivia Pavy, PA-C Josefa Beauvais, NP  Leontine Salen, PA-C Orren Fabry, PA-C  [place], PA-C Ernest Dick, NP  Damien Braver, NP Jon Hails, PA-C  Waddell Donath, PA-C    Dayna Dunn, PA-C  Scott Weaver, PA-C Lum Louis, NP Katlyn West, NP Callie Goodrich, PA-C  Evan Williams, PA-C Sheng Haley, PA-C  Xika Zhao, NP Kathleen Johnson, PA-C   YOU HAVE BEEN REFEREED TO SEE OUR PHARM D IN 2 WEEKS   We recommend signing up for the patient portal called MyChart.  Sign up information is provided on this After Visit Summary.  MyChart is used to connect with patients for Virtual Visits (Telemedicine).  Patients are able to view lab/test results, encounter notes, upcoming appointments, etc.  Non-urgent messages can be sent to your provider as well.   To learn more about what you can do with MyChart, go to [email or web address].  Dear Carlin LOISE Izell Mickey.  You are scheduled for a TEE (Transesophageal Echocardiogram) on Tuesday, July 29 with Dr. MONA.  Please arrive at the [facility] (Main Entrance A) at [facility]: [address] [place], [place] [address] at 8:00 AM (This time is 1 hour(s) before your procedure to ensure your preparation).   Free valet parking service is available. You will check in at ADMITTING.   Nothing to  eat or drink after midnight except a sip of water with medications (see medication instructions below)  MEDICATION INSTRUCTIONS:         :1}HOLD: Dapagliflozin  (Farxiga ) for 3 days prior to the procedure. Last dose on Saturday, July 26.         :1}Continue taking your anticoagulant (blood thinner): Apixaban  (Eliquis ).   You will need to continue this after your procedure until you are told by your provider that it is safe to stop.    LABS: TODAY  FYI:  For your safety, and to allow us  to monitor your vital signs accurately during the surgery/procedure we request: If you have artificial nails, gel coating, SNS etc, please have those removed prior to your surgery/procedure. Not having the nail coverings /polish removed may result in cancellation or delay of your surgery/procedure.  Your support person will be asked to wait in the waiting room during your procedure.  It is OK to have someone drop you off and come back when you are ready to be discharged.  You cannot drive after the procedure and will need someone to drive you home.  Bring your insurance cards.  *Special Note: Every effort is made to have your procedure done on time. Occasionally there are emergencies that occur at the hospital that may cause delays. Please be patient if a delay does occur.    [place] [facility] A DEPT OF [place]. [facility] AT [address] A DEPT OF THE [place]. [facility] [address] [place] [address] Dept: [phone] Loc: [phone]  Buford Bremer.  02/23/2024  You are scheduled for a Cardiac Catheterization on Tuesday, July 29 with Dr. Newman Lawrence.  1. Please arrive at the [facility] (Main Entrance A) at [facility]: [address] [place], [place] [address] at 8:00 AM   Free valet parking service is available. You will check in at ADMITTING. The support person will be asked to wait in the waiting room.  It is OK to have someone drop you off  and come back when you are ready to be discharged.    Special note: Every effort is made to have your procedure done on time. Please understand that emergencies sometimes delay scheduled procedures.  2. Diet: Do not eat solid foods after midnight.  The patient may have clear liquids until 5am upon the day of the procedure.  4. Medication instructions in preparation for your procedure:   Contrast Allergy: No  On the morning of your procedure, take your Aspirin  81 mg and any morning medicines NOT listed above.  You may use sips of water.  5. Plan to go home the same day, you will only stay overnight if medically necessary. 6. Bring a current list of your medications and current insurance cards. 7. You MUST have a responsible person to drive you home. 8. Someone MUST be with you the first 24 hours after you arrive home or your discharge will be delayed. 9. Please wear clothes that are easy to get  on and off and wear slip-on shoes.  Thank you for allowing us  to care for you!   -- [facility]

## 2024-02-23 NOTE — Telephone Encounter (Signed)
 Pharmacy Patient Advocate Encounter   Received notification from Patient Pharmacy that prior authorization for farxiga  10mg  is required/requested.   Insurance verification completed.   The patient is insured through [facility] [place] .   Per test claim: PA required; PA submitted to above mentioned insurance via CoverMyMeds Key/confirmation #/EOC [id number] Status is pending

## 2024-02-23 NOTE — Telephone Encounter (Signed)
 Pharmacy Patient Advocate Encounter  Received notification from [facility] Medicaid that Prior Authorization for Farxiga   has been APPROVED from 02/09/24 to 02/22/25   PA #/Case ID/Reference #: [id number]  Pharmacy aware

## 2024-02-23 NOTE — H&P (View-Only) (Signed)
 Cardiology Office Note:  .   Date:  02/23/2024  ID:  Terry LOISE Izell Mickey., DOB 1965/12/16, MRN [id number] PCP: Dann Candyce RAMAN, MD  [facility] Providers Cardiologist:  Newman Lawrence, MD PCP: Dann Candyce RAMAN, MD  Chief Complaint  Patient presents with   mitral disease   Hypertension     Terry LOISE Izell Mickey. is a 58 y.o. 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 Dann.  Patient underwent repair of primum ASD and cleft mitral valve in 2000 at [facility].  He has been followed regularly with serial echocardiograms.  He underwent right hand after catheterization in 2024 that showed nonobstructive CAD.  While he has had severe central mitral regurgitation noted on echocardiogram CAD, he will be doing well up until recently.  Recently, he has developed significant exertional dyspnea symptoms, dizziness with minimal physical exertion, denies any chest pain.  He denies any leg swelling, orthopnea, PND symptoms.  Reviewed recent echocardiogram results personally.  Separately, patient recently lost his son and has been grieving.  Blood pressure is elevated today.  He is currently not working.     Vitals:   02/23/24 1318  BP: (!) 150/74  Pulse: 70  SpO2: 95%      Review of Systems  Cardiovascular:  Positive for dyspnea on exertion. Negative for chest pain, leg swelling, palpitations and syncope.  Neurological:  Positive for dizziness and light-headedness.        Studies Reviewed: SABRA        EKG 02/09/2024: Sinus rhythm w/frequent PACPVCs LVH Inferolateral T wave inversion, consider ischemia Compared to previous EKG in 12/2022, sinus rhythm has replaced atrial flutter  Labs 02/2024: Hb 14.8 Cr 0.93 Trop HS 7  04/2022: Chol 161, TG 59, HDL 52, LDL 97 TSH 1.7  Echocardiogram  01/2024:  1. Left ventricular ejection fraction, by estimation, is 55%. Left  ventricular ejection fraction by 3D volume is 54 %. The left ventricle has  normal function. The left ventricle has no regional wall motion  abnormalities. There is mild concentric left  ventricular hypertrophy. Left ventricular diastolic parameters are  indeterminate.   2. Right ventricular systolic function is normal. The right ventricular  size is normal. There is normal pulmonary artery systolic pressure.   3. Left atrial size was severely dilated.   4. S/p primum ASD repair. No residual leak.   5. H/o primum ASA with MV cleft s/p repair. There is only one prominent  papillary muscle c/w parachute MV. The MV leaflets are thickened. There is  severe central MR. The mitral valve has been repaired/replaced. Severe  mitral valve regurgitation. No evidence of mitral stenosis. The mean mitral valve gradient is 5.0 mmHg. There is a present in the mitral position. Procedure Date: 03/25/1999.   6. The aortic valve is normal in structure. Aortic valve regurgitation is  not visualized. No aortic stenosis is present.   7. The inferior vena cava is normal in size with greater than 50%  respiratory variability, suggesting right atrial pressure of 3 mmHg.   Echocardiogram 01/2023:  1. Left ventricular ejection fraction, by estimation, is 55 to 60%. The  left ventricle has normal function. The left ventricle has no regional  wall motion abnormalities. There is mild concentric left ventricular  hypertrophy. Left ventricular diastolic  parameters are consistent with Grade I diastolic dysfunction (  impaired  relaxation).   2. Right ventricular systolic function is normal. The right ventricular  size is normal.   3. Left atrial size was severely dilated. Hx of primum ASD repair.   4. History of MV cleft s/p repair. There is severe centrally directed  mitral regurgitation. As noted previously, only one prominent papillary   muslce visualized consistent with parachute mitral valve.   5. The aortic valve is normal in structure. Aortic valve regurgitation is  not visualized. No aortic stenosis is present.   6. The inferior vena cava is normal in size with greater than 50%  respiratory variability, suggesting right atrial pressure of 3 mmHg.   [facility] 01/2023:   Mid LAD lesion is 30% stenosed.   2nd Diag lesion is 40% stenosed.   LV end diastolic pressure is normal.   There is no aortic valve stenosis.   Recommend to resume Apixaban , at currently prescribed dose and frequency on 01/07/2023.   Concurrent antiplatelet therapy not recommended.   Aortic saturation 91%, PA saturation 66%, both on room air; mean RA pressure 3 mmHg, PA pressure 28/11, mean PA pressure 17 mmHg, pulmonary capillary wedge pressure 8/14, mean pulmonary capillary wedge pressure 10 mmHg, cardiac output 4.87 L/min, cardiac index 2.74.  V waves noted on the wedge tracing.   Mild, nonobstructive coronary artery disease.  Continue aggressive secondary prevention.  Normal right heart pressures.   Plan for echocardiogram to further evaluate mitral regurgitation.  No evidence of congestive heart failure or volume overload by right heart catheterization.  Risk Assessment/Calculations:    CHA2DS2-VASc Score = 2  This indicates a 2.2% annual risk of stroke. The patient's score is based upon: CHF History: 1 HTN History: 1 Diabetes History: 0 Stroke History: 0 Vascular Disease History: 0 Age Score: 0 Gender Score: 0     Physical Exam Vitals and nursing note reviewed.  Constitutional:      General: He is not in acute distress. Neck:     Vascular: No JVD.  Cardiovascular:     Rate and Rhythm: Normal rate and regular rhythm. Frequent Extrasystoles are present.    Heart sounds: Murmur heard.     High-pitched blowing holosystolic murmur is present with a grade of 3/6 at the apex.  Pulmonary:     Effort: Pulmonary effort is normal.     Breath  sounds: Normal breath sounds. No wheezing or rales.  Musculoskeletal:     Right lower leg: No edema.     Left lower leg: No edema.      VISIT DIAGNOSES:   ICD-10-CM   1. Mitral valve disease  I05.9 EKG 12-Lead    Ambulatory referral to Cardiothoracic Surgery    Basic Metabolic Panel (BMET)    Pro b natriuretic peptide (BNP)    2. Congenital heart disease  Q24.9 EKG 12-Lead    3. Essential hypertension  I10 EKG 12-Lead    Basic Metabolic Panel (BMET)    Pro b natriuretic peptide (BNP)    4. HFrEF (heart failure with reduced ejection fraction) (HCC)  I50.20 AMB Referral to [facility] Pharm-D       Terry LOISE Izell Mickey. is a 58 y.o. 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 Recommend further workup with TEE in light of need for catheterization. His EF  is only 50-55% in the setting of severe MR.  Therefore, I will treat this as heart failure with reduced ejection fraction. Change diltiazem  240 mg daily to metoprolol  succinate 100 mg daily. Stop lisinopril  10 mg daily.  Start Entresto  24-26 mg twice daily 36-hour after last dose of lisinopril . Also added Farxiga  10 mg daily. Check BMP, proBNP in 1 week. In addition, will refer him to advanced heart failure clinic, as well as cardiovascular thoracic surgery with anticipation of potential further surgical correction for this symptomatic severe mitral regurgitation.  Paroxysmal atrial flutter: Technically, he may not need anticoagulation if only having atrial flutter and been anticoagulation at least 4 weeks after.  However, I suspect he may be having more arrhythmia than atrial flutter alone, including A-fib.  He has been on anticoagulation for a long time and I will continue the  same with Eliquis  5 mg twice daily.    Informed Consent   Shared Decision Making/Informed Consent The risks [stroke (1 in 1000), death (1 in 1000), kidney failure [usually temporary] (1 in 500), bleeding (1 in 200), allergic reaction [possibly serious] (1 in 200)], benefits (diagnostic support and management of coronary artery disease) and alternatives of a cardiac catheterization were discussed in detail with Mr. Fesler and he is willing to proceed.       Meds ordered this encounter  Medications   rosuvastatin  (CRESTOR ) 20 MG tablet    Sig: Take 1 tablet (20 mg total) by mouth daily.    Dispense:  90 tablet    Refill:  3    Dose increase   metoprolol  succinate (TOPROL -XL) 100 MG 24 hr tablet    Sig: Take 1 tablet (100 mg total) by mouth at bedtime. Take with or immediately following a meal.    Dispense:  90 tablet    Refill:  3   sacubitril -valsartan  (ENTRESTO ) 24-26 MG    Sig: Take 1 tablet by mouth 2 (two) times daily.    Dispense:  180 tablet    Refill:  3   dapagliflozin  propanediol (FARXIGA ) 10 MG TABS tablet    Sig: Take 1 tablet (10 mg total) by mouth daily before breakfast.    Dispense:  90 tablet    Refill:  3     I spent 40 minutes in the care of Terry LOISE Izell Mickey. today including reviewing data of prior surgeries, prior cardiology notes, reviewing serial echocardiograms and TEE, discussing further planning in terms of medications as well as referral to heart failure and surgery, coordinating care, and documenting in the encounter.   F/u in 4 weeks  Signed, Newman JINNY Lawrence, MD

## 2024-02-23 NOTE — Telephone Encounter (Signed)
 Medication name/dosage: Samples List: Entresto  24/26 mg  Administration instructions: take one tablet by mouth twice daily   Reason for samples: Reason for samples: new start  Ordering provider: Dr. Elmira  *Once above information entered, route the phone encounter to CV DIV MAG ST SAMPLES and send Teams message to team member assigned to Samples for the day.    Medication name/dosage: Samples List: Farxiga  10 mg  Administration instructions: take one tablet by mouth daily   Reason for samples: Reason for samples: new start  Ordering provider: Dr. Elmira   *Once above information entered, route the phone encounter to CV DIV MAG ST SAMPLES and send Teams message to team member assigned to Samples for the day.

## 2024-02-23 NOTE — Telephone Encounter (Signed)
 Farxiga  10 mg by mouth daily samples is given for 28 days. We do not have Samples for Entresto  currently.

## 2024-02-23 NOTE — Progress Notes (Signed)
 Cardiology Office Note:  .   Date:  02/23/2024  ID:  Terry LOISE Izell Mickey., DOB 1965/12/16, MRN [id number] PCP: Dann Candyce RAMAN, MD  [facility] Providers Cardiologist:  Newman Lawrence, MD PCP: Dann Candyce RAMAN, MD  Chief Complaint  Patient presents with   mitral disease   Hypertension     Terry LOISE Izell Mickey. is a 58 y.o. 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 Dann.  Patient underwent repair of primum ASD and cleft mitral valve in 2000 at [facility].  He has been followed regularly with serial echocardiograms.  He underwent right hand after catheterization in 2024 that showed nonobstructive CAD.  While he has had severe central mitral regurgitation noted on echocardiogram CAD, he will be doing well up until recently.  Recently, he has developed significant exertional dyspnea symptoms, dizziness with minimal physical exertion, denies any chest pain.  He denies any leg swelling, orthopnea, PND symptoms.  Reviewed recent echocardiogram results personally.  Separately, patient recently lost his son and has been grieving.  Blood pressure is elevated today.  He is currently not working.     Vitals:   02/23/24 1318  BP: (!) 150/74  Pulse: 70  SpO2: 95%      Review of Systems  Cardiovascular:  Positive for dyspnea on exertion. Negative for chest pain, leg swelling, palpitations and syncope.  Neurological:  Positive for dizziness and light-headedness.        Studies Reviewed: SABRA        EKG 02/09/2024: Sinus rhythm w/frequent PACPVCs LVH Inferolateral T wave inversion, consider ischemia Compared to previous EKG in 12/2022, sinus rhythm has replaced atrial flutter  Labs 02/2024: Hb 14.8 Cr 0.93 Trop HS 7  04/2022: Chol 161, TG 59, HDL 52, LDL 97 TSH 1.7  Echocardiogram  01/2024:  1. Left ventricular ejection fraction, by estimation, is 55%. Left  ventricular ejection fraction by 3D volume is 54 %. The left ventricle has  normal function. The left ventricle has no regional wall motion  abnormalities. There is mild concentric left  ventricular hypertrophy. Left ventricular diastolic parameters are  indeterminate.   2. Right ventricular systolic function is normal. The right ventricular  size is normal. There is normal pulmonary artery systolic pressure.   3. Left atrial size was severely dilated.   4. S/p primum ASD repair. No residual leak.   5. H/o primum ASA with MV cleft s/p repair. There is only one prominent  papillary muscle c/w parachute MV. The MV leaflets are thickened. There is  severe central MR. The mitral valve has been repaired/replaced. Severe  mitral valve regurgitation. No evidence of mitral stenosis. The mean mitral valve gradient is 5.0 mmHg. There is a present in the mitral position. Procedure Date: 03/25/1999.   6. The aortic valve is normal in structure. Aortic valve regurgitation is  not visualized. No aortic stenosis is present.   7. The inferior vena cava is normal in size with greater than 50%  respiratory variability, suggesting right atrial pressure of 3 mmHg.   Echocardiogram 01/2023:  1. Left ventricular ejection fraction, by estimation, is 55 to 60%. The  left ventricle has normal function. The left ventricle has no regional  wall motion abnormalities. There is mild concentric left ventricular  hypertrophy. Left ventricular diastolic  parameters are consistent with Grade I diastolic dysfunction (  impaired  relaxation).   2. Right ventricular systolic function is normal. The right ventricular  size is normal.   3. Left atrial size was severely dilated. Hx of primum ASD repair.   4. History of MV cleft s/p repair. There is severe centrally directed  mitral regurgitation. As noted previously, only one prominent papillary   muslce visualized consistent with parachute mitral valve.   5. The aortic valve is normal in structure. Aortic valve regurgitation is  not visualized. No aortic stenosis is present.   6. The inferior vena cava is normal in size with greater than 50%  respiratory variability, suggesting right atrial pressure of 3 mmHg.   [facility] 01/2023:   Mid LAD lesion is 30% stenosed.   2nd Diag lesion is 40% stenosed.   LV end diastolic pressure is normal.   There is no aortic valve stenosis.   Recommend to resume Apixaban , at currently prescribed dose and frequency on 01/07/2023.   Concurrent antiplatelet therapy not recommended.   Aortic saturation 91%, PA saturation 66%, both on room air; mean RA pressure 3 mmHg, PA pressure 28/11, mean PA pressure 17 mmHg, pulmonary capillary wedge pressure 8/14, mean pulmonary capillary wedge pressure 10 mmHg, cardiac output 4.87 L/min, cardiac index 2.74.  V waves noted on the wedge tracing.   Mild, nonobstructive coronary artery disease.  Continue aggressive secondary prevention.  Normal right heart pressures.   Plan for echocardiogram to further evaluate mitral regurgitation.  No evidence of congestive heart failure or volume overload by right heart catheterization.  Risk Assessment/Calculations:    CHA2DS2-VASc Score = 2  This indicates a 2.2% annual risk of stroke. The patient's score is based upon: CHF History: 1 HTN History: 1 Diabetes History: 0 Stroke History: 0 Vascular Disease History: 0 Age Score: 0 Gender Score: 0     Physical Exam Vitals and nursing note reviewed.  Constitutional:      General: He is not in acute distress. Neck:     Vascular: No JVD.  Cardiovascular:     Rate and Rhythm: Normal rate and regular rhythm. Frequent Extrasystoles are present.    Heart sounds: Murmur heard.     High-pitched blowing holosystolic murmur is present with a grade of 3/6 at the apex.  Pulmonary:     Effort: Pulmonary effort is normal.     Breath  sounds: Normal breath sounds. No wheezing or rales.  Musculoskeletal:     Right lower leg: No edema.     Left lower leg: No edema.      VISIT DIAGNOSES:   ICD-10-CM   1. Mitral valve disease  I05.9 EKG 12-Lead    Ambulatory referral to Cardiothoracic Surgery    Basic Metabolic Panel (BMET)    Pro b natriuretic peptide (BNP)    2. Congenital heart disease  Q24.9 EKG 12-Lead    3. Essential hypertension  I10 EKG 12-Lead    Basic Metabolic Panel (BMET)    Pro b natriuretic peptide (BNP)    4. HFrEF (heart failure with reduced ejection fraction) (HCC)  I50.20 AMB Referral to [facility] Pharm[facility]       Terry LOISE Izell Mickey. is a 58 y.o. 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 Recommend further workup with TEE in light of need for catheterization. His EF  is only 50-55% in the setting of severe MR.  Therefore, I will treat this as heart failure with reduced ejection fraction. Change diltiazem  240 mg daily to metoprolol  succinate 100 mg daily. Stop lisinopril  10 mg daily.  Start Entresto  24-26 mg twice daily 36-hour after last dose of lisinopril . Also added Farxiga  10 mg daily. Check BMP, proBNP in 1 week. In addition, will refer him to advanced heart failure clinic, as well as cardiovascular thoracic surgery with anticipation of potential further surgical correction for this symptomatic severe mitral regurgitation.  Paroxysmal atrial flutter: Technically, he may not need anticoagulation if only having atrial flutter and been anticoagulation at least 4 weeks after.  However, I suspect he may be having more arrhythmia than atrial flutter alone, including A-fib.  He has been on anticoagulation for a long time and I will continue the  same with Eliquis  5 mg twice daily.    Informed Consent   Shared Decision Making/Informed Consent The risks [stroke (1 in 1000), death (1 in 1000), kidney failure [usually temporary] (1 in 500), bleeding (1 in 200), allergic reaction [possibly serious] (1 in 200)], benefits (diagnostic support and management of coronary artery disease) and alternatives of a cardiac catheterization were discussed in detail with Mr. Fesler and he is willing to proceed.       Meds ordered this encounter  Medications   rosuvastatin  (CRESTOR ) 20 MG tablet    Sig: Take 1 tablet (20 mg total) by mouth daily.    Dispense:  90 tablet    Refill:  3    Dose increase   metoprolol  succinate (TOPROL -XL) 100 MG 24 hr tablet    Sig: Take 1 tablet (100 mg total) by mouth at bedtime. Take with or immediately following a meal.    Dispense:  90 tablet    Refill:  3   sacubitril -valsartan  (ENTRESTO ) 24-26 MG    Sig: Take 1 tablet by mouth 2 (two) times daily.    Dispense:  180 tablet    Refill:  3   dapagliflozin  propanediol (FARXIGA ) 10 MG TABS tablet    Sig: Take 1 tablet (10 mg total) by mouth daily before breakfast.    Dispense:  90 tablet    Refill:  3     I spent 40 minutes in the care of Terry LOISE Izell Mickey. today including reviewing data of prior surgeries, prior cardiology notes, reviewing serial echocardiograms and TEE, discussing further planning in terms of medications as well as referral to heart failure and surgery, coordinating care, and documenting in the encounter.   F/u in 4 weeks  Signed, Newman JINNY Lawrence, MD

## 2024-02-24 ENCOUNTER — Telehealth: Payer: Self-pay | Admitting: *Deleted

## 2024-02-24 NOTE — Telephone Encounter (Signed)
 Cardiac Catheterization scheduled at [facility] for: Tuesday February 29, 2024 10:30 AM/TEE 9 AM Arrival time [facility] Main Entrance A at: 8 AM  Nothing to eat or drink after midnight prior to procedures, may have sips of water for medications  Medication instructions: -Hold:  Farxiga -none starting 02/26/24 until post procedure  Eliquis -none starting 02/27/24 until post procedure -Other usual  morning medications can be taken with sips of water including aspirin  81 mg.  Plan to go home the same day, you will only stay overnight if medically necessary.  You must have responsible adult to drive you home.  Someone must be with you the first 24 hours after you arrive home.  Reviewed procedure instructions with patient, reviewed holding Eliquis  and Farxiga  with patient.

## 2024-02-25 NOTE — Telephone Encounter (Signed)
 error

## 2024-02-28 NOTE — Progress Notes (Signed)
 Spoke to patient and instructed them to come at 0800  and to be NPO after 0000.     Confirmed that patient will have a ride home and someone to stay with them for 24 hours after the procedure.   Medications reviewed. Confirmed patient held Eliquis  since 7/27 and advised to take aspirin  81 mg.   Confirmed patient stopped all GLP-1s and GLP-2s for at least one week before procedure.

## 2024-02-29 ENCOUNTER — Ambulatory Visit (HOSPITAL_COMMUNITY)

## 2024-02-29 ENCOUNTER — Encounter (HOSPITAL_COMMUNITY)
Admission: RE | Disposition: A | Discharge status: Home / Self Care | Payer: Self-pay | Source: Home / Self Care | Attending: Internal Medicine

## 2024-02-29 ENCOUNTER — Ambulatory Visit (HOSPITAL_BASED_OUTPATIENT_CLINIC_OR_DEPARTMENT_OTHER)

## 2024-02-29 ENCOUNTER — Ambulatory Visit (HOSPITAL_COMMUNITY)
Admission: RE | Admit: 2024-02-29 | Discharge: 2024-02-29 | Disposition: A | Discharge status: Home / Self Care | Attending: Internal Medicine | Admitting: Internal Medicine

## 2024-02-29 ENCOUNTER — Other Ambulatory Visit: Payer: Self-pay

## 2024-02-29 DIAGNOSIS — I4891 Unspecified atrial fibrillation: Secondary | ICD-10-CM

## 2024-02-29 DIAGNOSIS — I272 Pulmonary hypertension, unspecified: Secondary | ICD-10-CM | POA: Diagnosis not present

## 2024-02-29 DIAGNOSIS — I110 Hypertensive heart disease with heart failure: Secondary | ICD-10-CM | POA: Diagnosis not present

## 2024-02-29 DIAGNOSIS — I5020 Unspecified systolic (congestive) heart failure: Secondary | ICD-10-CM | POA: Diagnosis not present

## 2024-02-29 DIAGNOSIS — Z8774 Personal history of (corrected) congenital malformations of heart and circulatory system: Secondary | ICD-10-CM | POA: Insufficient documentation

## 2024-02-29 DIAGNOSIS — Z79899 Other long term (current) drug therapy: Secondary | ICD-10-CM | POA: Insufficient documentation

## 2024-02-29 DIAGNOSIS — I081 Rheumatic disorders of both mitral and tricuspid valves: Secondary | ICD-10-CM | POA: Insufficient documentation

## 2024-02-29 DIAGNOSIS — I5032 Chronic diastolic (congestive) heart failure: Secondary | ICD-10-CM

## 2024-02-29 DIAGNOSIS — I4892 Unspecified atrial flutter: Secondary | ICD-10-CM | POA: Insufficient documentation

## 2024-02-29 DIAGNOSIS — Z7901 Long term (current) use of anticoagulants: Secondary | ICD-10-CM | POA: Diagnosis not present

## 2024-02-29 DIAGNOSIS — I2510 Atherosclerotic heart disease of native coronary artery without angina pectoris: Secondary | ICD-10-CM

## 2024-02-29 DIAGNOSIS — I484 Atypical atrial flutter: Secondary | ICD-10-CM

## 2024-02-29 DIAGNOSIS — I340 Nonrheumatic mitral (valve) insufficiency: Secondary | ICD-10-CM

## 2024-02-29 HISTORY — PX: RIGHT/LEFT HEART CATH AND CORONARY ANGIOGRAPHY: CATH118266

## 2024-02-29 HISTORY — PX: TRANSESOPHAGEAL ECHOCARDIOGRAM (CATH LAB): EP1270

## 2024-02-29 LAB — POCT I-STAT 7, (LYTES, BLD GAS, ICA,H+H)
Acid-base deficit: 3 mmol/L — ABNORMAL HIGH (ref 0.0–2.0)
Bicarbonate: 22.4 mmol/L (ref 20.0–28.0)
Calcium, Ion: 1.18 mmol/L (ref 1.15–1.40)
HCT: 40 % (ref 39.0–52.0)
Hemoglobin: 13.6 g/dL (ref 13.0–17.0)
O2 Saturation: 91 %
Potassium: 3.6 mmol/L (ref 3.5–5.1)
Sodium: 142 mmol/L (ref 135–145)
TCO2: 24 mmol/L (ref 22–32)
pCO2 arterial: 39.1 mmHg (ref 32–48)
pH, Arterial: 7.367 (ref 7.35–7.45)
pO2, Arterial: 63 mmHg — ABNORMAL LOW (ref 83–108)

## 2024-02-29 LAB — POCT I-STAT EG7
Acid-base deficit: 1 mmol/L (ref 0.0–2.0)
Bicarbonate: 25.1 mmol/L (ref 20.0–28.0)
Calcium, Ion: 1.24 mmol/L (ref 1.15–1.40)
HCT: 40 % (ref 39.0–52.0)
Hemoglobin: 13.6 g/dL (ref 13.0–17.0)
O2 Saturation: 68 %
Potassium: 3.8 mmol/L (ref 3.5–5.1)
Sodium: 141 mmol/L (ref 135–145)
TCO2: 26 mmol/L (ref 22–32)
pCO2, Ven: 44.6 mmHg (ref 44–60)
pH, Ven: 7.358 (ref 7.25–7.43)
pO2, Ven: 37 mmHg (ref 32–45)

## 2024-02-29 LAB — ECHO TEE

## 2024-02-29 SURGERY — TRANSESOPHAGEAL ECHOCARDIOGRAM (TEE) (CATHLAB)
Anesthesia: Monitor Anesthesia Care

## 2024-02-29 SURGERY — RIGHT/LEFT HEART CATH AND CORONARY ANGIOGRAPHY
Anesthesia: LOCAL

## 2024-02-29 MED ORDER — LABETALOL HCL 5 MG/ML IV SOLN: 10.0000 mg | INTRAVENOUS | Status: DC | PRN

## 2024-02-29 MED ORDER — MIDAZOLAM HCL 2 MG/2ML IJ SOLN
INTRAMUSCULAR | Status: AC
  Filled 2024-02-29: qty 2

## 2024-02-29 MED ORDER — HEPARIN SODIUM (PORCINE) 1000 UNIT/ML IJ SOLN
INTRAMUSCULAR | Status: AC
  Filled 2024-02-29: qty 10

## 2024-02-29 MED ORDER — HYDRALAZINE HCL 20 MG/ML IJ SOLN: 10.0000 mg | INTRAMUSCULAR | Status: DC | PRN

## 2024-02-29 MED ORDER — ASPIRIN 81 MG PO CHEW: 81.0000 mg | CHEWABLE_TABLET | ORAL | Status: DC

## 2024-02-29 MED ORDER — ACETAMINOPHEN 325 MG PO TABS: 650.0000 mg | ORAL_TABLET | ORAL | Status: DC | PRN

## 2024-02-29 MED ORDER — ONDANSETRON HCL 4 MG/2ML IJ SOLN: 4.0000 mg | Freq: Four times a day (QID) | INTRAMUSCULAR | Status: DC | PRN

## 2024-02-29 MED ORDER — LIDOCAINE 2% (20 MG/ML) 5 ML SYRINGE
INTRAMUSCULAR | Status: DC | PRN
  Administered 2024-02-29: 100 mg via INTRAVENOUS

## 2024-02-29 MED ORDER — IOHEXOL 350 MG/ML SOLN
INTRAVENOUS | Status: DC | PRN
  Administered 2024-02-29: 30 mL

## 2024-02-29 MED ORDER — APIXABAN 5 MG PO TABS: 5.0000 mg | ORAL_TABLET | Freq: Two times a day (BID) | ORAL | 0 refills | Status: AC

## 2024-02-29 MED ORDER — SODIUM CHLORIDE 0.9% FLUSH: 3.0000 mL | INTRAVENOUS | Status: DC | PRN

## 2024-02-29 MED ORDER — VERAPAMIL HCL 2.5 MG/ML IV SOLN
INTRAVENOUS | Status: DC | PRN
  Administered 2024-02-29: 10 mL via INTRA_ARTERIAL

## 2024-02-29 MED ORDER — FREE WATER: 250.0000 mL | Freq: Once | Status: DC

## 2024-02-29 MED ORDER — SODIUM CHLORIDE 0.9 % IV SOLN
INTRAVENOUS | Status: DC | PRN
Start: 2024-02-29 — End: 2024-02-29

## 2024-02-29 MED ORDER — PROPOFOL 10 MG/ML IV BOLUS
INTRAVENOUS | Status: DC | PRN
  Administered 2024-02-29 (×5): 50 mg via INTRAVENOUS

## 2024-02-29 MED ORDER — SODIUM CHLORIDE 0.9% FLUSH: 3.0000 mL | Freq: Two times a day (BID) | INTRAVENOUS | Status: DC

## 2024-02-29 MED ORDER — SODIUM CHLORIDE 0.9 % WEIGHT BASED INFUSION: 3.0000 mL/kg/h | INTRAVENOUS | Status: AC

## 2024-02-29 MED ORDER — LIDOCAINE HCL (PF) 1 % IJ SOLN
INTRAMUSCULAR | Status: AC
  Filled 2024-02-29: qty 30

## 2024-02-29 MED ORDER — LIDOCAINE HCL (PF) 1 % IJ SOLN
INTRAMUSCULAR | Status: DC | PRN
  Administered 2024-02-29 (×2): 2 mL via INTRADERMAL

## 2024-02-29 MED ORDER — VERAPAMIL HCL 2.5 MG/ML IV SOLN
INTRAVENOUS | Status: AC
  Filled 2024-02-29: qty 2

## 2024-02-29 MED ORDER — FENTANYL CITRATE (PF) 100 MCG/2ML IJ SOLN
INTRAMUSCULAR | Status: AC
  Filled 2024-02-29: qty 2

## 2024-02-29 MED ORDER — SODIUM CHLORIDE 0.9 % WEIGHT BASED INFUSION: 1.0000 mL/kg/h | INTRAVENOUS | Status: DC

## 2024-02-29 MED ORDER — HEPARIN (PORCINE) IN NACL 1000-0.9 UT/500ML-% IV SOLN
INTRAVENOUS | Status: DC | PRN
  Administered 2024-02-29 (×2): 500 mL

## 2024-02-29 MED ORDER — HEPARIN SODIUM (PORCINE) 1000 UNIT/ML IJ SOLN
INTRAMUSCULAR | Status: DC | PRN
  Administered 2024-02-29: 3500 [IU] via INTRAVENOUS

## 2024-02-29 MED ORDER — SODIUM CHLORIDE 0.9 % IV SOLN: 250.0000 mL | INTRAVENOUS | Status: DC | PRN

## 2024-02-29 SURGICAL SUPPLY — 9 items
CATH BALLN WEDGE 5F 110CM (CATHETERS) IMPLANT
CATH INFINITI AMBI 5FR TG (CATHETERS) IMPLANT
DEVICE RAD TR BAND REGULAR (VASCULAR PRODUCTS) IMPLANT
GLIDESHEATH SLEND SS 6F .021 (SHEATH) IMPLANT
GUIDEWIRE .025 260CM (WIRE) IMPLANT
GUIDEWIRE INQWIRE 1.5J.035X260 (WIRE) IMPLANT
PACK CARDIAC CATHETERIZATION (CUSTOM PROCEDURE TRAY) ×1 IMPLANT
SET ATX-X65L (MISCELLANEOUS) IMPLANT
SHEATH GLIDE SLENDER 4/5FR (SHEATH) IMPLANT

## 2024-02-29 NOTE — Anesthesia Postprocedure Evaluation (Signed)
 Anesthesia Post Note  Patient: Terry Golden.  Procedure(s) Performed: TRANSESOPHAGEAL ECHOCARDIOGRAM     Patient location during evaluation: Cath Lab Anesthesia Type: MAC Level of consciousness: awake and alert Pain management: pain level controlled Vital Signs Assessment: post-procedure vital signs reviewed and stable Respiratory status: spontaneous breathing, nonlabored ventilation and respiratory function stable Cardiovascular status: stable and blood pressure returned to baseline Postop Assessment: no apparent nausea or vomiting Anesthetic complications: no   No notable events documented.                 Gaylen Venning

## 2024-02-29 NOTE — Anesthesia Preprocedure Evaluation (Signed)
 Anesthesia Evaluation  Patient identified by MRN, date of birth, ID band Patient awake    Reviewed: Allergy & Precautions, NPO status , Patient's Chart, lab work & pertinent test results  Airway Mallampati: III  TM Distance: >3 FB Neck ROM: Full    Dental  (+) Implants, Dental Advisory Given   Pulmonary shortness of breath, Current Smoker and Patient abstained from smoking.   breath sounds clear to auscultation       Cardiovascular hypertension, Pt. on medications + CAD and +CHF   Rhythm:Regular  1. Left ventricular ejection fraction, by estimation, is 55%. Left  ventricular ejection fraction by 3D volume is 54 %. The left ventricle has  normal function. The left ventricle has no regional wall motion  abnormalities. There is mild concentric left  ventricular hypertrophy. Left ventricular diastolic parameters are  indeterminate.   2. Right ventricular systolic function is normal. The right ventricular  size is normal. There is normal pulmonary artery systolic pressure.   3. Left atrial size was severely dilated.   4. S/p primum ASD repair. No residual leak.   5. H/o primum ASA with MV cleft s/p repair. There is only one prominent  papillary muscle c/w parachute MV. The MV leaflets are thickened. There is  severe central MR. The mitral valve has been repaired/replaced. Severe  mitral valve regurgitation. No  evidence of mitral stenosis. The mean mitral valve gradient is 5.0 mmHg.  There is a present in the mitral position. Procedure Date: 03/25/1999.   6. The aortic valve is normal in structure. Aortic valve regurgitation is  not visualized. No aortic stenosis is present.   7. The inferior vena cava is normal in size with greater than 50%  respiratory variability, suggesting right atrial pressure of 3 mmHg.     Neuro/Psych negative neurological ROS  negative psych ROS   GI/Hepatic negative GI ROS, Neg liver ROS,,,   Endo/Other  negative endocrine ROS    Renal/GU negative Renal ROS     Musculoskeletal   Abdominal   Peds  Hematology Lab Results      Component                Value               Date                      WBC                      7.0                 02/09/2024                HGB                      14.8                02/09/2024                HCT                      43.5                02/09/2024                MCV                      96.7  02/09/2024                PLT                      245                 02/09/2024              Anesthesia Other Findings   Reproductive/Obstetrics                              Anesthesia Physical Anesthesia Plan  ASA: 3  Anesthesia Plan: MAC   Post-op Pain Management: Minimal or no pain anticipated   Induction: Intravenous  PONV Risk Score and Plan: 0 and Treatment may vary due to age or medical condition  Airway Management Planned: Nasal Cannula, Natural Airway and Simple Face Mask  Additional Equipment: None  Intra-op Plan:   Post-operative Plan:   Informed Consent: I have reviewed the patients History and Physical, chart, labs and discussed the procedure including the risks, benefits and alternatives for the proposed anesthesia with the patient or authorized representative who has indicated his/her understanding and acceptance.     Dental advisory given  Plan Discussed with: CRNA  Anesthesia Plan Comments:          Anesthesia Quick Evaluation

## 2024-02-29 NOTE — Progress Notes (Signed)
 Echocardiogram Echocardiogram Transesophageal has been performed.  Terry Golden 02/29/2024, 10:28 AM

## 2024-02-29 NOTE — Transfer of Care (Signed)
 Immediate Anesthesia Transfer of Care Note  Patient: Terry Golden.  Procedure(s) Performed: TRANSESOPHAGEAL ECHOCARDIOGRAM  Patient Location: Cath Lab  Anesthesia Type:MAC  Level of Consciousness: awake, alert , and oriented  Airway & Oxygen Therapy: Patient Spontanous Breathing and Patient connected to nasal cannula oxygen  Post-op Assessment: Report given to RN and Post -op Vital signs reviewed and stable  Post vital signs: Reviewed and stable  Last Vitals:  Vitals Value Taken Time  BP 133/76 02/29/24 10:04  Temp    Pulse 71 02/29/24 10:06  Resp 15 02/29/24 10:06  SpO2 97 % 02/29/24 10:06  Vitals shown include unfiled device data.  Last Pain:  Vitals:   02/29/24 0751  TempSrc: Temporal         Complications: No notable events documented.

## 2024-02-29 NOTE — Interval H&P Note (Signed)
 History and Physical Interval Note:  02/29/2024 11:29 AM  Terry Golden.  has presented today for surgery, with the diagnosis of mr.  The various methods of treatment have been discussed with the patient and family. After consideration of risks, benefits and other options for treatment, the patient has consented to  Procedure(s): RIGHT/LEFT HEART CATH AND CORONARY ANGIOGRAPHY (N/A) as a surgical intervention.  The patient's history has been reviewed, patient examined, no change in status, stable for surgery.  I have reviewed the patient's chart and labs.  Questions were answered to the patient's satisfaction.     [name]

## 2024-02-29 NOTE — Interval H&P Note (Signed)
 History and Physical Interval Note:  02/29/2024 9:04 AM  Terry Golden.  has presented today for surgery, with the diagnosis of mitral valve regurgitation.  The various methods of treatment have been discussed with the patient and family. After consideration of risks, benefits and other options for treatment, the patient has consented to  Procedure(s): TRANSESOPHAGEAL ECHOCARDIOGRAM (N/A) as a surgical intervention.  The patient's history has been reviewed, patient examined, no change in status, stable for surgery.  I have reviewed the patient's chart and labs.  Questions were answered to the patient's satisfaction.     [name]

## 2024-02-29 NOTE — CV Procedure (Signed)
 TRANSESOPHAGEAL ECHOCARDIOGRAM (TEE) NOTE  INDICATIONS: Mitral regurgitation  PROCEDURE:   Informed consent was obtained prior to the procedure. The risks, benefits and alternatives for the procedure were discussed and the patient comprehended these risks.  Risks include, but are not limited to, cough, sore throat, vomiting, nausea, somnolence, esophageal and stomach trauma or perforation, bleeding, low blood pressure, aspiration, pneumonia, infection, trauma to the teeth and death.    After a procedural time-out, the patient was given propofol  for sedation by anesthesia. See their separate report.  The patient's heart rate, blood pressure, and oxygen saturation are monitored continuously during the procedure.The oropharynx was anesthetized with topical cetacaine.  The transesophageal probe was inserted in the esophagus and stomach without difficulty and multiple views were obtained.  The patient was kept under observation until the patient left the procedure room.  I was present face-to-face 100% of this time. The patient left the procedure room in stable condition.   Agitated microbubble saline contrast was administered.  COMPLICATIONS:    There were no immediate complications.  Findings:  LEFT VENTRICLE: The left ventricular wall thickness is mildly increased.  The left ventricular cavity is normal in size. Wall motion is normal.  LVEF is 55-60%.  RIGHT VENTRICLE:  The right ventricle is normal in structure and function without any thrombus or masses.    LEFT ATRIUM:  The left atrium is severely dilated in size without any thrombus or masses.  There is not spontaneous echo contrast (smoke) in the left atrium consistent with a low flow state.  LEFT ATRIAL APPENDAGE:  The large left atrial appendage is free of any thrombus or masses. The appendage has single lobes. Pulse doppler indicates moderate flow in the appendage.  ATRIAL SEPTUM:  The atrial septum has been previously  repaired (primum ASD).  There is no evidence for interatrial shunting by color doppler and saline microbubble.  RIGHT ATRIUM:  The right atrium is normal in size and function without any thrombus or masses.  MITRAL VALVE:  The parachute mitral valve was previously identified as a cleft valve and has been repaired. There is thickening of both leaflets and the chordae with leaflet restriction as well as mitral annular dilatation. Severe central regurgitation is noted.  There were no vegetations.  There is no significant mitral stenosis. Visualized in 2D/3D modes.  AORTIC VALVE:  The aortic valve is trileaflet, normal in structure and function with no regurgitation.  There were no vegetations or stenosis  TRICUSPID VALVE:  The tricuspid valve is normal in structure and function with Mild eccentric regurgitation.  There were no vegetations or stenosis   PULMONIC VALVE:  The pulmonic valve is normal in structure and function with no regurgitation.  There were no vegetations or stenosis.   AORTIC ARCH, ASCENDING AND DESCENDING AORTA:  There was no Shaune et. Al, 1992) atherosclerosis of the ascending aorta, aortic arch, or proximal descending aorta.  12. PULMONARY VEINS: Anomalous pulmonary venous return was not noted.  13. PERICARDIUM: The pericardium appeared normal and non-thickened.  There is no pericardial effusion.  IMPRESSION:   Parachute mitral valve which was cleft, s/p repair, now with severe central MR d/t leaflet restriction and mitral annular dilitation. No LAA thombus Negative for atrial level shunting (intact prior primum ASD repair) Mild eccentric TR Mild LVH Severe LAE LVEF 55-60%  RECOMMENDATIONS:    Severe central mitral regurgitation of a previously repaired cleft parachute mitral valve.  Agree with further workup and evaluation by CT surgery for mitral  valve replacement.  Time Spent Directly with the Patient:  60 minutes   Vinie KYM Maxcy, MD, [facility], FNLA, FACP   [place]  [facility]  Medical Director of the [facility] Lipid Disorders &  Cardiovascular Risk Reduction Clinic Diplomate of the American Board of Clinical Lipidology Attending Cardiologist  Direct Dial: [phone]  Fax: [phone]  Website:  [email or web address]  Vinie BROCKS Alvaro Aungst 02/29/2024, 9:59 AM

## 2024-03-01 ENCOUNTER — Encounter (HOSPITAL_COMMUNITY): Payer: Self-pay | Admitting: Internal Medicine

## 2024-03-03 ENCOUNTER — Other Ambulatory Visit: Payer: Self-pay

## 2024-03-03 ENCOUNTER — Telehealth: Payer: Self-pay | Admitting: Cardiology

## 2024-03-03 MED ORDER — ROSUVASTATIN CALCIUM 20 MG PO TABS: 20.0000 mg | ORAL_TABLET | Freq: Every day | ORAL | 3 refills | Status: AC

## 2024-03-03 NOTE — Telephone Encounter (Signed)
 Patient states prior to his heart cath he had a lipoma removed by Dr. Rubin for which he was taking Tramadol  for pain management. Patient states this medication was stopped prior to his heart cath and he would like Dr. Elmira to prescribe more tramadol  as he is having pain in his arm.   Patient denies any signs or symptoms of infection at cath site, he states pain is from lipoma excision and having had cath performed on same arm.  Informed patient our cardiologists do not typically prescribe pain medication and this is usually ordered through PCP. Patient states he has reach out to his PCP as well but cannot get in touch with anyone and is calling all of his providers.  Will forward message to Dr. Elmira to see if he would be willing to prescribe Tramadol  short-term for patient, or any other recommendations.

## 2024-03-03 NOTE — Telephone Encounter (Signed)
 Tramadol  not prescribed by me before. If this was prescribed by Dr. Rubin for lipoma, I would defer this to Dr. Rubin or his PCP. I am not comfortable prescribing tramadol  for noncardiac pain.  Thanks MJP

## 2024-03-03 NOTE — Telephone Encounter (Signed)
 Pt c/o medication issue:  1. Name of Medication: traMADol  (ULTRAM ) 50 MG tablet   2. How are you currently taking this medication (dosage and times per day)? Not taking  3. Are you having a reaction (difficulty breathing--STAT)? no  4. What is your medication issue? Patient states that Dr. Elmira took him off of this medication prior to his cath and he is wanting to know if he can start taking again and have a refill sent in. He says that he is very sore and needs to take it. Please advise.

## 2024-03-06 NOTE — Telephone Encounter (Signed)
 Left message to call back

## 2024-03-08 NOTE — Telephone Encounter (Signed)
 Pt called back in about this request. Informed him to please reach out to Dr. Rubin or PCP.

## 2024-03-10 NOTE — Telephone Encounter (Signed)
 Pt is calling in again asking for tramadol . I told him what the message said. He said he called Dr. Rubin and he would not fill it. Pt is saying the pain is in his arm from the cath procedure and wants to know if there is something else Dr Elmira can call in for the pain or does he need to go back to ER.

## 2024-03-10 NOTE — Telephone Encounter (Signed)
 Patient states he has been having pain and numbness in his arm since before his heart cath, but it has continued and not gotten any better. He is asking for tramadol  or other pain medication.  Advised patient to have his arm evaluated at an urgent care or the ED. Patient verbalized understanding and states he will go have his arm evaluated today.

## 2024-03-13 ENCOUNTER — Ambulatory Visit: Attending: Cardiology | Admitting: Cardiology

## 2024-03-13 ENCOUNTER — Encounter: Payer: Self-pay | Admitting: Cardiology

## 2024-03-13 VITALS — BP 126/64 | HR 44 | Ht 69.0 in | Wt 147.1 lb

## 2024-03-13 DIAGNOSIS — I340 Nonrheumatic mitral (valve) insufficiency: Secondary | ICD-10-CM | POA: Insufficient documentation

## 2024-03-13 DIAGNOSIS — Q249 Congenital malformation of heart, unspecified: Secondary | ICD-10-CM | POA: Insufficient documentation

## 2024-03-13 DIAGNOSIS — M5412 Radiculopathy, cervical region: Secondary | ICD-10-CM | POA: Diagnosis present

## 2024-03-13 DIAGNOSIS — I5020 Unspecified systolic (congestive) heart failure: Secondary | ICD-10-CM | POA: Diagnosis not present

## 2024-03-13 DIAGNOSIS — I10 Essential (primary) hypertension: Secondary | ICD-10-CM | POA: Diagnosis present

## 2024-03-13 NOTE — Patient Instructions (Signed)
 Referral to ortho   Follow-Up: At [facility], you and your health needs are our priority.  As part of our continuing mission to provide you with exceptional heart care, our providers are all part of one team.  This team includes your primary Cardiologist (physician) and Advanced Practice Providers or APPs (Physician Assistants and Nurse Practitioners) who all work together to provide you with the care you need, when you need it.  Your next appointment:   3 month(s)  Provider:   Newman JINNY Lawrence, MD

## 2024-03-13 NOTE — Progress Notes (Signed)
 Cardiology Office Note:  .   Date:  03/13/2024  ID:  Carlin LOISE Izell Mickey., DOB [birth year], MRN [id number] PCP: Dann Candyce RAMAN, MD  [facility] Providers Cardiologist:  Newman Lawrence, MD PCP: Dann Candyce RAMAN, MD  Chief Complaint  Patient presents with   Follow Up Arm Cath.     Gabe Glace. is a 58 y.o. 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  He is awaiting consultation with Dr. Lucas for possible mitral valve replacement.  He is here for his post cath follow-up visit.  He has been complaining of numbness and pain in his right arm.  However, on further questioning, it appears that the symptoms have predated recent cardiac catheterization procedure.  There is no swelling in his arm.  Pain starts from right arm, radiates down to his fingers.  He tells me that symptoms started around the time of his lipoma surgery, and have persisted since then.    Vitals:   03/13/24 0814  BP: 126/64  Pulse: (!) 44  SpO2: 96%      Review of Systems  Cardiovascular:  Positive for dyspnea on exertion. Negative for chest pain, leg swelling, palpitations and syncope.  Neurological:  Positive for dizziness and light-headedness.        Studies Reviewed: SABRA        EKG 03/13/2024: Marked sinus bradycardia with marked sinus arrhythmia Left anterior fascicular block Left ventricular hypertrophy with repolarization abnormality ( R in aVL , Cornell product ) When compared with ECG of 09-Feb-2024 08:41, Premature ventricular complexes are no longer Present Premature atrial complexes are no longer Present Vent. rate has decreased BY  35 BPM QT has shortened  Coronary angiography 02/29/2024: LM: Normal LAD: Mid 30%, diag 2 40% disease Ramus: Small, no  significant disease Lcx: Mild diffuse disease        Small caliber OM1 with ostial 70% disease RCA: Tortuous vessel with mild diffuse disease   LVEDP 18 mmHg   Right heart catheterization 02/29/2024: RA: 8 mmHg RV: 37/3 mmHg PA: 35/19 mmHg, mPAP 26 mmHg PCW: 11-15 mmHg   AO sats: 91% PA sats: 68%   CO: 5.1 L/min CI: 2.8 L/min/m2   Conclusion: Mild nonobstructive coronary artery disease Mild pulmonary hypertension, WHO Grp II       Labs 02/2024: Hb 14.8 Cr 0.93 Trop HS 7  04/2022: Chol 161, TG 59, HDL 52, LDL 97 TSH 1.7  Echocardiogram 01/2024:  1. Left ventricular ejection fraction, by estimation, is 55%. Left  ventricular ejection fraction by 3D volume is 54 %. The left ventricle has  normal function. The left ventricle has no regional wall motion  abnormalities. There is mild concentric left  ventricular hypertrophy. Left ventricular diastolic parameters are  indeterminate.   2. Right ventricular systolic function is normal. The right ventricular  size is normal. There is normal pulmonary artery systolic pressure.   3. Left atrial size was severely dilated.   4. S/p primum ASD repair. No residual leak.   5. H/o primum ASA with MV cleft s/p repair. There is only one prominent  papillary muscle c/w parachute MV. The MV leaflets are thickened. There is  severe central MR. The mitral valve has been repaired/replaced. Severe  mitral valve regurgitation. No evidence of mitral stenosis. The mean mitral valve gradient  is 5.0 mmHg. There is a present in the mitral position. Procedure Date: 03/25/1999.   6. The aortic valve is normal in structure. Aortic valve regurgitation is  not visualized. No aortic stenosis is present.   7. The inferior vena cava is normal in size with greater than 50%  respiratory variability, suggesting right atrial pressure of 3 mmHg.   Echocardiogram 01/2023:  1. Left ventricular ejection fraction, by estimation, is 55 to 60%. The  left ventricle  has normal function. The left ventricle has no regional  wall motion abnormalities. There is mild concentric left ventricular  hypertrophy. Left ventricular diastolic  parameters are consistent with Grade I diastolic dysfunction (impaired  relaxation).   2. Right ventricular systolic function is normal. The right ventricular  size is normal.   3. Left atrial size was severely dilated. Hx of primum ASD repair.   4. History of MV cleft s/p repair. There is severe centrally directed  mitral regurgitation. As noted previously, only one prominent papillary  muslce visualized consistent with parachute mitral valve.   5. The aortic valve is normal in structure. Aortic valve regurgitation is  not visualized. No aortic stenosis is present.   6. The inferior vena cava is normal in size with greater than 50%  respiratory variability, suggesting right atrial pressure of 3 mmHg.   [facility] 01/2023:   Mid LAD lesion is 30% stenosed.   2nd Diag lesion is 40% stenosed.   LV end diastolic pressure is normal.   There is no aortic valve stenosis.   Recommend to resume Apixaban , at currently prescribed dose and frequency on 01/07/2023.   Concurrent antiplatelet therapy not recommended.   Aortic saturation 91%, PA saturation 66%, both on room air; mean RA pressure 3 mmHg, PA pressure 28/11, mean PA pressure 17 mmHg, pulmonary capillary wedge pressure 8/14, mean pulmonary capillary wedge pressure 10 mmHg, cardiac output 4.87 L/min, cardiac index 2.74.  V waves noted on the wedge tracing.   Mild, nonobstructive coronary artery disease.  Continue aggressive secondary prevention.  Normal right heart pressures.   Plan for echocardiogram to further evaluate mitral regurgitation.  No evidence of congestive heart failure or volume overload by right heart catheterization.  Risk Assessment/Calculations:    CHA2DS2-VASc Score = 2  This indicates a 2.2% annual risk of stroke. The patient's score is based upon: CHF  History: 1 HTN History: 1 Diabetes History: 0 Stroke History: 0 Vascular Disease History: 0 Age Score: 0 Gender Score: 0     Physical Exam Vitals and nursing note reviewed.  Constitutional:      General: He is not in acute distress. Neck:     Vascular: No JVD.  Cardiovascular:     Rate and Rhythm: Normal rate and regular rhythm. Frequent Extrasystoles are present.    Heart sounds: Murmur heard.     High-pitched blowing holosystolic murmur is present with a grade of 3/6 at the apex.  Pulmonary:     Effort: Pulmonary effort is normal.     Breath sounds: Normal breath sounds. No wheezing or rales.  Musculoskeletal:     Right lower leg: No edema.     Left lower leg: No edema.      VISIT DIAGNOSES:   ICD-10-CM   1. Congenital heart disease  Q24.9 EKG 12-Lead    2. Nonrheumatic mitral valve regurgitation  I34.0     3. Essential hypertension  I10 EKG 12-Lead    4. HFrEF (heart failure with reduced ejection fraction) (HCC)  I50.20  EKG 12-Lead    5. Cervical radiculopathy  M54.12 Ambulatory referral to Orthopedic Surgery       Carlin LOISE Izell Mickey. is a 58 y.o. 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 Severe mitral regurgitation confirmed on recent TEE 03/2024, mild nonobstructive coronary artery disease on cardiac catheterization. Continue metoprolol  succinate 100 mg daily, Entresto  24-26 mg twice daily, Farxiga  10 mg daily. Keep follow-up with CVTS and Pharm.D. for GDMT up titration.  Right arm pain: No hematoma, ecchymosis.  I do not see any periprocedural complication from heart catheterization to explain patient's symptoms.  Symptoms predate recent cardiac catheterization procedure.  I strongly suspect that the  symptoms could be related to cervical spine nerve impingement.  Refer to orthopedic.  Paroxysmal atrial flutter: Technically, he may not need anticoagulation if only having atrial flutter and been anticoagulation at least 4 weeks after.  However, I suspect he may be having more arrhythmia than atrial flutter alone, including A-fib.  He has been on anticoagulation for a long time and I will continue the same with Eliquis  5 mg twice daily.   F/u in 3 months  Signed, Newman JINNY Lawrence, MD

## 2024-03-14 ENCOUNTER — Ambulatory Visit: Admitting: Physician Assistant

## 2024-03-29 ENCOUNTER — Ambulatory Visit: Admitting: Surgery

## 2024-03-30 ENCOUNTER — Ambulatory Visit (INDEPENDENT_AMBULATORY_CARE_PROVIDER_SITE_OTHER): Admitting: Physical Medicine and Rehabilitation

## 2024-03-30 ENCOUNTER — Other Ambulatory Visit (INDEPENDENT_AMBULATORY_CARE_PROVIDER_SITE_OTHER): Payer: Self-pay

## 2024-03-30 ENCOUNTER — Encounter: Payer: Self-pay | Admitting: Physical Medicine and Rehabilitation

## 2024-03-30 DIAGNOSIS — M542 Cervicalgia: Secondary | ICD-10-CM

## 2024-03-30 DIAGNOSIS — M7918 Myalgia, other site: Secondary | ICD-10-CM | POA: Diagnosis not present

## 2024-03-30 DIAGNOSIS — M5412 Radiculopathy, cervical region: Secondary | ICD-10-CM | POA: Diagnosis not present

## 2024-03-30 MED ORDER — MELOXICAM 15 MG PO TABS: 15.0000 mg | ORAL_TABLET | Freq: Every day | ORAL | 0 refills | Status: AC

## 2024-03-30 MED ORDER — CYCLOBENZAPRINE HCL 10 MG PO TABS
10.0000 mg | ORAL_TABLET | Freq: Every day | ORAL | 0 refills | Status: AC
Start: 2024-03-30 — End: ?

## 2024-03-30 NOTE — Progress Notes (Signed)
 Pain Scale   Average Pain 8 Patient advising he has neck pain radiating to right shoulder and causing his right had to have numbness and tingling.        +Driver, -BT, -Dye Allergies.

## 2024-03-30 NOTE — Progress Notes (Signed)
 Terry Golden. - 58 y.o. male MRN [id number]  Date of birth: 1966/07/17  Office Visit Note: Visit Date: 03/30/2024 PCP: Dann Candyce RAMAN, MD Referred by: Elmira Newman PARAS, MD  Subjective: Chief Complaint  Patient presents with   Neck - Pain   HPI: Hawthorne Day. is a 58 y.o. male who comes in today per the request of Dr. Newman Elmira for evaluation of acute right sided neck pain radiating to shoulder and down arm to index and middle fingers. Symptoms started about 1 month ago, worsens with activity and laying on right side. He describes pain as sore and tight sensation, currently rates as 8 out of 10. Some relief of pain with home exercise regimen, rest and use of medications. No history of formal physical therapy. History of right shoulder lipoma excision in January of this year. He denies issues after lipoma excision, his current symptoms are fairly new. He recently underwent cardiac cath on 02/29/2024, states these symptoms were present prior to procedure. Patient denies focal weakness. No recent trauma or falls.   He has significant cardiac history including atrial fibrillation (taking Eliquis ), CHF and mitral valve disease.      Review of Systems  Musculoskeletal:  Positive for myalgias and neck pain.  Neurological:  Positive for tingling. Negative for focal weakness and weakness.  All other systems reviewed and are negative.  Otherwise per HPI.  Assessment & Plan: Visit Diagnoses:    ICD-10-CM   1. Radiculopathy, cervical region  M54.12 XR Cervical Spine 2 or 3 views    2. Myofascial pain syndrome  M79.18        Plan: Findings:  Acute right sided neck pain radiating to shoulder and down arm. Paresthesias to right hand/fingers, specifically index and middle fingers.  Patient continues to have pain despite good conservative therapy such as home exercise regimen, rest and use of medications.  Patient's clinical presentation and exam are complex.   Differentials include cervical radiculopathy, more of a C6 nerve pattern, carpal tunnel syndrome and myofascial pain syndrome.  Given his acute onset of symptoms and reassuring physical exam findings I would like to start with a short course of formal physical therapy.  I do think he would benefit from manual treatments and possible dry needling as there are multiple palpable trigger points noted to right levator scapula and trapezius region on exam today.  Also discussed medication management and prescribed short course of meloxicam  and flexeril . I obtained cervical x-rays in the office today that show mild leftward curvature, facet arthropathy to upper cervical spine, worse on the left, no spondylolisthesis. I would like to see him back in approximately 6 weeks for re-evaluation. He has no questions at this time. No red flag symptoms noted upon exam today.        Meds & Orders:  Meds ordered this encounter  Medications   meloxicam  (MOBIC ) 15 MG tablet    Sig: Take 1 tablet (15 mg total) by mouth daily.    Dispense:  30 tablet    Refill:  0   cyclobenzaprine  (FLEXERIL ) 10 MG tablet    Sig: Take 1 tablet (10 mg total) by mouth at bedtime.    Dispense:  30 tablet    Refill:  0    Orders Placed This Encounter  Procedures   XR Cervical Spine 2 or 3 views    Follow-up: Return for 6 week follow up post physical therapy.   Procedures: No procedures performed  Clinical History: No specialty comments available.   He reports that he has been smoking cigarettes. He has a 10.5 pack-year smoking history. He has never used smokeless tobacco. No results for input(s): HGBA1C, LABURIC in the last 8760 hours.  Objective:  VS:  HT:    WT:   BMI:     BP:   HR: bpm  TEMP: ( )  RESP:  Physical Exam Vitals and nursing note reviewed.  HENT:     Head: Normocephalic and atraumatic.     Right Ear: External ear normal.     Left Ear: External ear normal.     Nose: Nose normal.      Mouth/Throat:     Mouth: Mucous membranes are moist.  Eyes:     Extraocular Movements: Extraocular movements intact.  Cardiovascular:     Rate and Rhythm: Normal rate.     Pulses: Normal pulses.  Pulmonary:     Effort: Pulmonary effort is normal.  Abdominal:     General: Abdomen is flat. There is no distension.  Musculoskeletal:        General: Tenderness present.     Cervical back: Tenderness present.     Comments: Discomfort noted with flexion and extension. Patient has good strength in the upper extremities including 5 out of 5 strength in wrist extension, long finger flexion and APB. Shoulder range of motion is full bilaterally without any sign of impingement. There is no atrophy of the hands intrinsically. Sensation intact bilaterally. Myofascial tenderness noted upon palpation of right levator scapulae and trapezius regions. Negative Hoffman's sign. Negative Spurling's sign.     Skin:    General: Skin is warm and dry.     Capillary Refill: Capillary refill takes less than 2 seconds.  Neurological:     General: No focal deficit present.     Mental Status: He is alert and oriented to person, place, and time.  Psychiatric:        Mood and Affect: Mood normal.        Behavior: Behavior normal.     Ortho Exam  Imaging: No results found.   Past Medical/Family/Surgical/Social History: Medications & Allergies reviewed per EMR, new medications updated. Patient Active Problem List   Diagnosis Date Noted   Nonrheumatic mitral valve regurgitation 03/13/2024   HFrEF (heart failure with reduced ejection fraction) (HCC) 03/13/2024   Cervical radiculopathy 03/13/2024   Essential hypertension 06/30/2022   Absolute anemia    Gastritis and gastroduodenitis    Symptomatic anemia 12/14/2020   Secondary hypercoagulable state (HCC) 10/23/2019   Atypical atrial flutter (HCC)    Chronic diastolic CHF (congestive heart failure) (HCC)    Atrial fibrillation with RVR (HCC) 12/04/2018    Mitral valve disorder    Shortness of breath 07/16/2015   Dyspnea 07/16/2015   Positional lightheadedness 07/16/2015   Pain in the chest    Weight loss 03/17/2012   Smoker 03/17/2012   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   ASD (atrial septal defect)    VSD (ventricular septal defect)    Mitral valve disease    Syncope    Congenital heart disease    Preventative health care 03/11/2012   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   right   Mild CAD    Mitral valve disease  03/25/1999   s/p mitral valve repair with commissuroplasty    NICM (nonischemic cardiomyopathy) (HCC)    EF 40% in 2020, recovered   Rheumatic fever    Syncope    1999   Typical atrial flutter (HCC)    VSD (ventricular septal defect)    small   Family History  Problem Relation Age of Onset   Diabetes Sister    Mental illness Sister    Heart disease Sister    Arthritis Other    Hypertension Other    Alcohol abuse Other    Heart attack Father    Stroke Father    Heart disease Other    Arthritis Other    Heart disease Other    Stroke Other    Mental illness Other    Kidney disease Other    Breast cancer Other        aunts   Colon cancer Neg Hx    Colon polyps Neg Hx    Esophageal cancer Neg Hx    Rectal cancer Neg Hx    Stomach cancer Neg Hx    Past Surgical History:  Procedure Laterality Date   ASD AND VSD REPAIR     BIOPSY  12/16/2020   Procedure: BIOPSY;  Surgeon: Avram Lupita BRAVO, MD;  Location: [facility];  Service: Gastroenterology;;   CARDIAC CATHETERIZATION N/A 09/20/2015   Procedure: Right/Left Heart Cath and Coronary Angiography;  Surgeon: Candyce GORMAN Reek, MD;  Location: [facility] INVASIVE CV LAB;  Service: Cardiovascular;  Laterality: N/A;   CARDIAC SURGERY     s/p partial AVSD repair and cleft mitral valve repair with commissuroplasty    CARDIOVERSION N/A 12/07/2018   Procedure: CARDIOVERSION;  Surgeon: Maranda Leim DEL, MD;  Location: [facility] ENDOSCOPY;  Service: Cardiovascular;  Laterality: N/A;   COLONOSCOPY  07/08/2020   ESOPHAGOGASTRODUODENOSCOPY (EGD) WITH PROPOFOL  N/A 12/16/2020   Procedure: ESOPHAGOGASTRODUODENOSCOPY (EGD) WITH PROPOFOL ;  Surgeon: Avram Lupita BRAVO, MD;  Location: [facility]COPY;  Service: Gastroenterology;  Laterality: N/A;   LIPOMA EXCISION Right 06/19/2020   Procedure: EXCISION SUBCUTANEOUS LIPOMA RIGHT SHOULDER;  Surgeon: Belinda Cough, MD;  Location: [facility] OR;  Service: General;  Laterality: Right;   LIPOMA EXCISION Right 08/16/2023   Procedure: EXCISION OF RIGHT SHOULDER LIPOMA;  Surgeon: Rubin Calamity, MD;  Location: [facility] OR;  Service: General;  Laterality: Right;   MITRAL VALVE REPAIR     RIGHT/LEFT HEART CATH AND CORONARY ANGIOGRAPHY N/A 01/06/2023   Procedure: RIGHT/LEFT HEART CATH AND CORONARY ANGIOGRAPHY;  Surgeon: Reek Candyce GORMAN, MD;  Location: [facility] INVASIVE CV LAB;  Service: Cardiovascular;  Laterality: N/A;   RIGHT/LEFT HEART CATH AND CORONARY ANGIOGRAPHY N/A 02/29/2024   Procedure: RIGHT/LEFT HEART CATH AND CORONARY ANGIOGRAPHY;  Surgeon: Elmira Newman PARAS, MD;  Location: [facility] INVASIVE CV LAB;  Service: Cardiovascular;  Laterality: N/A;   TEE WITHOUT CARDIOVERSION N/A 12/07/2018   Procedure: TRANSESOPHAGEAL ECHOCARDIOGRAM (TEE);  Surgeon: Maranda Leim DEL, MD;  Location: [facility] ENDOSCOPY;  Service: Cardiovascular;  Laterality: N/A;   TEE WITHOUT CARDIOVERSION N/A 01/08/2022   Procedure: TRANSESOPHAGEAL ECHOCARDIOGRAM (TEE);  Surgeon: Hobart Powell BRAVO, MD;  Location: [facility] ENDOSCOPY;  Service: Cardiovascular;  Laterality: N/A;   TRANSESOPHAGEAL ECHOCARDIOGRAM (CATH LAB) N/A 02/29/2024   Procedure: TRANSESOPHAGEAL ECHOCARDIOGRAM;  Surgeon: Mona Vinie BROCKS, MD;  Location: [facility] INVASIVE CV LAB;  Service: Cardiovascular;  Laterality: N/A;   Social History   Occupational History   Not on file   Tobacco Use   Smoking status: Every Day    Current packs/day: 0.25  Average packs/day: 0.3 packs/day for 42.0 years (10.5 ttl pk-yrs)    Types: Cigarettes   Smokeless tobacco: Never   Tobacco comments:    3 cigarettes per day( 07/08/20)  Vaping Use   Vaping status: Never Used  Substance and Sexual Activity   Alcohol use: Never    Alcohol/week: 0.0 standard drinks of alcohol   Drug use: No   Sexual activity: Not on file

## 2024-04-10 ENCOUNTER — Ambulatory Visit: Attending: Cardiovascular Disease | Admitting: Pharmacist Clinician (PhC)/ Clinical Pharmacy Specialist

## 2024-04-10 NOTE — Progress Notes (Deleted)
 Office Visit    Patient Name: Terry Golden. Date of Encounter: 04/10/2024  Primary Care Provider:  Dann Candyce RAMAN, MD Primary Cardiologist:  Newman JINNY Lawrence, MD  Chief Complaint    Heart Failure Medication Titration - EF 55-60% (by echo 6/24)  Significant Past Medical History   HFmrEF Improved to 55-60% from 2023                Allergies  Allergen Reactions   Tensilon [Edrophonium] Other (See Comments)    Blacked out     History of Present Illness    Terry Golden. is a 58 y.o. male patient of Dr ***,  Blood Pressure Goal:  130/80  GDMT: ACEI/ARB/ARNI [] Yes [] No   Beta blocker [] Yes [] No   MRA [] Yes [] No   SGLT2 inhibitor [] Yes [] No     Previously tried:    Family Hx:     Social Hx:      Tobacco:  Alcohol:  Caffeine: Diet:      Exercise:   Home BP readings:      Adherence Assessment  Do you ever forget to take your medication? [] Yes [] No  Do you ever skip doses due to side effects? [] Yes [] No  Do you have trouble affording your medicines? [] Yes [] No  Are you ever unable to pick up your medication due to transportation difficulties? [] Yes [] No  Do you ever stop taking your medications because you don't believe they are helping? [] Yes [] No  Do you check your weight daily? [] Yes [] No   Adherence strategy: ***  Barriers to obtaining medications: ***  Accessory Clinical Findings    Lab Results  Component Value Date   CREATININE 0.93 02/09/2024   BUN 7 02/09/2024   NA 142 02/29/2024   K 3.6 02/29/2024   CL 106 02/09/2024   CO2 23 02/09/2024   Lab Results  Component Value Date   ALT 9 02/09/2024   AST 16 02/09/2024   ALKPHOS 53 02/09/2024   BILITOT 0.7 02/09/2024   Lab Results  Component Value Date   HGBA1C 5.1 12/04/2018    Home Medications/Allergies    Current Outpatient Medications  Medication Sig Dispense Refill   amoxicillin  (AMOXIL ) 500 MG capsule Take 4 capsules by mouth 30-60 minutes prior  to dental work 4 capsule 3   apixaban  (ELIQUIS ) 5 MG TABS tablet Take 1 tablet (5 mg total) by mouth 2 (two) times daily. Resume on 02/29/2024 evening 1 tablet 0   aspirin -acetaminophen -caffeine (EXCEDRIN MIGRAINE) 250-250-65 MG tablet Take 1 tablet by mouth daily as needed for headache.     cyclobenzaprine  (FLEXERIL ) 10 MG tablet Take 1 tablet (10 mg total) by mouth at bedtime. 30 tablet 0   dapagliflozin  propanediol (FARXIGA ) 10 MG TABS tablet Take 1 tablet (10 mg total) by mouth daily before breakfast. 90 tablet 3   ferrous sulfate  325 (65 FE) MG tablet TAKE 1 TABLET BY MOUTH EVERY DAY WITH BREAKFAST 90 tablet 2   gabapentin  (NEURONTIN ) 100 MG capsule Take 1 capsule (100 mg total) by mouth 3 (three) times daily as needed. 20 capsule 0   meloxicam  (MOBIC ) 15 MG tablet Take 1 tablet (15 mg total) by mouth daily. 30 tablet 0   metoprolol  succinate (TOPROL -XL) 100 MG 24 hr tablet Take 1 tablet (100 mg total) by mouth at bedtime. Take with or immediately following a meal. 90 tablet 3   rosuvastatin  (CRESTOR ) 20 MG tablet Take 1 tablet (20 mg total) by mouth daily. 90 tablet  3   sacubitril -valsartan  (ENTRESTO ) 24-26 MG Take 1 tablet by mouth 2 (two) times daily. 180 tablet 3   traMADol  (ULTRAM ) 50 MG tablet Take 1 tablet (50 mg total) by mouth every 6 (six) hours as needed. (Patient not taking: Reported on 03/13/2024) 20 tablet 0   No current facility-administered medications for this visit.     Allergies  Allergen Reactions   Tensilon [Edrophonium] Other (See Comments)    Blacked out        Assessment & Plan   No BP recorded.  {Refresh Note OR Click here to enter BP  :1}***  No problem-specific Assessment & Plan notes found for this encounter.   Anvi Mangal PharmD CPP [facility]  [address] [place], [place] [address] [phone]

## 2024-05-03 ENCOUNTER — Encounter: Payer: Self-pay | Admitting: Surgery

## 2024-05-03 ENCOUNTER — Ambulatory Visit: Admitting: Surgery

## 2024-05-12 ENCOUNTER — Ambulatory Visit: Admitting: Physical Medicine and Rehabilitation

## 2024-05-29 NOTE — Progress Notes (Unsigned)
 Office Visit    Patient Name: Terry Golden. Date of Encounter: 05/30/2024  Primary Care Provider:  Dann Candyce RAMAN, MD Primary Cardiologist:  Newman JINNY Lawrence, MD  Chief Complaint    Heart Failure Medication Titration - EF 55% (by echo 01/25/2024)  Significant Past Medical History                    Allergies  Allergen Reactions   Tensilon [Edrophonium] Other (See Comments)    Blacked out     History of Present Illness    Terry Golden. is a 59 y.o. male patient of Dr Dann,  Blood Pressure Goal:  130/80 mmHg  GDMT: ACEI/ARB/ARNI [x] Yes [] No Sacubitril -valsartan  (ENTRESTO ) 24-26 mg Take 1 PO BID  Beta blocker [x] Yes [] No Metoprolol  succinate (TOPROL -XL) 100 mg Take 1 at bedtime with meal  MRA [] Yes [x] No   SGLT2 inhibitor [x] Yes [] No Dapagliflozin  (FARXIGA ) 10 mg Take 1 PO daily before breakfast    Previously tried:  Lisinopril  - switched to ENTRESTO   Family Hx:    Mother: Father:  Sister: Brother:  Social Hx:      Tobacco:  Alcohol:  Caffeine: Diet:      Exercise:   Home BP readings:      Adherence Assessment  Do you ever forget to take your medication? [] Yes [] No  Do you ever skip doses due to side effects? [] Yes [] No  Do you have trouble affording your medicines? [] Yes [] No  Are you ever unable to pick up your medication due to transportation difficulties? [] Yes [] No  Do you ever stop taking your medications because you don't believe they are helping? [] Yes [] No  Do you check your weight daily? [] Yes [] No   Adherence strategy: ***  Barriers to obtaining medications: ***  Accessory Clinical Findings    Lab Results  Component Value Date   CREATININE 0.93 02/09/2024   BUN 7 02/09/2024   NA 142 02/29/2024   K 3.6 02/29/2024   CL 106 02/09/2024   CO2 23 02/09/2024   Lab Results  Component Value Date   ALT 9 02/09/2024   AST 16 02/09/2024   ALKPHOS 53 02/09/2024   BILITOT 0.7 02/09/2024   Lab  Results  Component Value Date   HGBA1C 5.1 12/04/2018    Home Medications/Allergies    Current Outpatient Medications  Medication Sig Dispense Refill   amoxicillin  (AMOXIL ) 500 MG capsule Take 4 capsules by mouth 30-60 minutes prior to dental work 4 capsule 3   apixaban  (ELIQUIS ) 5 MG TABS tablet Take 1 tablet (5 mg total) by mouth 2 (two) times daily. Resume on 02/29/2024 evening 1 tablet 0   aspirin -acetaminophen -caffeine (EXCEDRIN MIGRAINE) 250-250-65 MG tablet Take 1 tablet by mouth daily as needed for headache.     cyclobenzaprine  (FLEXERIL ) 10 MG tablet Take 1 tablet (10 mg total) by mouth at bedtime. 30 tablet 0   dapagliflozin  propanediol (FARXIGA ) 10 MG TABS tablet Take 1 tablet (10 mg total) by mouth daily before breakfast. 90 tablet 3   ferrous sulfate  325 (65 FE) MG tablet TAKE 1 TABLET BY MOUTH EVERY DAY WITH BREAKFAST 90 tablet 2   gabapentin  (NEURONTIN ) 100 MG capsule Take 1 capsule (100 mg total) by mouth 3 (three) times daily as needed. 20 capsule 0   meloxicam  (MOBIC ) 15 MG tablet Take 1 tablet (15 mg total) by mouth daily. 30 tablet 0   metoprolol  succinate (TOPROL -XL) 100 MG 24 hr tablet Take 1 tablet (100 mg total)  by mouth at bedtime. Take with or immediately following a meal. 90 tablet 3   rosuvastatin  (CRESTOR ) 20 MG tablet Take 1 tablet (20 mg total) by mouth daily. 90 tablet 3   sacubitril -valsartan  (ENTRESTO ) 24-26 MG Take 1 tablet by mouth 2 (two) times daily. 180 tablet 3   traMADol  (ULTRAM ) 50 MG tablet Take 1 tablet (50 mg total) by mouth every 6 (six) hours as needed. (Patient not taking: Reported on 03/13/2024) 20 tablet 0   No current facility-administered medications for this visit.     Allergies  Allergen Reactions   Tensilon [Edrophonium] Other (See Comments)    Blacked out        Assessment & Plan   No BP recorded.  {Refresh Note OR Click here to enter BP  :1}***  No problem-specific Assessment & Plan notes found for this encounter.   Shirlee GORMAN Langdon PharmD Student [facility]  [address] [place], [place] [address] [phone]

## 2024-05-30 ENCOUNTER — Ambulatory Visit
Attending: Pharmacist Clinician (PhC)/ Clinical Pharmacy Specialist | Admitting: Pharmacist Clinician (PhC)/ Clinical Pharmacy Specialist

## 2024-06-05 ENCOUNTER — Encounter: Payer: Self-pay | Admitting: Radiology

## 2024-06-14 ENCOUNTER — Ambulatory Visit: Attending: Student in an Organized Health Care Education/Training Program | Admitting: Cardiology
